# Patient Record
Sex: Female | Born: 1945 | Race: White | Hispanic: No | Marital: Married | Smoking: Former smoker
Health system: Southern US, Community
[De-identification: ages and names within clinical notes are randomized; demographics above are authoritative.]

## PROBLEM LIST (undated history)

## (undated) DIAGNOSIS — R000 Tachycardia, unspecified: Secondary | ICD-10-CM

## (undated) HISTORY — PX: OTHER SURGICAL HISTORY: SHX169

---

## 1971-04-15 HISTORY — PX: OVARIAN CYST REMOVAL: SHX89

## 2000-01-14 ENCOUNTER — Other Ambulatory Visit: Admission: RE | Admit: 2000-01-14 | Discharge: 2000-01-14 | Payer: Self-pay | Admitting: Family Medicine

## 2013-02-24 ENCOUNTER — Ambulatory Visit
Admission: RE | Admit: 2013-02-24 | Discharge: 2013-02-24 | Disposition: A | Discharge status: Home / Self Care | Payer: No Typology Code available for payment source | Source: Ambulatory Visit | Attending: General Practice | Admitting: General Practice

## 2013-02-24 ENCOUNTER — Other Ambulatory Visit: Payer: Self-pay | Admitting: General Practice

## 2013-02-24 DIAGNOSIS — R52 Pain, unspecified: Secondary | ICD-10-CM

## 2013-10-13 ENCOUNTER — Encounter: Payer: Self-pay | Admitting: Internal Medicine

## 2013-10-13 ENCOUNTER — Ambulatory Visit (INDEPENDENT_AMBULATORY_CARE_PROVIDER_SITE_OTHER): Payer: Medicare HMO | Admitting: Internal Medicine

## 2013-10-13 VITALS — BP 106/62 | HR 56 | Temp 97.9°F | Resp 16 | Ht 62.0 in | Wt 101.0 lb

## 2013-10-13 DIAGNOSIS — R7309 Other abnormal glucose: Secondary | ICD-10-CM

## 2013-10-13 DIAGNOSIS — R5381 Other malaise: Secondary | ICD-10-CM

## 2013-10-13 DIAGNOSIS — E559 Vitamin D deficiency, unspecified: Secondary | ICD-10-CM

## 2013-10-13 DIAGNOSIS — Z1331 Encounter for screening for depression: Secondary | ICD-10-CM

## 2013-10-13 DIAGNOSIS — E538 Deficiency of other specified B group vitamins: Secondary | ICD-10-CM

## 2013-10-13 DIAGNOSIS — Z1212 Encounter for screening for malignant neoplasm of rectum: Secondary | ICD-10-CM

## 2013-10-13 DIAGNOSIS — R5383 Other fatigue: Secondary | ICD-10-CM | POA: Insufficient documentation

## 2013-10-13 DIAGNOSIS — R109 Unspecified abdominal pain: Secondary | ICD-10-CM

## 2013-10-13 DIAGNOSIS — E782 Mixed hyperlipidemia: Secondary | ICD-10-CM

## 2013-10-13 DIAGNOSIS — I10 Essential (primary) hypertension: Secondary | ICD-10-CM

## 2013-10-13 DIAGNOSIS — Z79899 Other long term (current) drug therapy: Secondary | ICD-10-CM

## 2013-10-13 LAB — CBC WITH DIFFERENTIAL/PLATELET
BASOS PCT: 1 % (ref 0–1)
Basophils Absolute: 0 10*3/uL (ref 0.0–0.1)
EOS ABS: 0 10*3/uL (ref 0.0–0.7)
EOS PCT: 1 % (ref 0–5)
HCT: 39.6 % (ref 36.0–46.0)
Hemoglobin: 13.8 g/dL (ref 12.0–15.0)
LYMPHS ABS: 0.8 10*3/uL (ref 0.7–4.0)
Lymphocytes Relative: 26 % (ref 12–46)
MCH: 31.4 pg (ref 26.0–34.0)
MCHC: 34.8 g/dL (ref 30.0–36.0)
MCV: 90.2 fL (ref 78.0–100.0)
Monocytes Absolute: 0.4 10*3/uL (ref 0.1–1.0)
Monocytes Relative: 11 % (ref 3–12)
Neutro Abs: 2 10*3/uL (ref 1.7–7.7)
Neutrophils Relative %: 61 % (ref 43–77)
PLATELETS: 184 10*3/uL (ref 150–400)
RBC: 4.39 MIL/uL (ref 3.87–5.11)
RDW: 13 % (ref 11.5–15.5)
WBC: 3.2 10*3/uL — ABNORMAL LOW (ref 4.0–10.5)

## 2013-10-13 LAB — HEMOGLOBIN A1C
Hgb A1c MFr Bld: 5.5 % (ref <5.7)
MEAN PLASMA GLUCOSE: 111 mg/dL (ref <117)

## 2013-10-13 NOTE — Progress Notes (Signed)
Patient ID: Tricia [name], female   DOB: 11-23-1945, 68 y.o.   MRN: [id number]   Annual Screening Comprehensive Examination   This very nice 68 y.o.female presents for screening physical.  Patient c/o fatigue, relates Hx/o anemia, low B12 level and folate. She relates she sees a Restaurant manager, fast food and an associate "Homeopath who "hooks" her up to a machine and diagnoses and treats her for food allergies and toxicities. She relates she is taking several supplements prescribed and supplied by these practioners.   Patient does also report some occasional and intermittent Epigastric discomfort triggered by certain foods. She describes waterbrash and reflux type Sx's. She relates Hx/o prior Dx/o [facility] and gerd and also IBS.     Patient's BP has been controlled and today's BP: 106/62 mmHg. Patient denies any cardiac symptoms as chest pain, palpitations, shortness of breath, dizziness or ankle swelling.    Patient has Hx/o very favorable Lipid risk profile. Patient denies myalgias or other medication SE's. Last cholesterol was 190, Trig 116, HDL 85 & LDL 823 in June 2015.   Patient relates Hx/o elevated blood sugar in the past but last A1c was 5.2% & insulin 13 in June 2014. Patient denies reactive hypoglycemic symptoms, visual blurring, diabetic polys, or paresthesias.   Medication Sig  . VITAMIN C  Take daily.  Marland Kitchen VITAMIN B COMPLEX  Take daily.  Marland Kitchen VITAMIN D  Take 2,000 Int'l Units  daily.  . MULTIVITAMIN  Take  daily.  Marland Kitchen VITAMIN E  Take  daily.   Allergies  Allergen Reactions  . Sulfa Antibiotics     GI problems  . Iodine Rash    Patient states she had a rash with IV contrast  . Penicillins Rash   Neg past medical history.  Past Surgical History  Procedure Laterality Date  . Ovarian cyst removal  1973   Family History  Problem Relation Age of Onset  . Leukemia Mother   . Goiter Mother   . Stroke Father   . Cancer Father   . Heart attack Father    History  Substance Use Topics  .  Smoking status: Never Smoker   . Smokeless tobacco: Not on file  . Alcohol Use: No    ROS Constitutional: Denies fever, chills, weight loss/gain, headaches, insomnia,  night sweats, and change in appetite. fatigue. Eyes: Denies redness, blurred vision, diplopia, discharge, itchy, watery eyes.  ENT: Denies discharge, congestion, post nasal drip, epistaxis, sore throat, earache, hearing loss, dental pain, Tinnitus, Vertigo, Sinus pain, snoring.  Cardio: Denies chest pain, palpitations, irregular heartbeat, syncope, dyspnea, diaphoresis, orthopnea, PND, claudication, edema Respiratory: denies cough, dyspnea, DOE, pleurisy, hoarseness, laryngitis, wheezing.  Gastrointestinal: Denies dysphagia, heartburn, reflux, water brash, pain, cramps, nausea, vomiting, bloating, diarrhea, constipation, hematemesis, melena, hematochezia, jaundice, hemorrhoids Genitourinary: Denies dysuria, frequency, urgency, nocturia, hesitancy, discharge, hematuria, flank pain Breast: Breast lumps, nipple discharge, bleeding.  Musculoskeletal: Denies arthralgia, myalgia, stiffness, Jt. Swelling, pain, limp, and strain/sprain. Denies falls. Skin: Denies puritis, rash, hives, warts, acne, eczema, changing in skin lesion Neuro: No weakness, tremor, incoordination, spasms, paresthesia, pain Psychiatric: Denies confusion, memory loss, sensory loss. Denies Depression. Endocrine: Denies change in weight, skin, hair change, nocturia, and paresthesia, diabetic polys, visual blurring, hyper / hypo glycemic episodes.  Heme/Lymph: No excessive bleeding, bruising, enlarged lymph nodes.  Physical Exam  BP 106/62  P 56  T 97.9 F   R 16  Ht 5\' 2"    Wt 101 lb   BMI 18.47 kg/m2  General Appearance: Well nourished and  in no apparent distress. Eyes: PERRLA, EOMs, conjunctiva no swelling or erythema, normal fundi and vessels. Sinuses: No frontal/maxillary tenderness ENT/Mouth: EACs patent / TMs  nl. Nares clear without erythema,  swelling, mucoid exudates. Oral hygiene is good. No erythema, swelling, or exudate. Tongue normal, non-obstructing. Tonsils not swollen or erythematous. Hearing normal.  Neck: Supple, thyroid normal. No bruits, nodes or JVD. Respiratory: Respiratory effort normal.  BS equal and clear bilateral without rales, rhonci, wheezing or stridor. Cardio: Heart sounds are normal with regular rate and rhythm and no murmurs, rubs or gallops. Peripheral pulses are normal and equal bilaterally without edema. No aortic or femoral bruits. Chest: symmetric with normal excursions and percussion. Breasts: Symmetric, without lumps, nipple discharge, retractions, or fibrocystic changes.  Abdomen: Flat, soft, with bowl sounds. Nontender, no guarding, rebound, hernias, masses, or organomegaly.  Lymphatics: Non tender without lymphadenopathy.  Genitourinary:  Musculoskeletal: Full ROM all peripheral extremities, joint stability, 5/5 strength, and normal gait. Skin: Warm and dry without rashes, lesions, cyanosis, clubbing or  ecchymosis.  Neuro: Cranial nerves intact, reflexes equal bilaterally. Normal muscle tone, no cerebellar symptoms. Sensation intact.  Pysch: Awake and oriented X 3, normal affect, Insight and Judgment appropriate.   Assessment and Plan  1. Annual Screening Examination 2. Fatigue 3. Alleged Hx/o Abnormal glucose 4. Vitamin D Deficiency 5. GERD 6. IBS  Continue prudent diet as discussed, weight control, BP monitoring, regular exercise, and medications. Discussed med's effects and SE's. Screening labs and tests as requested with regular follow-up as recommended.

## 2013-10-13 NOTE — Patient Instructions (Addendum)
Recommend the book "the END of DIETING" by Dr Baker Janus   and the  Book "The END of DIABETES " by Dr Excell Seltzer  At [email or web address] - get book & Audio CD's      Being diabetic has a  300% increased risk for heart attack, stroke, cancer, and alzheimer- type vascular dementia. It is very important that you work harder with diet by avoiding all foods that are white except chicken & fish. Avoid white rice (brown & wild rice is OK), white potatoes (sweetpotatoes in moderation is OK), White bread or wheat bread or anything made out of white flour like bagels, donuts, rolls, buns, biscuits, cakes, pastries, cookies, pizza crust, and pasta (made from white flour & egg whites) - vegetarian pasta or spinach or wheat pasta is OK. Multigrain breads like Arnold's or [employer], or multigrain sandwich thins or flatbreads.  Diet, exercise and weight loss can reverse and cure diabetes in the early stages.  Diet, exercise and weight loss is very important in the control and prevention of complications of diabetes which affects every system in your body, ie. Brain - dementia/stroke, eyes - glaucoma/blindness, heart - heart attack/heart failure, kidneys - dialysis, stomach - gastric paralysis, intestines - malabsorption, nerves - severe painful neuritis, circulation - gangrene & loss of a leg(s), and finally cancer and Alzheimers.    I recommend avoid fried & greasy foods,  sweets/candy, white rice (brown or wild rice or Quinoa is OK), white potatoes (sweet potatoes are OK) - anything made from white flour - bagels, doughnuts, rolls, buns, biscuits,white and wheat breads, pizza crust and traditional pasta made of white flour & egg white(vegetarian pasta or spinach or wheat pasta is OK).  Multi-grain bread is OK - like multi-grain flat bread or sandwich thins. Avoid alcohol in excess. Exercise is also important.    Eat all the vegetables you want - avoid meat, especially red meat and dairy - especially cheese.  Cheese is  the most concentrated form of trans-fats which is the worst thing to clog up our arteries. Veggie cheese is OK which can be found in the fresh produce section at [employer] or [employer] or [place] for Gastroesophageal Reflux Disease When you have gastroesophageal reflux disease (GERD), the foods you eat and your eating habits are very important. Choosing the right foods can help ease the discomfort of GERD. WHAT GENERAL GUIDELINES DO I NEED TO FOLLOW?  Choose fruits, vegetables, whole grains, low-fat dairy products, and low-fat meat, fish, and poultry.  Limit fats such as oils, salad dressings, butter, nuts, and avocado.  Keep a food diary to identify foods that cause symptoms.  Avoid foods that cause reflux. These may be different for different people.  Eat frequent small meals instead of three large meals each day.  Eat your meals slowly, in a relaxed setting.  Limit fried foods.  Cook foods using methods other than frying.  Avoid drinking alcohol.  Avoid drinking large amounts of liquids with your meals.  Avoid bending over or lying down until 2-3 hours after eating. WHAT FOODS ARE NOT RECOMMENDED? The following are some foods and drinks that may worsen your symptoms: Vegetables Tomatoes. Tomato juice. Tomato and spaghetti sauce. Chili peppers. Onion and garlic. Horseradish. Fruits Oranges, grapefruit, and lemon (fruit and juice). Meats High-fat meats, fish, and poultry. This includes hot dogs, ribs, ham, sausage, salami, and bacon. Dairy Whole milk and chocolate milk. Sour cream. Cream. Butter. Ice cream. Cream cheese.  Beverages Coffee  and tea, with or without caffeine. Carbonated beverages or energy drinks. Condiments Hot sauce. Barbecue sauce.  Sweets/Desserts Chocolate and cocoa. Donuts. Peppermint and spearmint. Fats and Oils High-fat foods, including [place] fries and potato chips. Other Vinegar. Strong spices, such as black pepper, white  pepper, red pepper, cayenne, curry powder, cloves, ginger, and chili powder. The items listed above may not be a complete list of foods and beverages to avoid. Contact your dietitian for more information. Document Released: 03/31/2005 Document Revised: 04/05/2013 Document Reviewed: 02/02/2013 [facility] Patient Information 2015 [place], [place]. This information is not intended to replace advice given to you by your health care provider. Make sure you discuss any questions you have with your health care provider. Gastroesophageal Reflux Disease, Adult Gastroesophageal reflux disease (GERD) happens when acid from your stomach flows up into the esophagus. When acid comes in contact with the esophagus, the acid causes soreness (inflammation) in the esophagus. Over time, GERD may create small holes (ulcers) in the lining of the esophagus. CAUSES   Increased body weight. This puts pressure on the stomach, making acid rise from the stomach into the esophagus.  Smoking. This increases acid production in the stomach.  Drinking alcohol. This causes decreased pressure in the lower esophageal sphincter (valve or ring of muscle between the esophagus and stomach), allowing acid from the stomach into the esophagus.  Late evening meals and a full stomach. This increases pressure and acid production in the stomach.  A malformed lower esophageal sphincter. Sometimes, no cause is found. SYMPTOMS   Burning pain in the lower part of the mid-chest behind the breastbone and in the mid-stomach area. This may occur twice a week or more often.  Trouble swallowing.  Sore throat.  Dry cough.  Asthma-like symptoms including chest tightness, shortness of breath, or wheezing. DIAGNOSIS  Your caregiver may be able to diagnose GERD based on your symptoms. In some cases, X-rays and other tests may be done to check for complications or to check the condition of your stomach and esophagus. TREATMENT  Your caregiver may  recommend over-the-counter or prescription medicines to help decrease acid production. Ask your caregiver before starting or adding any new medicines.  HOME CARE INSTRUCTIONS   Change the factors that you can control. Ask your caregiver for guidance concerning weight loss, quitting smoking, and alcohol consumption.  Avoid foods and drinks that make your symptoms worse, such as:  Caffeine or alcoholic drinks.  Chocolate.  Peppermint or mint flavorings.  Garlic and onions.  Spicy foods.  Citrus fruits, such as oranges, lemons, or limes.  Tomato-based foods such as sauce, chili, salsa, and pizza.  Fried and fatty foods.  Avoid lying down for the 3 hours prior to your bedtime or prior to taking a nap.  Eat small, frequent meals instead of large meals.  Wear loose-fitting clothing. Do not wear anything tight around your waist that causes pressure on your stomach.  Raise the head of your bed 6 to 8 inches with wood blocks to help you sleep. Extra pillows will not help.  Only take over-the-counter or prescription medicines for pain, discomfort, or fever as directed by your caregiver.  Do not take aspirin, ibuprofen, or other nonsteroidal anti-inflammatory drugs (NSAIDs). SEEK IMMEDIATE MEDICAL CARE IF:   You have pain in your arms, neck, jaw, teeth, or back.  Your pain increases or changes in intensity or duration.  You develop nausea, vomiting, or sweating (diaphoresis).  You develop shortness of breath, or you faint.  Your vomit is  green, yellow, black, or looks like coffee grounds or blood.  Your stool is red, bloody, or black. These symptoms could be signs of other problems, such as heart disease, gastric bleeding, or esophageal bleeding. MAKE SURE YOU:   Understand these instructions.  Will watch your condition.  Will get help right away if you are not doing well or get worse. Document Released: 01/08/2005 Document Revised: 06/23/2011 Document Reviewed:  10/18/2010 [facility] Patient Information 2015 [place], [place]. This information is not intended to replace advice given to you by your health care provider. Make sure you discuss any questions you have with your health care provider.  Diet and Irritable Bowel Syndrome  No cure has been found for irritable bowel syndrome (IBS). Many options are available to treat the symptoms. Your caregiver will give you the best treatments available for your symptoms. He or she will also encourage you to manage stress and to make changes to your diet. You need to work with your caregiver and Registered Dietician to find the best combination of medicine, diet, counseling, and support to control your symptoms. The following are some diet suggestions. FOODS THAT MAKE IBS WORSE  Fatty foods, such as [place] fries.  Milk products, such as cheese or ice cream.  Chocolate.  Alcohol.  Caffeine (found in coffee and some sodas).  Carbonated drinks, such as soda. If certain foods cause symptoms, you should eat less of them or stop eating them. FOOD JOURNAL   Keep a journal of the foods that seem to cause distress. Write down:  What you are eating during the day and when.  What problems you are having after eating.  When the symptoms occur in relation to your meals.  What foods always make you feel badly.  Take your notes with you to your caregiver to see if you should stop eating certain foods. FOODS THAT MAKE IBS BETTER Fiber reduces IBS symptoms, especially constipation, because it makes stools soft, bulky, and easier to pass. Fiber is found in bran, bread, cereal, beans, fruit, and vegetables. Examples of foods with fiber include:  Apples.  Peaches.  Pears.  Berries.  Figs.  Broccoli, raw.  Cabbage.  Carrots.  Raw peas.  Kidney beans.  Lima beans.  Whole-grain bread.  Whole-grain cereal. Add foods with fiber to your diet a little at a time. This will let your body get used to them.  Too much fiber at once might cause gas and swelling of your abdomen. This can trigger symptoms in a person with IBS. Caregivers usually recommend a diet with enough fiber to produce soft, painless bowel movements. High fiber diets may cause gas and bloating. However, these symptoms often go away within a few weeks, as your body adjusts. In many cases, dietary fiber may lessen IBS symptoms, particularly constipation. However, it may not help pain or diarrhea. High fiber diets keep the colon mildly enlarged (distended) with the added fiber. This may help prevent spasms in the colon. Some forms of fiber also keep water in the stool, thereby preventing hard stools that are difficult to pass.  Besides telling you to eat more foods with fiber, your caregiver may also tell you to get more fiber by taking a fiber pill or drinking water mixed with a special high fiber powder. An example of this is a natural fiber laxative containing psyllium seed.  TIPS  Large meals can cause cramping and diarrhea in people with IBS. If this happens to you, try eating 4 or 5 small meals  a day, or try eating less at each of your usual 3 meals. It may also help if your meals are low in fat and high in carbohydrates. Examples of carbohydrates are pasta, rice, whole-grain breads and cereals, fruits, and vegetables.  If dairy products cause your symptoms to flare up, you can try eating less of those foods. You might be able to handle yogurt better than other dairy products, because it contains bacteria that helps with digestion. Dairy products are an important source of calcium and other nutrients. If you need to avoid dairy products, be sure to talk with a Registered Dietitian about getting these nutrients through other food sources.  Drink enough water and fluids to keep your urine clear or pale yellow. This is important, especially if you have diarrhea. FOR MORE INFORMATION  International Foundation for Functional Gastrointestinal  Disorders: www.iffgd.org  National Digestive Diseases Information Clearinghouse: digestive.[employer].is Document Released: 06/21/2003 Document Revised: 06/23/2011 Document Reviewed: 03/08/2007 [facility] Patient Information 2015 [place], [place]. This information is not intended to replace advice given to you by your health care provider. Make sure you discuss any questions you have with your health care provider. Irritable Bowel Syndrome Irritable bowel syndrome (IBS) is caused by a disturbance of normal bowel function and is a common digestive disorder. You may also hear this condition called spastic colon, mucous colitis, and irritable colon. There is no cure for IBS. However, symptoms often gradually improve or disappear with a good diet, stress management, and medicine. This condition usually appears in late adolescence or early adulthood. Women develop it twice as often as men. CAUSES  After food has been digested and absorbed in the small intestine, waste material is moved into the large intestine, or colon. In the colon, water and salts are absorbed from the undigested products coming from the small intestine. The remaining residue, or fecal material, is held for elimination. Under normal circumstances, gentle, rhythmic contractions of the bowel walls push the fecal material along the colon toward the rectum. In IBS, however, these contractions are irregular and poorly coordinated. The fecal material is either retained too long, resulting in constipation, or expelled too soon, producing diarrhea. SIGNS AND SYMPTOMS  The most common symptom of IBS is abdominal pain. It is often in the lower left side of the abdomen, but it may occur anywhere in the abdomen. The pain comes from spasms of the bowel muscles happening too much and from the buildup of gas and fecal material in the colon. This pain:  Can range from sharp abdominal cramps to a dull, continuous ache.  Often worsens soon after eating.  Is  often relieved by having a bowel movement or passing gas. Abdominal pain is usually accompanied by constipation, but it may also produce diarrhea. The diarrhea often occurs right after a meal or upon waking up in the morning. The stools are often soft, watery, and flecked with mucus. Other symptoms of IBS include:  Bloating.  Loss of appetite.  Heartburn.  Backache.  Dull pain in the arms or shoulders.  Nausea.  Burping.  Vomiting.  Gas. IBS may also cause symptoms that are unrelated to the digestive system, such as:  Fatigue.  Headaches.  Anxiety.  Shortness of breath.  Trouble concentrating.  Dizziness. These symptoms tend to come and go. DIAGNOSIS  The symptoms of IBS may seem like symptoms of other, more serious digestive disorders. Your health care provider may want to perform tests to exclude these disorders.  TREATMENT Many medicines are available to  help correct bowel function or relieve bowel spasms and abdominal pain. Among the medicines available are:  Laxatives for severe constipation and to help restore normal bowel habits.  Specific antidiarrheal medicines to treat severe or lasting diarrhea.  Antispasmodic agents to relieve intestinal cramps. Your health care provider may also decide to treat you with a mild tranquilizer or sedative during unusually stressful periods in your life. Your health care provider may also prescribe antidepressant medicine. The use of this medicine has been shown to reduce pain and other symptoms of IBS. Remember that if any medicine is prescribed for you, you should take it exactly as directed. Make sure your health care provider knows how well it worked for you. HOME CARE INSTRUCTIONS   Take all medicines as directed by your health care provider.  Avoid foods that are high in fat or oils, such as heavy cream, butter, frankfurters, sausage, and other fatty meats.  Avoid foods that make you go to the bathroom, such as fruit,  fruit juice, and dairy products.  Cut out carbonated drinks, chewing gum, and "gassy" foods such as beans and cabbage. This may help relieve bloating and burping.  Eat foods with bran, and drink plenty of liquids with the bran foods. This helps relieve constipation.  Keep track of what foods seem to bring on your symptoms.  Avoid emotionally charged situations or circumstances that produce anxiety.  Start or continue exercising.  Get plenty of rest and sleep. Document Released: 03/31/2005 Document Revised: 04/05/2013 Document Reviewed: 11/19/2007 [facility] Patient Information 2015 [place], [place]. This information is not intended to replace advice given to you by your health care provider. Make sure you discuss any questions you have with your health care provider.

## 2013-10-14 LAB — LIPID PANEL
Cholesterol: 189 mg/dL (ref 0–200)
HDL: 86 mg/dL (ref >39)
LDL Cholesterol: 88 mg/dL (ref 0–99)
Total CHOL/HDL Ratio: 2.2 Ratio
Triglycerides: 74 mg/dL (ref <150)
VLDL: 15 mg/dL (ref 0–40)

## 2013-10-14 LAB — URINALYSIS, MICROSCOPIC ONLY
Bacteria, UA: NONE SEEN
Casts: NONE SEEN
Crystals: NONE SEEN
Squamous Epithelial / LPF: NONE SEEN

## 2013-10-14 LAB — HEPATIC FUNCTION PANEL
ALBUMIN: 4.5 g/dL (ref 3.5–5.2)
ALT: 16 U/L (ref 0–35)
AST: 21 U/L (ref 0–37)
Alkaline Phosphatase: 93 U/L (ref 39–117)
Bilirubin, Direct: 0.1 mg/dL (ref 0.0–0.3)
Indirect Bilirubin: 0.5 mg/dL (ref 0.2–1.2)
Total Bilirubin: 0.6 mg/dL (ref 0.2–1.2)
Total Protein: 6.5 g/dL (ref 6.0–8.3)

## 2013-10-14 LAB — BASIC METABOLIC PANEL WITH GFR
BUN: 13 mg/dL (ref 6–23)
CO2: 29 meq/L (ref 19–32)
CREATININE: 0.73 mg/dL (ref 0.50–1.10)
Calcium: 9.6 mg/dL (ref 8.4–10.5)
Chloride: 106 mEq/L (ref 96–112)
GFR, Est African American: >89 mL/min
GFR, Est Non African American: 85 mL/min
GLUCOSE: 66 mg/dL — AB (ref 70–99)
Potassium: 4.2 mEq/L (ref 3.5–5.3)
Sodium: 141 mEq/L (ref 135–145)

## 2013-10-14 LAB — VITAMIN B12: Vitamin B-12: 486 pg/mL (ref 211–911)

## 2013-10-14 LAB — VITAMIN D 25 HYDROXY (VIT D DEFICIENCY, FRACTURES): Vit D, 25-Hydroxy: 78 ng/mL (ref 30–89)

## 2013-10-14 LAB — INSULIN, FASTING: INSULIN FASTING, SERUM: 8 u[IU]/mL (ref 3–28)

## 2013-10-14 LAB — TSH: TSH: 0.796 u[IU]/mL (ref 0.350–4.500)

## 2013-10-14 LAB — MAGNESIUM: MAGNESIUM: 2.5 mg/dL (ref 1.5–2.5)

## 2013-11-09 ENCOUNTER — Other Ambulatory Visit: Payer: Self-pay | Admitting: *Deleted

## 2013-11-09 DIAGNOSIS — Z1212 Encounter for screening for malignant neoplasm of rectum: Secondary | ICD-10-CM

## 2013-11-09 LAB — POC HEMOCCULT BLD/STL (HOME/3-CARD/SCREEN)
Card #2 Fecal Occult Blod, POC: NEGATIVE
Card #3 Fecal Occult Blood, POC: NEGATIVE
FECAL OCCULT BLD: NEGATIVE

## 2014-07-11 ENCOUNTER — Ambulatory Visit (INDEPENDENT_AMBULATORY_CARE_PROVIDER_SITE_OTHER): Payer: PPO | Admitting: Internal Medicine

## 2014-07-11 ENCOUNTER — Encounter: Payer: Self-pay | Admitting: Internal Medicine

## 2014-07-11 VITALS — BP 128/66 | HR 64 | Temp 97.3°F | Resp 18 | Ht 62.0 in | Wt 102.2 lb

## 2014-07-11 DIAGNOSIS — R079 Chest pain, unspecified: Secondary | ICD-10-CM

## 2014-07-11 DIAGNOSIS — R300 Dysuria: Secondary | ICD-10-CM

## 2014-07-11 LAB — CBC WITH DIFFERENTIAL/PLATELET
Basophils Absolute: 0 10*3/uL (ref 0.0–0.1)
Basophils Relative: 1 % (ref 0–1)
EOS PCT: 4 % (ref 0–5)
Eosinophils Absolute: 0.1 10*3/uL (ref 0.0–0.7)
HCT: 39.8 % (ref 36.0–46.0)
HEMOGLOBIN: 14.1 g/dL (ref 12.0–15.0)
Lymphocytes Relative: 25 % (ref 12–46)
Lymphs Abs: 0.9 10*3/uL (ref 0.7–4.0)
MCH: 33.3 pg (ref 26.0–34.0)
MCHC: 35.4 g/dL (ref 30.0–36.0)
MCV: 93.9 fL (ref 78.0–100.0)
MPV: 8.9 fL (ref 8.6–12.4)
Monocytes Absolute: 0.4 10*3/uL (ref 0.1–1.0)
Monocytes Relative: 10 % (ref 3–12)
NEUTROS ABS: 2.1 10*3/uL (ref 1.7–7.7)
Neutrophils Relative %: 60 % (ref 43–77)
Platelets: 181 10*3/uL (ref 150–400)
RBC: 4.24 MIL/uL (ref 3.87–5.11)
RDW: 12.4 % (ref 11.5–15.5)
WBC: 3.5 10*3/uL — AB (ref 4.0–10.5)

## 2014-07-11 LAB — BASIC METABOLIC PANEL WITH GFR
BUN: 11 mg/dL (ref 6–23)
CHLORIDE: 104 meq/L (ref 96–112)
CO2: 27 mEq/L (ref 19–32)
Calcium: 9.7 mg/dL (ref 8.4–10.5)
Creat: 0.71 mg/dL (ref 0.50–1.10)
GFR, Est Non African American: 88 mL/min
Glucose, Bld: 70 mg/dL (ref 70–99)
POTASSIUM: 4.1 meq/L (ref 3.5–5.3)
Sodium: 141 mEq/L (ref 135–145)

## 2014-07-11 LAB — TROPONIN I: Troponin I: <0.01 ng/mL (ref <0.06)

## 2014-07-11 LAB — HEPATIC FUNCTION PANEL
ALT: 18 U/L (ref 0–35)
AST: 25 U/L (ref 0–37)
Albumin: 4.8 g/dL (ref 3.5–5.2)
Alkaline Phosphatase: 82 U/L (ref 39–117)
Bilirubin, Direct: 0.2 mg/dL (ref 0.0–0.3)
Indirect Bilirubin: 0.5 mg/dL (ref 0.2–1.2)
Total Bilirubin: 0.7 mg/dL (ref 0.2–1.2)
Total Protein: 7.1 g/dL (ref 6.0–8.3)

## 2014-07-11 LAB — LIPASE: Lipase: 48 U/L (ref 0–75)

## 2014-07-11 MED ORDER — RANITIDINE HCL 150 MG PO TABS: 150.0000 mg | ORAL_TABLET | Freq: Two times a day (BID) | ORAL | Status: DC

## 2014-07-11 MED ORDER — SUCRALFATE 1 G PO TABS: 1.0000 g | ORAL_TABLET | Freq: Four times a day (QID) | ORAL | Status: DC

## 2014-07-11 NOTE — Patient Instructions (Addendum)
Chest Pain (Nonspecific) It is often hard to give a specific diagnosis for the cause of chest pain. There is always a chance that your pain could be related to something serious, such as a heart attack or a blood clot in the lungs. You need to follow up with your health care provider for further evaluation. CAUSES   Heartburn.  Pneumonia or bronchitis.  Anxiety or stress.  Inflammation around your heart (pericarditis) or lung (pleuritis or pleurisy).  A blood clot in the lung.  A collapsed lung (pneumothorax). It can develop suddenly on its own (spontaneous pneumothorax) or from trauma to the chest.  Shingles infection (herpes zoster virus). The chest wall is composed of bones, muscles, and cartilage. Any of these can be the source of the pain.  The bones can be bruised by injury.  The muscles or cartilage can be strained by coughing or overwork.  The cartilage can be affected by inflammation and become sore (costochondritis). DIAGNOSIS  Lab tests or other studies may be needed to find the cause of your pain. Your health care provider may have you take a test called an ambulatory electrocardiogram (ECG). An ECG records your heartbeat patterns over a 24-hour period. You may also have other tests, such as:  Transthoracic echocardiogram (TTE). During echocardiography, sound waves are used to evaluate how blood flows through your heart.  Transesophageal echocardiogram (TEE).  Cardiac monitoring. This allows your health care provider to monitor your heart rate and rhythm in real time.  Holter monitor. This is a portable device that records your heartbeat and can help diagnose heart arrhythmias. It allows your health care provider to track your heart activity for several days, if needed.  Stress tests by exercise or by giving medicine that makes the heart beat faster. TREATMENT   Treatment depends on what may be causing your chest pain. Treatment may include:  Acid blockers for  heartburn.  Anti-inflammatory medicine.  Pain medicine for inflammatory conditions.  Antibiotics if an infection is present.  You may be advised to change lifestyle habits. This includes stopping smoking and avoiding alcohol, caffeine, and chocolate.  You may be advised to keep your head raised (elevated) when sleeping. This reduces the chance of acid going backward from your stomach into your esophagus. Most of the time, nonspecific chest pain will improve within 2-3 days with rest and mild pain medicine.  HOME CARE INSTRUCTIONS   If antibiotics were prescribed, take them as directed. Finish them even if you start to feel better.  For the next few days, avoid physical activities that bring on chest pain. Continue physical activities as directed.  Do not use any tobacco products, including cigarettes, chewing tobacco, or electronic cigarettes.  Avoid drinking alcohol.  Only take medicine as directed by your health care provider.  Follow your health care provider's suggestions for further testing if your chest pain does not go away.  Keep any follow-up appointments you made. If you do not go to an appointment, you could develop lasting (chronic) problems with pain. If there is any problem keeping an appointment, call to reschedule. SEEK MEDICAL CARE IF:   Your chest pain does not go away, even after treatment.  You have a rash with blisters on your chest.  You have a fever. SEEK IMMEDIATE MEDICAL CARE IF:   You have increased chest pain or pain that spreads to your arm, neck, jaw, back, or abdomen.  You have shortness of breath.  You have an increasing cough, or you cough  up blood.  You have severe back or abdominal pain.  You feel nauseous or vomit.  You have severe weakness.  You faint.  You have chills. This is an emergency. Do not wait to see if the pain will go away. Get medical help at once. Call your local emergency services (911 in [place] Do not drive  yourself to the hospital. MAKE SURE YOU:   Understand these instructions.  Will watch your condition.  Will get help right away if you are not doing well or get worse. Document Released: 01/08/2005 Document Revised: 04/05/2013 Document Reviewed: 11/04/2007 [facility] Patient Information 2015 [place], [place]. This information is not intended to replace advice given to you by your health care provider. Make sure you discuss any questions you have with your health care provider.  Gastroesophageal Reflux Disease, Adult Gastroesophageal reflux disease (GERD) happens when acid from your stomach flows up into the esophagus. When acid comes in contact with the esophagus, the acid causes soreness (inflammation) in the esophagus. Over time, GERD may create small holes (ulcers) in the lining of the esophagus. CAUSES   Increased body weight. This puts pressure on the stomach, making acid rise from the stomach into the esophagus.  Smoking. This increases acid production in the stomach.  Drinking alcohol. This causes decreased pressure in the lower esophageal sphincter (valve or ring of muscle between the esophagus and stomach), allowing acid from the stomach into the esophagus.  Late evening meals and a full stomach. This increases pressure and acid production in the stomach.  A malformed lower esophageal sphincter. Sometimes, no cause is found. SYMPTOMS   Burning pain in the lower part of the mid-chest behind the breastbone and in the mid-stomach area. This may occur twice a week or more often.  Trouble swallowing.  Sore throat.  Dry cough.  Asthma-like symptoms including chest tightness, shortness of breath, or wheezing. DIAGNOSIS  Your caregiver may be able to diagnose GERD based on your symptoms. In some cases, X-rays and other tests may be done to check for complications or to check the condition of your stomach and esophagus. TREATMENT  Your caregiver may recommend over-the-counter or  prescription medicines to help decrease acid production. Ask your caregiver before starting or adding any new medicines.  HOME CARE INSTRUCTIONS   Change the factors that you can control. Ask your caregiver for guidance concerning weight loss, quitting smoking, and alcohol consumption.  Avoid foods and drinks that make your symptoms worse, such as:  Caffeine or alcoholic drinks.  Chocolate.  Peppermint or mint flavorings.  Garlic and onions.  Spicy foods.  Citrus fruits, such as oranges, lemons, or limes.  Tomato-based foods such as sauce, chili, salsa, and pizza.  Fried and fatty foods.  Avoid lying down for the 3 hours prior to your bedtime or prior to taking a nap.  Eat small, frequent meals instead of large meals.  Wear loose-fitting clothing. Do not wear anything tight around your waist that causes pressure on your stomach.  Raise the head of your bed 6 to 8 inches with wood blocks to help you sleep. Extra pillows will not help.  Only take over-the-counter or prescription medicines for pain, discomfort, or fever as directed by your caregiver.  Do not take aspirin, ibuprofen, or other nonsteroidal anti-inflammatory drugs (NSAIDs). SEEK IMMEDIATE MEDICAL CARE IF:   You have pain in your arms, neck, jaw, teeth, or back.  Your pain increases or changes in intensity or duration.  You develop nausea, vomiting, or sweating (  diaphoresis).  You develop shortness of breath, or you faint.  Your vomit is green, yellow, black, or looks like coffee grounds or blood.  Your stool is red, bloody, or black. These symptoms could be signs of other problems, such as heart disease, gastric bleeding, or esophageal bleeding. MAKE SURE YOU:   Understand these instructions.  Will watch your condition.  Will get help right away if you are not doing well or get worse. Document Released: 01/08/2005 Document Revised: 06/23/2011 Document Reviewed: 10/18/2010 [facility] Patient  Information 2015 [place], [place]. This information is not intended to replace advice given to you by your health care provider. Make sure you discuss any questions you have with your health care provider.  Sucralfate tablets What is this medicine? SUCRALFATE (SOO kral fate) helps to treat ulcers of the intestine. This medicine may be used for other purposes; ask your health care provider or pharmacist if you have questions. COMMON BRAND NAME(S): Carafate What should I tell my health care provider before I take this medicine? They need to know if you have any of these conditions: -kidney disease -an unusual or allergic reaction to sucralfate, other medicines, foods, dyes, or preservatives -pregnant or trying to get pregnant -breast-feeding How should I use this medicine? Take this medicine by mouth with a glass of water. Follow the directions on the prescription label. This medicine works best if you take it on an empty stomach, 1 hour before meals. Take your doses at regular intervals. Do not take your medicine more often than directed. Do not stop taking except on your doctor's advice. Talk to your pediatrician regarding the use of this medicine in children. Special care may be needed. Overdosage: If you think you have taken too much of this medicine contact a poison control center or emergency room at once. NOTE: This medicine is only for you. Do not share this medicine with others. What if I miss a dose? If you miss a dose, take it as soon as you can. If it is almost time for your next dose, take only that dose. Do not take double or extra doses. What may interact with this medicine? -antacid -cimetidine -digoxin -ketoconazole -phenytoin -quinidine -ranitidine -some antibiotics like ciprofloxacin, norfloxacin, and ofloxacin -theophylline -thyroid hormones -warfarin This list may not describe all possible interactions. Give your health care provider a list of all the medicines,  herbs, non-prescription drugs, or dietary supplements you use. Also tell them if you smoke, drink alcohol, or use illegal drugs. Some items may interact with your medicine. What should I watch for while using this medicine? Visit your doctor or health care professional for regular check ups. Let your doctor know if your symptoms do not improve or if you feel worse. Antacids should not be taken within one half hour before or after this medicine. What side effects may I notice from receiving this medicine? Side effects that you should report to your doctor or health care professional as soon as possible: -allergic reactions like skin rash, itching or hives, swelling of the face, lips, or tongue -difficulty breathing Side effects that usually do not require medical attention (report to your doctor or health care professional if they continue or are bothersome): -back pain -constipation -drowsy, dizzy -dry mouth -headache -stomach upset, gas -trouble sleeping This list may not describe all possible side effects. Call your doctor for medical advice about side effects. You may report side effects to FDA at 1-800-FDA-1088. Where should I keep my medicine? Keep out of the  reach of children. Store at room temperature between 15 and 30 degrees C (59 and 86 degrees F). Keep container tightly closed. Throw away any unused medicine after the expiration date. NOTE: This sheet is a summary. It may not cover all possible information. If you have questions about this medicine, talk to your doctor, pharmacist, or health care provider.  2015, [employer]. (2007-12-01 15:46:20)

## 2014-07-11 NOTE — Progress Notes (Signed)
Subjective:    Patient ID: Tricia [name], female    DOB: 11-02-1945, 69 y.o.   MRN: [id number]  Chest Pain  Associated symptoms include abdominal pain and shortness of breath. Pertinent negatives include no cough, dizziness, fever, nausea, numbness, palpitations or vomiting.  Gastrophageal Reflux She complains of abdominal pain and chest pain. She reports no coughing, no nausea or no wheezing. Associated symptoms include fatigue.   Patient is a 69 y.o. Female who presents to the office for evaluation of chest pain and belching.  She reports that it started at 8:15.  She reports that the pain was in her epigastric area and was constant but then went away.  She reports the pain as a dull ache.  It was worse with walking.  She reports no other aggravating factors to her pain.  She sometimes feels that she needs to take a deep breath.  She reports some pain in the past but nothing like this.  She reports a history of arrhythmias.  She reports that she tried to relax and take deep breaths to calm down.  Patient reports her father had an MI in his 80s.  Patient has no heart history.  She has been a light social smoker in the past but she no longer smokes.  She reports no history of traveling, swelling in her legs, no recent casting immobilization or being bed ridden.  No personal history of cancer.   She reports that her pain is completely gone now.  It was a 6/10 earlier today.   Patient also reports cloudy colored urine and some frequency.  She is concerned she may have a UTI.      Review of Systems  Constitutional: Positive for fatigue. Negative for fever and chills.  Respiratory: Positive for shortness of breath. Negative for cough, chest tightness and wheezing.   Cardiovascular: Positive for chest pain. Negative for palpitations and leg swelling.  Gastrointestinal: Positive for abdominal pain. Negative for nausea, vomiting, diarrhea, constipation, blood in stool and anal bleeding.   Belching  Genitourinary: Negative.   Neurological: Negative for dizziness, light-headedness and numbness.       Objective:   Physical Exam  Constitutional: She is oriented to person, place, and time. She appears well-developed and well-nourished. No distress.  HENT:  Head: Normocephalic and atraumatic.  Mouth/Throat: Oropharynx is clear and moist. No oropharyngeal exudate.  Eyes: Conjunctivae and EOM are normal. Pupils are equal, round, and reactive to light. No scleral icterus.  Neck: Normal range of motion. Neck supple. No JVD present. No thyromegaly present.  Cardiovascular: Regular rhythm, normal heart sounds and intact distal pulses.  Bradycardia present.  Exam reveals no gallop and no friction rub.   No murmur heard. Pulmonary/Chest: Effort normal and breath sounds normal. No respiratory distress. She has no wheezes. She has no rales. She exhibits tenderness (minimal left sided chest tenderness to palpation).  Abdominal: Soft. Bowel sounds are normal. She exhibits no distension and no mass. There is no tenderness. There is no rebound and no guarding.  Musculoskeletal: Normal range of motion.  Lymphadenopathy:    She has no cervical adenopathy.  Neurological: She is alert and oriented to person, place, and time.  Skin: Skin is warm and dry. She is not diaphoretic.  Psychiatric: She has a normal mood and affect. Her behavior is normal. Judgment and thought content normal.  Nursing note and vitals reviewed.         Assessment & Plan:    1. Chest pain,  unspecified chest pain type Patient presents to the office with chest pain and mild epigastric abdominal pain.  She has a history of acid reflux and has eaten chocolate recently which is a trigger for her reflux.  EKG shows no changes or evidence for STEMI vs. NSTEMI.  Patient to have lab work done stat.  Low concern for ACS at this time.  She is to go to the ER for any worsening shortness of breath and chest pain.  Low concern for  PE.  Suspect that this is likely secondary to GERD but will check labs.  Will try ranitidine and carafate for reflux.    - EKG 12-Lead - CBC with Differential/Platelet - BASIC METABOLIC PANEL WITH GFR - Troponin I - Lipase - Hepatic function panel

## 2014-07-11 NOTE — Addendum Note (Signed)
Addended by: Starlyn Skeans A on: 07/11/2014 02:06 PM   Modules accepted: Orders

## 2014-07-12 LAB — URINALYSIS, ROUTINE W REFLEX MICROSCOPIC
Bilirubin Urine: NEGATIVE
GLUCOSE, UA: NEGATIVE mg/dL
Hgb urine dipstick: NEGATIVE
Ketones, ur: NEGATIVE mg/dL
Nitrite: NEGATIVE
Protein, ur: NEGATIVE mg/dL
SPECIFIC GRAVITY, URINE: 1.009 (ref 1.005–1.030)
Urobilinogen, UA: 0.2 mg/dL (ref 0.0–1.0)
pH: 7.5 (ref 5.0–8.0)

## 2014-07-12 LAB — URINE CULTURE
Colony Count: NO GROWTH
ORGANISM ID, BACTERIA: NO GROWTH

## 2014-07-12 LAB — URINALYSIS, MICROSCOPIC ONLY
BACTERIA UA: NONE SEEN
CASTS: NONE SEEN
Crystals: NONE SEEN
Squamous Epithelial / LPF: NONE SEEN

## 2014-07-31 ENCOUNTER — Other Ambulatory Visit: Payer: Self-pay | Admitting: *Deleted

## 2014-07-31 ENCOUNTER — Ambulatory Visit (HOSPITAL_COMMUNITY)
Admission: RE | Admit: 2014-07-31 | Discharge: 2014-07-31 | Disposition: A | Discharge status: Home / Self Care | Payer: PPO | Source: Ambulatory Visit | Attending: Internal Medicine | Admitting: Internal Medicine

## 2014-07-31 DIAGNOSIS — M25522 Pain in left elbow: Secondary | ICD-10-CM

## 2014-08-24 ENCOUNTER — Other Ambulatory Visit: Payer: Self-pay | Admitting: Internal Medicine

## 2014-08-24 ENCOUNTER — Other Ambulatory Visit: Payer: Self-pay

## 2014-08-24 ENCOUNTER — Telehealth: Payer: Self-pay

## 2014-08-24 DIAGNOSIS — S59902A Unspecified injury of left elbow, initial encounter: Secondary | ICD-10-CM

## 2014-08-24 DIAGNOSIS — R0789 Other chest pain: Secondary | ICD-10-CM

## 2014-08-24 DIAGNOSIS — W19XXXA Unspecified fall, initial encounter: Secondary | ICD-10-CM

## 2014-08-24 DIAGNOSIS — S6992XA Unspecified injury of left wrist, hand and finger(s), initial encounter: Secondary | ICD-10-CM

## 2014-08-24 DIAGNOSIS — Y92009 Unspecified place in unspecified non-institutional (private) residence as the place of occurrence of the external cause: Principal | ICD-10-CM

## 2014-08-24 NOTE — Telephone Encounter (Signed)
Pt called c/o left side pain and thinks her ribs may be fractured. Pt fell doing yard work on Saturday. Per Dr.McKeown, pt needs to go for xray. Pt aware.

## 2014-08-25 ENCOUNTER — Encounter (INDEPENDENT_AMBULATORY_CARE_PROVIDER_SITE_OTHER): Payer: Self-pay

## 2014-08-25 ENCOUNTER — Ambulatory Visit (HOSPITAL_COMMUNITY)
Admission: RE | Admit: 2014-08-25 | Discharge: 2014-08-25 | Disposition: A | Discharge status: Home / Self Care | Payer: PPO | Source: Ambulatory Visit | Attending: Internal Medicine | Admitting: Internal Medicine

## 2014-08-25 ENCOUNTER — Other Ambulatory Visit: Payer: Self-pay | Admitting: Internal Medicine

## 2014-08-25 DIAGNOSIS — S6982XA Other specified injuries of left wrist, hand and finger(s), initial encounter: Secondary | ICD-10-CM | POA: Diagnosis not present

## 2014-08-25 DIAGNOSIS — M25522 Pain in left elbow: Secondary | ICD-10-CM | POA: Diagnosis not present

## 2014-08-25 DIAGNOSIS — W19XXXA Unspecified fall, initial encounter: Secondary | ICD-10-CM | POA: Diagnosis not present

## 2014-08-25 DIAGNOSIS — R0789 Other chest pain: Secondary | ICD-10-CM

## 2014-08-25 DIAGNOSIS — M25532 Pain in left wrist: Secondary | ICD-10-CM | POA: Insufficient documentation

## 2014-08-25 DIAGNOSIS — S59902A Unspecified injury of left elbow, initial encounter: Secondary | ICD-10-CM

## 2014-08-25 DIAGNOSIS — S6992XA Unspecified injury of left wrist, hand and finger(s), initial encounter: Secondary | ICD-10-CM

## 2014-09-01 ENCOUNTER — Other Ambulatory Visit: Payer: Self-pay | Admitting: Internal Medicine

## 2014-10-17 ENCOUNTER — Encounter: Payer: Self-pay | Admitting: Internal Medicine

## 2014-10-17 ENCOUNTER — Ambulatory Visit (INDEPENDENT_AMBULATORY_CARE_PROVIDER_SITE_OTHER): Payer: PPO | Admitting: Internal Medicine

## 2014-10-17 VITALS — BP 112/64 | HR 64 | Temp 97.4°F | Resp 16 | Ht 62.0 in | Wt 99.0 lb

## 2014-10-17 DIAGNOSIS — Z9181 History of falling: Secondary | ICD-10-CM

## 2014-10-17 DIAGNOSIS — R030 Elevated blood-pressure reading, without diagnosis of hypertension: Secondary | ICD-10-CM

## 2014-10-17 DIAGNOSIS — Z0000 Encounter for general adult medical examination without abnormal findings: Secondary | ICD-10-CM

## 2014-10-17 DIAGNOSIS — Z0001 Encounter for general adult medical examination with abnormal findings: Secondary | ICD-10-CM

## 2014-10-17 DIAGNOSIS — E7800 Pure hypercholesterolemia, unspecified: Secondary | ICD-10-CM

## 2014-10-17 DIAGNOSIS — Z1212 Encounter for screening for malignant neoplasm of rectum: Secondary | ICD-10-CM

## 2014-10-17 DIAGNOSIS — R6889 Other general symptoms and signs: Secondary | ICD-10-CM

## 2014-10-17 DIAGNOSIS — Z681 Body mass index (BMI) 19 or less, adult: Secondary | ICD-10-CM

## 2014-10-17 DIAGNOSIS — E559 Vitamin D deficiency, unspecified: Secondary | ICD-10-CM

## 2014-10-17 DIAGNOSIS — IMO0001 Reserved for inherently not codable concepts without codable children: Secondary | ICD-10-CM

## 2014-10-17 DIAGNOSIS — Z1331 Encounter for screening for depression: Secondary | ICD-10-CM

## 2014-10-17 DIAGNOSIS — Z79899 Other long term (current) drug therapy: Secondary | ICD-10-CM

## 2014-10-17 DIAGNOSIS — R7309 Other abnormal glucose: Secondary | ICD-10-CM

## 2014-10-17 LAB — BASIC METABOLIC PANEL WITH GFR
BUN: 12 mg/dL (ref 6–23)
CHLORIDE: 104 meq/L (ref 96–112)
CO2: 28 mEq/L (ref 19–32)
Calcium: 9.6 mg/dL (ref 8.4–10.5)
Creat: 0.72 mg/dL (ref 0.50–1.10)
GFR, EST NON AFRICAN AMERICAN: 86 mL/min
GFR, Est African American: >89 mL/min
Glucose, Bld: 78 mg/dL (ref 70–99)
POTASSIUM: 4 meq/L (ref 3.5–5.3)
Sodium: 140 mEq/L (ref 135–145)

## 2014-10-17 LAB — HEPATIC FUNCTION PANEL
ALT: 17 U/L (ref 0–35)
AST: 22 U/L (ref 0–37)
Albumin: 4.6 g/dL (ref 3.5–5.2)
Alkaline Phosphatase: 83 U/L (ref 39–117)
BILIRUBIN DIRECT: 0.1 mg/dL (ref 0.0–0.3)
Indirect Bilirubin: 0.5 mg/dL (ref 0.2–1.2)
Total Bilirubin: 0.6 mg/dL (ref 0.2–1.2)
Total Protein: 6.6 g/dL (ref 6.0–8.3)

## 2014-10-17 LAB — LIPID PANEL
CHOLESTEROL: 175 mg/dL (ref 0–200)
HDL: 101 mg/dL (ref >=46)
LDL CALC: 66 mg/dL (ref 0–99)
Total CHOL/HDL Ratio: 1.7 Ratio
Triglycerides: 40 mg/dL (ref <150)
VLDL: 8 mg/dL (ref 0–40)

## 2014-10-17 LAB — CBC WITH DIFFERENTIAL/PLATELET
Basophils Absolute: 0 10*3/uL (ref 0.0–0.1)
Basophils Relative: 0 % (ref 0–1)
EOS ABS: 0.1 10*3/uL (ref 0.0–0.7)
Eosinophils Relative: 2 % (ref 0–5)
HEMATOCRIT: 41.2 % (ref 36.0–46.0)
Hemoglobin: 14 g/dL (ref 12.0–15.0)
LYMPHS ABS: 1 10*3/uL (ref 0.7–4.0)
Lymphocytes Relative: 33 % (ref 12–46)
MCH: 32.1 pg (ref 26.0–34.0)
MCHC: 34 g/dL (ref 30.0–36.0)
MCV: 94.5 fL (ref 78.0–100.0)
MPV: 9.6 fL (ref 8.6–12.4)
Monocytes Absolute: 0.3 10*3/uL (ref 0.1–1.0)
Monocytes Relative: 9 % (ref 3–12)
Neutro Abs: 1.7 10*3/uL (ref 1.7–7.7)
Neutrophils Relative %: 56 % (ref 43–77)
Platelets: 182 10*3/uL (ref 150–400)
RBC: 4.36 MIL/uL (ref 3.87–5.11)
RDW: 12.4 % (ref 11.5–15.5)
WBC: 3.1 10*3/uL — ABNORMAL LOW (ref 4.0–10.5)

## 2014-10-17 LAB — HEMOGLOBIN A1C
HEMOGLOBIN A1C: 5.5 % (ref <5.7)
Mean Plasma Glucose: 111 mg/dL (ref <117)

## 2014-10-17 LAB — MAGNESIUM: Magnesium: 2.4 mg/dL (ref 1.5–2.5)

## 2014-10-17 LAB — TSH: TSH: 1.511 u[IU]/mL (ref 0.350–4.500)

## 2014-10-17 NOTE — Patient Instructions (Signed)
Recommend Adult Low dose Aspirin or baby Aspirin 81 mg daily   To reduce risk of Colon Cancer 20 %,   Skin Cancer 26 % ,   Melanoma 46%   and   Pancreatic cancer 60%  ++++++++++++++++++  Vitamin D goal is between 70-100.   Please make sure that you are taking your Vitamin D as directed.   It is very important as a natural anti-inflammatory   helping hair, skin, and nails, as well as reducing stroke and heart attack risk.   It helps your bones and helps with mood.  It also decreases numerous cancer risks so please take it as directed.   Low Vit D is associated with a 200-300% higher risk for CANCER   and 200-300% higher risk for HEART   ATTACK  &  STROKE.    ......................................  It is also associated with higher death rate at younger ages,   autoimmune diseases like Rheumatoid arthritis, Lupus, Multiple Sclerosis.     Also many other serious conditions, like depression, Alzheimer's  Dementia, infertility, muscle aches, fatigue, fibromyalgia - just to name a few.  +++++++++++++++++++    Recommend the book "The END of DIETING" by Dr Joel Fuhrman   & the book "The END of DIABETES " by Dr Joel Fuhrman  At [employer] - get book & Audio CD's     Being diabetic has a  300% increased risk for heart attack, stroke, cancer, and alzheimer- type vascular dementia. It is very important that you work harder with diet by avoiding all foods that are white. Avoid white rice (brown & wild rice is OK), white potatoes (sweetpotatoes in moderation is OK), White bread or wheat bread or anything made out of white flour like bagels, donuts, rolls, buns, biscuits, cakes, pastries, cookies, pizza crust, and pasta (made from white flour & egg whites) - vegetarian pasta or spinach or wheat pasta is OK. Multigrain breads like Arnold's or [employer], or multigrain sandwich thins or flatbreads.  Diet, exercise and weight loss can reverse and cure diabetes in the early  stages.  Diet, exercise and weight loss is very important in the control and prevention of complications of diabetes which affects every system in your body, ie. Brain - dementia/stroke, eyes - glaucoma/blindness, heart - heart attack/heart failure, kidneys - dialysis, stomach - gastric paralysis, intestines - malabsorption, nerves - severe painful neuritis, circulation - gangrene & loss of a leg(s), and finally cancer and Alzheimers.    I recommend avoid fried & greasy foods,  sweets/candy, white rice (brown or wild rice or Quinoa is OK), white potatoes (sweet potatoes are OK) - anything made from white flour - bagels, doughnuts, rolls, buns, biscuits,white and wheat breads, pizza crust and traditional pasta made of white flour & egg white(vegetarian pasta or spinach or wheat pasta is OK).  Multi-grain bread is OK - like multi-grain flat bread or sandwich thins. Avoid alcohol in excess. Exercise is also important.    Eat all the vegetables you want - avoid meat, especially red meat and dairy - especially cheese.  Cheese is the most concentrated form of trans-fats which is the worst thing to clog up our arteries. Veggie cheese is OK which can be found in the fresh produce section at [employer] or [employer] or [employer]  ++++++++++++++++++++++++++  Preventive Care for Adults  A healthy lifestyle and preventive care can promote health and wellness. Preventive health guidelines for women include the following key practices.  A routine yearly physical is   a good way to check with your health care provider about your health and preventive screening. It is a chance to share any concerns and updates on your health and to receive a thorough exam.  Visit your dentist for a routine exam and preventive care every 6 months. Brush your teeth twice a day and floss once a day. Good oral hygiene prevents tooth decay and gum disease.  The frequency of eye exams is based on your age, health, family medical  history, use of contact lenses, and other factors. Follow your health care provider's recommendations for frequency of eye exams.  Eat a healthy diet. Foods like vegetables, fruits, whole grains, low-fat dairy products, and lean protein foods contain the nutrients you need without too many calories. Decrease your intake of foods high in solid fats, added sugars, and salt. Eat the right amount of calories for you.Get information about a proper diet from your health care provider, if necessary.  Regular physical exercise is one of the most important things you can do for your health. Most adults should get at least 150 minutes of moderate-intensity exercise (any activity that increases your heart rate and causes you to sweat) each week. In addition, most adults need muscle-strengthening exercises on 2 or more days a week.  Maintain a healthy weight. The body mass index (BMI) is a screening tool to identify possible weight problems. It provides an estimate of body fat based on height and weight. Your health care provider can find your BMI and can help you achieve or maintain a healthy weight.For adults 20 years and older:  A BMI below 18.5 is considered underweight.  A BMI of 18.5 to 24.9 is normal.  A BMI of 25 to 29.9 is considered overweight.  A BMI of 30 and above is considered obese.  Maintain normal blood lipids and cholesterol levels by exercising and minimizing your intake of saturated fat. Eat a balanced diet with plenty of fruit and vegetables. If your lipid or cholesterol levels are high, you are over 50, or you are at high risk for heart disease, you may need your cholesterol levels checked more frequently.Ongoing high lipid and cholesterol levels should be treated with medicines if diet and exercise are not working.  If you smoke, find out from your health care provider how to quit. If you do not use tobacco, do not start.  Lung cancer screening is recommended for adults aged 55-80  years who are at high risk for developing lung cancer because of a history of smoking. A yearly low-dose CT scan of the lungs is recommended for people who have at least a 30-pack-year history of smoking and are a current smoker or have quit within the past 15 years. A pack year of smoking is smoking an average of 1 pack of cigarettes a day for 1 year (for example: 1 pack a day for 30 years or 2 packs a day for 15 years). Yearly screening should continue until the smoker has stopped smoking for at least 15 years. Yearly screening should be stopped for people who develop a health problem that would prevent them from having lung cancer treatment.  Avoid use of street drugs. Do not share needles with anyone. Ask for help if you need support or instructions about stopping the use of drugs.  High blood pressure causes heart disease and increases the risk of stroke.  Ongoing high blood pressure should be treated with medicines if weight loss and exercise do not work.  If   you are 55-79 years old, ask your health care provider if you should take aspirin to prevent strokes.  Diabetes screening involves taking a blood sample to check your fasting blood sugar level. This should be done once every 3 years, after age 45, if you are within normal weight and without risk factors for diabetes. Testing should be considered at a younger age or be carried out more frequently if you are overweight and have at least 1 risk factor for diabetes.  Breast cancer screening is essential preventive care for women. You should practice "breast self-awareness." This means understanding the normal appearance and feel of your breasts and may include breast self-examination. Any changes detected, no matter how small, should be reported to a health care provider. Women in their 20s and 30s should have a clinical breast exam (CBE) by a health care provider as part of a regular health exam every 1 to 3 years. After age 40, women should have a  CBE every year. Starting at age 40, women should consider having a mammogram (breast X-ray test) every year. Women who have a family history of breast cancer should talk to their health care provider about genetic screening. Women at a high risk of breast cancer should talk to their health care providers about having an MRI and a mammogram every year.  Breast cancer gene (BRCA)-related cancer risk assessment is recommended for women who have family members with BRCA-related cancers. BRCA-related cancers include breast, ovarian, tubal, and peritoneal cancers. Having family members with these cancers may be associated with an increased risk for harmful changes (mutations) in the breast cancer genes BRCA1 and BRCA2. Results of the assessment will determine the need for genetic counseling and BRCA1 and BRCA2 testing.  Routine pelvic exams to screen for cancer are no longer recommended for nonpregnant women who are considered low risk for cancer of the pelvic organs (ovaries, uterus, and vagina) and who do not have symptoms. Ask your health care provider if a screening pelvic exam is right for you.  If you have had past treatment for cervical cancer or a condition that could lead to cancer, you need Pap tests and screening for cancer for at least 20 years after your treatment. If Pap tests have been discontinued, your risk factors (such as having a new sexual partner) need to be reassessed to determine if screening should be resumed. Some women have medical problems that increase the chance of getting cervical cancer. In these cases, your health care provider may recommend more frequent screening and Pap tests.    Colorectal cancer can be detected and often prevented. Most routine colorectal cancer screening begins at the age of 50 years and continues through age 75 years. However, your health care provider may recommend screening at an earlier age if you have risk factors for colon cancer. On a yearly basis,  your health care provider may provide home test kits to check for hidden blood in the stool. Use of a small camera at the end of a tube, to directly examine the colon (sigmoidoscopy or colonoscopy), can detect the earliest forms of colorectal cancer. Talk to your health care provider about this at age 50, when routine screening begins. Direct exam of the colon should be repeated every 5-10 years through age 75 years, unless early forms of pre-cancerous polyps or small growths are found.  Osteoporosis is a disease in which the bones lose minerals and strength with aging. This can result in serious bone fractures or breaks. The   risk of osteoporosis can be identified using a bone density scan. Women ages 65 years and over and women at risk for fractures or osteoporosis should discuss screening with their health care providers. Ask your health care provider whether you should take a calcium supplement or vitamin D to reduce the rate of osteoporosis.  Menopause can be associated with physical symptoms and risks. Hormone replacement therapy is available to decrease symptoms and risks. You should talk to your health care provider about whether hormone replacement therapy is right for you.  Use sunscreen. Apply sunscreen liberally and repeatedly throughout the day. You should seek shade when your shadow is shorter than you. Protect yourself by wearing long sleeves, pants, a wide-brimmed hat, and sunglasses year round, whenever you are outdoors.  Once a month, do a whole body skin exam, using a mirror to look at the skin on your back. Tell your health care provider of new moles, moles that have irregular borders, moles that are larger than a pencil eraser, or moles that have changed in shape or color.  Stay current with required vaccines (immunizations).  Influenza vaccine. All adults should be immunized every year.  Tetanus, diphtheria, and acellular pertussis (Td, Tdap) vaccine. Pregnant women should receive  1 dose of Tdap vaccine during each pregnancy. The dose should be obtained regardless of the length of time since the last dose. Immunization is preferred during the 27th-36th week of gestation. An adult who has not previously received Tdap or who does not know her vaccine status should receive 1 dose of Tdap. This initial dose should be followed by tetanus and diphtheria toxoids (Td) booster doses every 10 years. Adults with an unknown or incomplete history of completing a 3-dose immunization series with Td-containing vaccines should begin or complete a primary immunization series including a Tdap dose. Adults should receive a Td booster every 10 years.    Zoster vaccine. One dose is recommended for adults aged 60 years or older unless certain conditions are present.    Pneumococcal 13-valent conjugate (PCV13) vaccine. When indicated, a person who is uncertain of her immunization history and has no record of immunization should receive the PCV13 vaccine. An adult aged 19 years or older who has certain medical conditions and has not been previously immunized should receive 1 dose of PCV13 vaccine. This PCV13 should be followed with a dose of pneumococcal polysaccharide (PPSV23) vaccine. The PPSV23 vaccine dose should be obtained at least 8 weeks after the dose of PCV13 vaccine. An adult aged 19 years or older who has certain medical conditions and previously received 1 or more doses of PPSV23 vaccine should receive 1 dose of PCV13. The PCV13 vaccine dose should be obtained 1 or more years after the last PPSV23 vaccine dose.    Pneumococcal polysaccharide (PPSV23) vaccine. When PCV13 is also indicated, PCV13 should be obtained first. All adults aged 65 years and older should be immunized. An adult younger than age 65 years who has certain medical conditions should be immunized. Any person who resides in a nursing home or long-term care facility should be immunized. An adult smoker should be immunized.  People with an immunocompromised condition and certain other conditions should receive both PCV13 and PPSV23 vaccines. People with human immunodeficiency virus (HIV) infection should be immunized as soon as possible after diagnosis. Immunization during chemotherapy or radiation therapy should be avoided. Routine use of PPSV23 vaccine is not recommended for American Indians, [place] Natives, or people younger than 65 years unless there are medical   conditions that require PPSV23 vaccine. When indicated, people who have unknown immunization and have no record of immunization should receive PPSV23 vaccine. One-time revaccination 5 years after the first dose of PPSV23 is recommended for people aged 19-64 years who have chronic kidney failure, nephrotic syndrome, asplenia, or immunocompromised conditions. People who received 1-2 doses of PPSV23 before age 9 years should receive another dose of PPSV23 vaccine at age 62 years or later if at least 5 years have passed since the previous dose. Doses of PPSV23 are not needed for people immunized with PPSV23 at or after age 53 years.   Preventive Services / Frequency  Ages 32 years and over  Blood pressure check.  Lipid and cholesterol check.  Lung cancer screening. / Every year if you are aged 71-80 years and have a 30-pack-year history of smoking and currently smoke or have quit within the past 15 years. Yearly screening is stopped once you have quit smoking for at least 15 years or develop a health problem that would prevent you from having lung cancer treatment.  Clinical breast exam.** / Every year after age 65 years.  BRCA-related cancer risk assessment.** / For women who have family members with a BRCA-related cancer (breast, ovarian, tubal, or peritoneal cancers).  Mammogram.** / Every year beginning at age 46 years and continuing for as long as you are in good health. Consult with your health care provider.  Pap test.** / Every 3 years starting at  age 71 years through age 47 or 50 years with 3 consecutive normal Pap tests. Testing can be stopped between 65 and 70 years with 3 consecutive normal Pap tests and no abnormal Pap or HPV tests in the past 10 years.  Fecal occult blood test (FOBT) of stool. / Every year beginning at age 44 years and continuing until age 73 years. You may not need to do this test if you get a colonoscopy every 10 years.  Flexible sigmoidoscopy or colonoscopy.** / Every 5 years for a flexible sigmoidoscopy or every 10 years for a colonoscopy beginning at age 37 years and continuing until age 2 years.  Hepatitis C blood test.** / For all people born from [birth year] through 1965 and any individual with known risks for hepatitis C.  Osteoporosis screening.** / A one-time screening for women ages 9 years and over and women at risk for fractures or osteoporosis.  Skin self-exam. / Monthly.  Influenza vaccine. / Every year.  Tetanus, diphtheria, and acellular pertussis (Tdap/Td) vaccine.** / 1 dose of Td every 10 years.  Zoster vaccine.** / 1 dose for adults aged 31 years or older.  Pneumococcal 13-valent conjugate (PCV13) vaccine.** / Consult your health care provider.  Pneumococcal polysaccharide (PPSV23) vaccine.** / 1 dose for all adults aged 32 years and older. Screening for abdominal aortic aneurysm (AAA)  by ultrasound is recommended for people who have history of high blood pressure or who are current or former smokers.

## 2014-10-17 NOTE — Progress Notes (Signed)
Patient ID: TAKESHIA WENK, female   DOB: Nov 27, 1945, 69 y.o.   MRN: [id number]  [facility] VISIT AND CPE  Assessment:   1. Elevated BP  - Microalbumin / creatinine urine ratio - EKG 12-Lead - [place], RETROPERITNL ABD,  LTD - TSH  2. Elevated cholesterol  - Lipid panel  3. Abnormal glucose  - Hemoglobin A1c - Insulin, random  4. Vitamin D deficiency  - Vit D  25 hydroxy (rtn osteoporosis monitoring)  5. Depression screen   6. At low risk for fall   7. Screening for rectal cancer  - POC Hemoccult Bld/Stl (3-Cd Home Screen); Future  8. Medication management  - Urine Microscopic - CBC with Differential/Platelet - BASIC METABOLIC PANEL WITH GFR - Hepatic function panel - Magnesium  9. Routine general medical examination at a health care facility      Plan:   During the course of the visit the patient was educated and counseled about appropriate screening and preventive services including:    Pneumococcal vaccine   Influenza vaccine  Td vaccine  Screening electrocardiogram  Bone densitometry screening  Colorectal cancer screening  Diabetes screening  Glaucoma screening  Nutrition counseling   Advanced directives: requested  Screening recommendations, referrals: Vaccinations:   There is no immunization history on file for this patient.  Tdap vaccine declined Influenza vaccine declined Pneumococcal vaccine declined Prevnar vaccine declined Shingles vaccine not indicated Hep B vaccine not indicated  Nutrition assessed and recommended  Colonoscopy done ~ age 57 yo and has declined f/u , but does agree to [employer].  Recommended yearly ophthalmology/optometry visit for glaucoma screening and checkup Recommended yearly dental visit for hygiene and checkup Advanced directives - yes  Conditions/risks identified: BMI: Discussed weight loss, diet, and increase physical activity.  Increase physical activity: AHA  recommends 150 minutes of physical activity a week.  Medications reviewed Diabetes is not at goal, ACE/ARB therapy: Not indicated Urinary Incontinence is not an issue: discussed non pharmacology and pharmacology options.  Fall risk: low- discussed PT, home fall assessment, medications.   Subjective:    SELEENA REIMERS is a 69 y.o.  female who presents for Medicare Annual Wellness Visit and complete physical.  No prior medicare wellness visit is known.  She is monitored expectantly for elevated blood pressure. Her blood pressure has been controlled & today her BP is BP: 112/64 mmHg.  She does yardwork and home exercises workout. She denies chest pain, shortness of breath, dizziness.  She is not on cholesterol medication and denies myalgias. Her cholesterol is at goal. The cholesterol last visit was at goal.   Lab Results  Component Value Date   CHOL 189 10/13/2013   HDL 86 10/13/2013   LDLCALC 88 10/13/2013   TRIG 74 10/13/2013   CHOLHDL 2.2 10/13/2013   She relates prior hx/o abnormal blood sugars. She has been working on diet and exercise for prediabetes, and denies foot ulcerations, hyperglycemia, hyperglycemia, hypoglycemia , nausea, paresthesia of the feet, polydipsia, polyuria, visual disturbances and weight loss. Last A1C in the office was 5.5% in July 2015.   Patient is not currently on a Vitamin D supplement.  Last Vit D was 78 (on a supplement) in July 2015.  Patient does relate that she sees a Restaurant manager, fast food and also a Homeopath working in the same office in [place]  where they "hook" her up with wires to a "machine" & they diagnose her with various deficiencies , additives, "ingredients" and sell he "antidotes" to maintain  her balance. 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    Names of Other Physician/Practitioners you currently use: 1. [facility] here  for primary care 2. Dr Illene Regulus, OD, eye doctor, last visit 2014 3. Dr Raul Del, DDS/[place] , dentist, last visit 2015  Patient Care Team: [name], MD as PCP - General (Internal Medicine) Paula Compton, MD as Consulting Physician (Obstetrics and Gynecology) Richmond Campbell, MD as Consulting Physician (Gastroenterology)  Medication Review: Medication Sig  . Ascorbic Acid (VITAMIN C PO) Take by mouth daily.  . B Complex Vitamins (VITAMIN B COMPLEX PO) Take by mouth daily.  . Cholecalciferol (VITAMIN D PO) Take 2,000 Int'l Units by mouth daily.  . milk thistle 175 MG tablet Take 175 mg by mouth 2 (two) times daily.  . Multiple Vitamins-Minerals (MULTIVITAMIN PO) Take by mouth daily.  . Omega-3 Fatty Acids (OMEGA 3 PO) Take 500 mg by mouth 2 (two) times daily.  Marland Kitchen OVER THE COUNTER MEDICATION Calcium  Citrate 500 mg 4 tabs daily  . OVER THE COUNTER MEDICATION Magnesium 500 mg daily  . OVER THE COUNTER MEDICATION Enzyme with HCl and pepsid daily  . OVER THE COUNTER MEDICATION Iodine 4 drops daily  . OVER THE COUNTER MEDICATION Trace minerals 20 drops a day  . Probiotic Product (PROBIOTIC DAILY PO) Take by mouth. Takes when she thinks of med  . Pyridoxine HCl (VITAMIN B-6) 500 MG tablet Take 500 mg by mouth daily.  Marland Kitchen VITAMIN E PO Take by mouth daily.   Current Problems (verified) Patient Active Problem List   Diagnosis Date Noted  . Fatigue 10/13/2013   Screening Tests Health Maintenance  Topic Date Due  . TETANUS/TDAP  09/30/1964  . MAMMOGRAM  10/01/1995  . COLONOSCOPY  10/01/1995  . ZOSTAVAX  09/30/2005  . DEXA SCAN  10/01/2010  . PNA vac Low Risk Adult (1 of 2 - PCV13) 10/01/2010  . INFLUENZA VACCINE  11/13/2014   Patient Declines all immunizations.   Preventative care: Last colonoscopy: About Age 49 yo & declines f/u colonoscopy, but did agree to doing a Cologard test.  History reviewed: allergies, current medications, past family history, past medical  history, past social history, past surgical history and problem list  Risk Factors: Tobacco History  Substance Use Topics  . Smoking status: Never Smoker   . Smokeless tobacco: Not on file  . Alcohol Use: No   She does not smoke.  Patient is not a former smoker. Are there smokers in your home (other than you)?  No Alcohol Current alcohol use: none  Caffeine Current caffeine use: denies use  Exercise Current exercise: yard work  Nutrition/Diet Current diet: in general, a "healthy" diet    Cardiac risk factors: advanced age (older than 79 for men, 60 for women).  Depression Screen (Note: if answer to either of the following is "Yes", a more complete depression screening is indicated)   Q1: Over the past two weeks, have you felt down, depressed or hopeless? No  Q2: Over the past two weeks, have you felt little interest or pleasure in doing things? No  Have you lost interest or pleasure in daily life? No  Do you often feel hopeless? No  Do you cry easily over simple problems? No  Activities of Daily Living In your present state of health, do you have any difficulty performing the following activities?:  Driving? No Managing money?  No Feeding yourself? No Getting from bed to chair? No Climbing a flight of stairs? No Preparing food and eating?: No Bathing or showering? No Getting dressed: No Getting to the toilet? No Using the toilet:No Moving around from place to place: No In the past year have you fallen or had a near fall?:No   Are you sexually active?  No  Do you have more than one partner?  No  Vision Difficulties: No  Hearing Difficulties: No Do you often ask people to speak up or repeat themselves? No Do you experience ringing or noises in your ears? No Do you have difficulty understanding soft or whispered voices? Sometimes.  Cognition  Do you feel that you have a problem with memory?No  Do you often misplace items? No  Do you feel safe at home?   Yes  Advanced directives Does patient have a [place]? Yes Does patient have a Living Will? Yes  No past medical history on file. Past Surgical History  Procedure Laterality Date  . Ovarian cyst removal  1973    ROS: Constitutional: Denies fever, chills, weight loss/gain, headaches, insomnia, fatigue, night sweats, and change in appetite. Eyes: Denies redness, blurred vision, diplopia, discharge, itchy, watery eyes.  ENT: Denies discharge, congestion, post nasal drip, epistaxis, sore throat, earache, hearing loss, dental pain, Tinnitus, Vertigo, Sinus pain, snoring.  Cardio: Denies chest pain, palpitations, irregular heartbeat, syncope, dyspnea, diaphoresis, orthopnea, PND, claudication, edema Respiratory: denies cough, dyspnea, DOE, pleurisy, hoarseness, laryngitis, wheezing.  Gastrointestinal: Denies dysphagia, heartburn, reflux, water brash, pain, cramps, nausea, vomiting, bloating, diarrhea, constipation, hematemesis, melena, hematochezia, jaundice, hemorrhoids Genitourinary: Denies dysuria, frequency, urgency, nocturia, hesitancy, discharge, hematuria, flank pain Breast: Breast lumps, nipple discharge, bleeding.  Musculoskeletal: Denies arthralgia, myalgia, stiffness, Jt. Swelling, pain, limp, and strain/sprain. Denies falls. Skin: Denies puritis, rash, hives, warts, acne, eczema, changing in skin lesion Neuro: No weakness, tremor, incoordination, spasms, paresthesia, pain Psychiatric: Denies confusion, memory loss, sensory loss. Denies Depression. Endocrine: Denies change in weight, skin, hair change, nocturia, and paresthesia, diabetic polys, visual blurring, hyper / hypo glycemic episodes.  Heme/Lymph: No excessive bleeding, bruising, enlarged lymph nodes  Objective:     BP 112/64 mmHg  Pulse 64  Temp(Src) 97.4 F (36.3 C)  Resp 16  Ht 5\' 2"  (1.575 m)  Wt 99 lb (44.906 kg)  BMI 18.10 kg/m2  General Appearance: Well nourished, alert, WD/WN, female  and in no apparent distress. Eyes: PERRLA, EOMs, conjunctiva no swelling or erythema, normal fundi and vessels. Sinuses: No frontal/maxillary tenderness ENT/Mouth: EACs patent / TMs  nl. Nares clear without erythema, swelling, mucoid exudates. Oral hygiene is good. No erythema, swelling, or exudate. Tongue normal, non-obstructing. Tonsils not swollen or erythematous. Hearing normal.  Neck: Supple, thyroid normal. No bruits, nodes or JVD. Respiratory: Respiratory effort normal.  BS equal and clear bilateral without rales, rhonci, wheezing or stridor. Cardio: Heart sounds are normal with regular rate and rhythm and no murmurs, rubs or gallops. Peripheral pulses are normal and equal bilaterally without edema. No aortic or femoral bruits. Chest: symmetric with normal excursions and percussion. Breasts: Symmetric, without lumps, nipple discharge, retractions, or fibrocystic changes.  Abdomen: Flat, soft  with nl bowel sounds. Nontender, no guarding, rebound, hernias, masses, or organomegaly.  Lymphatics: Non tender without lymphadenopathy.  Genitourinary:  Musculoskeletal: Full ROM all peripheral extremities, joint stability, 5/5 strength, and normal gait. Skin: Warm and dry without rashes, lesions, cyanosis, clubbing or  ecchymosis.  Neuro: Cranial nerves intact, reflexes equal bilaterally. Normal  muscle tone, no cerebellar symptoms. Sensation intact.  Pysch: Alert and oriented X 3, normal affect, Insight and Judgment appropriate.   Cognitive Testing  Alert? Yes  Normal Appearance?Yes  Oriented to person? Yes  Place? Yes   Time? Yes  Recall of three objects?  Yes  Can perform simple calculations? Yes  Displays appropriate judgment? Yes  Can read the correct time from a watch/clock?Yes  Medicare Attestation I have personally reviewed: The patient's medical and social history Their use of alcohol, tobacco or illicit drugs Their current medications and supplements The patient's functional  ability including ADLs,fall risks, home safety risks, cognitive, and hearing and visual impairment Diet and physical activities Evidence for depression or mood disorders  The patient's weight, height, BMI, and visual acuity have been recorded in the chart.  I have made referrals, counseling, and provided education to the patient based on review of the above and I have provided the patient with a written personalized care plan for preventive services.  Over 40 minutes of exam, counseling, chart review was performed.   Abhay Godbolt DAVID, MD   10/17/2014

## 2014-10-18 LAB — INSULIN, RANDOM: INSULIN: 5 u[IU]/mL (ref 2.0–19.6)

## 2014-10-18 LAB — URINALYSIS, MICROSCOPIC ONLY
Bacteria, UA: NONE SEEN
CASTS: NONE SEEN
Crystals: NONE SEEN
SQUAMOUS EPITHELIAL / LPF: NONE SEEN

## 2014-10-18 LAB — VITAMIN D 25 HYDROXY (VIT D DEFICIENCY, FRACTURES): VIT D 25 HYDROXY: 75 ng/mL (ref 30–100)

## 2014-10-18 LAB — MICROALBUMIN / CREATININE URINE RATIO
CREATININE, URINE: 20.4 mg/dL
Microalb Creat Ratio: 9.8 mg/g (ref 0.0–30.0)
Microalb, Ur: 0.2 mg/dL (ref <2.0)

## 2014-11-03 ENCOUNTER — Ambulatory Visit (INDEPENDENT_AMBULATORY_CARE_PROVIDER_SITE_OTHER): Payer: PPO | Admitting: Physician Assistant

## 2014-11-03 VITALS — BP 118/70 | HR 61 | Temp 98.3°F | Resp 16 | Ht 63.0 in | Wt 99.8 lb

## 2014-11-03 DIAGNOSIS — S50852A Superficial foreign body of left forearm, initial encounter: Secondary | ICD-10-CM

## 2014-11-03 NOTE — Patient Instructions (Signed)
WOUND CARE . Remove the bandage in about 30 minutes. . Each day, wash wound gently with mild soap and warm water.  . Notify the office if you experience any of the following signs of infection: Swelling, redness, pus drainage, streaking, fever >101.0 F . Notify the office if you experience excessive bleeding that does not stop after 15-20 minutes of constant, firm pressure.

## 2014-11-03 NOTE — Progress Notes (Signed)
Patient ID: Tricia [name], female    DOB: 12-Mar-1946, 69 y.o.   MRN: [id number]  PCP: Alesia Richards, MD  Subjective:   Chief Complaint  Patient presents with  . Foreign Body in Skin    left arm    HPI Presents for evaluation of a splinter in the LEFT forearm x 3 weeks.  There is a small lump at the site, minimally tender, and only when she squeezes it. She wants it removed.  No fever, chills. No drainage from the site.No redness or red streaking.  Isn't sure when her last tetanus vaccine was. Prefers not to take it, due to sensitivity to chemicals.   Review of Systems As above.   Patient Active Problem List   Diagnosis Date Noted  . Fatigue 10/13/2013     Prior to Admission medications   Medication Sig Start Date End Date Taking? Authorizing Provider  Ascorbic Acid (VITAMIN C PO) Take by mouth daily.   Yes Historical Provider, MD  B Complex Vitamins (VITAMIN B COMPLEX PO) Take by mouth daily.   Yes Historical Provider, MD  Cholecalciferol (VITAMIN D PO) Take 2,000 Int'l Units by mouth daily.   Yes Historical Provider, MD  milk thistle 175 MG tablet Take 175 mg by mouth 2 (two) times daily.   Yes Historical Provider, MD  Multiple Vitamins-Minerals (MULTIVITAMIN PO) Take by mouth daily.   Yes Historical Provider, MD  Omega-3 Fatty Acids (OMEGA 3 PO) Take 500 mg by mouth 2 (two) times daily.   Yes Historical Provider, MD  OVER THE COUNTER MEDICATION Calcium  Citrate 500 mg 4 tabs daily   Yes Historical Provider, MD  OVER THE COUNTER MEDICATION Magnesium 500 mg daily   Yes Historical Provider, MD  OVER THE COUNTER MEDICATION Enzyme with HCl and pepsid daily   Yes Historical Provider, MD  OVER THE COUNTER MEDICATION Iodine 4 drops daily   Yes Historical Provider, MD  OVER THE COUNTER MEDICATION Trace minerals 20 drops a day   Yes Historical Provider, MD  OVER THE COUNTER MEDICATION Dandelion 2 tabs daily   Yes Historical Provider, MD  OVER THE COUNTER MEDICATION  Heartease 3 tabs BID   Yes Historical Provider, MD  Probiotic Product (PROBIOTIC DAILY PO) Take by mouth. Takes when she thinks of med   Yes Historical Provider, MD  Pyridoxine HCl (VITAMIN B-6) 500 MG tablet Take 500 mg by mouth daily.   Yes Historical Provider, MD  VITAMIN E PO Take by mouth daily.   Yes Historical Provider, MD     Allergies  Allergen Reactions  . Sulfa Antibiotics     GI problems  . Iodine Rash    Patient states she had a rash with IV contrast  . Penicillins Rash       Objective:  Physical Exam  Constitutional: She is oriented to person, place, and time. She appears well-developed and well-nourished. She is active and cooperative. No distress.  BP 118/70 mmHg  Pulse 61  Temp(Src) 98.3 F (36.8 C) (Oral)  Resp 16  Ht 5\' 3"  (1.6 m)  Wt 99 lb 12.8 oz (45.269 kg)  BMI 17.68 kg/m2  SpO2 98%   Eyes: Conjunctivae are normal.  Pulmonary/Chest: Effort normal.  Neurological: She is alert and oriented to person, place, and time.  Skin: Skin is warm and dry. No ecchymosis noted. No erythema.     Psychiatric: She has a normal mood and affect. Her speech is normal and behavior is normal.   PROCEDURE: With  permission, area prepped with alcohol and 15 blade used to incise the area. At her request, the first attempt was made without local anesthesia. Because we were not successful, the area was then anesthetized with 1 cc 2% lidocaine plain and the incision was elongated and deepened. A very thin 0.5 cm splinter was removed without difficulty. Hemostasis achieved with pressure and a bandaid applied.        Assessment & Plan:  1. Foreign body in forearm, left, initial encounter Removed. Local wound care. Follow-up PRN.   Fara Chute, PA-C Physician Assistant-Certified [facility]

## 2014-11-07 LAB — COLOGUARD: Cologuard: NEGATIVE

## 2014-11-14 ENCOUNTER — Telehealth: Payer: Self-pay | Admitting: *Deleted

## 2014-11-14 LAB — COLOGUARD

## 2014-11-14 NOTE — Telephone Encounter (Signed)
Patient advised of normal Cologuard test.

## 2014-11-21 ENCOUNTER — Other Ambulatory Visit: Payer: Self-pay | Admitting: *Deleted

## 2014-11-21 DIAGNOSIS — Z1212 Encounter for screening for malignant neoplasm of rectum: Secondary | ICD-10-CM

## 2014-11-21 LAB — POC HEMOCCULT BLD/STL (HOME/3-CARD/SCREEN)
Card #2 Fecal Occult Blod, POC: NEGATIVE
FECAL OCCULT BLD: NEGATIVE
Fecal Occult Blood, POC: NEGATIVE

## 2014-12-05 ENCOUNTER — Encounter: Payer: Self-pay | Admitting: Physician Assistant

## 2014-12-05 ENCOUNTER — Ambulatory Visit (INDEPENDENT_AMBULATORY_CARE_PROVIDER_SITE_OTHER): Payer: PPO | Admitting: Physician Assistant

## 2014-12-05 VITALS — BP 104/60 | HR 60 | Temp 97.3°F | Resp 16 | Ht 62.0 in | Wt 100.4 lb

## 2014-12-05 DIAGNOSIS — M791 Myalgia, unspecified site: Secondary | ICD-10-CM

## 2014-12-05 DIAGNOSIS — R350 Frequency of micturition: Secondary | ICD-10-CM

## 2014-12-05 LAB — HEPATIC FUNCTION PANEL
ALBUMIN: 4.3 g/dL (ref 3.6–5.1)
ALT: 15 U/L (ref 6–29)
AST: 21 U/L (ref 10–35)
Alkaline Phosphatase: 82 U/L (ref 33–130)
Bilirubin, Direct: 0.1 mg/dL (ref <=0.2)
Indirect Bilirubin: 0.5 mg/dL (ref 0.2–1.2)
TOTAL PROTEIN: 6.1 g/dL (ref 6.1–8.1)
Total Bilirubin: 0.6 mg/dL (ref 0.2–1.2)

## 2014-12-05 LAB — CBC WITH DIFFERENTIAL/PLATELET
BASOS PCT: 0 % (ref 0–1)
Basophils Absolute: 0 10*3/uL (ref 0.0–0.1)
Eosinophils Absolute: 0 10*3/uL (ref 0.0–0.7)
Eosinophils Relative: 1 % (ref 0–5)
HEMATOCRIT: 38.3 % (ref 36.0–46.0)
HEMOGLOBIN: 13.2 g/dL (ref 12.0–15.0)
Lymphocytes Relative: 31 % (ref 12–46)
Lymphs Abs: 0.9 10*3/uL (ref 0.7–4.0)
MCH: 32.4 pg (ref 26.0–34.0)
MCHC: 34.5 g/dL (ref 30.0–36.0)
MCV: 94.1 fL (ref 78.0–100.0)
MPV: 8.9 fL (ref 8.6–12.4)
Monocytes Absolute: 0.3 10*3/uL (ref 0.1–1.0)
Monocytes Relative: 9 % (ref 3–12)
NEUTROS ABS: 1.7 10*3/uL (ref 1.7–7.7)
Neutrophils Relative %: 59 % (ref 43–77)
Platelets: 187 10*3/uL (ref 150–400)
RBC: 4.07 MIL/uL (ref 3.87–5.11)
RDW: 12.4 % (ref 11.5–15.5)
WBC: 2.8 10*3/uL — ABNORMAL LOW (ref 4.0–10.5)

## 2014-12-05 LAB — BASIC METABOLIC PANEL WITH GFR
BUN: 10 mg/dL (ref 7–25)
CALCIUM: 9.6 mg/dL (ref 8.6–10.4)
CO2: 28 mmol/L (ref 20–31)
Chloride: 103 mmol/L (ref 98–110)
Creat: 0.71 mg/dL (ref 0.50–0.99)
GFR, Est African American: >89 mL/min (ref >=60)
GFR, Est Non African American: 87 mL/min (ref >=60)
Glucose, Bld: 88 mg/dL (ref 65–99)
Potassium: 4.3 mmol/L (ref 3.5–5.3)
SODIUM: 142 mmol/L (ref 135–146)

## 2014-12-05 LAB — MAGNESIUM: Magnesium: 2.3 mg/dL (ref 1.5–2.5)

## 2014-12-05 NOTE — Progress Notes (Signed)
Subjective:    Patient ID: Tricia [name], female    DOB: 1945/05/07, 69 y.o.   MRN: [id number]  HPI 69 y.o. WF presents with UTI symptoms and tick bite 1 month ago. She states 1 month ago on her left inguinal area had tick bite, unknown amount of time on her, and states it got infected. Used alcohol to get it out, small brown tick.Denies rash but states bilateral leg hurt, and some neck/back pain.    She has a history of UTIs and has been feeling lower back pain and leg cramping which is often accompanies her UTI. She has some suprapubic discomfort. She has none prolapse and has increased frequency.   Blood pressure 104/60, pulse 60, temperature 97.3 F (36.3 C), resp. rate 16, height 5\' 2"  (1.575 m), weight 100 lb 6.4 oz (45.541 kg).  No past medical history on file.  Current Outpatient Prescriptions on File Prior to Visit  Medication Sig Dispense Refill  . Ascorbic Acid (VITAMIN C PO) Take by mouth daily.    . B Complex Vitamins (VITAMIN B COMPLEX PO) Take by mouth daily.    . Cholecalciferol (VITAMIN D PO) Take 2,000 Int'l Units by mouth daily.    . milk thistle 175 MG tablet Take 175 mg by mouth 2 (two) times daily.    . Multiple Vitamins-Minerals (MULTIVITAMIN PO) Take by mouth daily.    . Omega-3 Fatty Acids (OMEGA 3 PO) Take 500 mg by mouth 2 (two) times daily.    Marland Kitchen OVER THE COUNTER MEDICATION Calcium  Citrate 500 mg 2 tabs daily    . OVER THE COUNTER MEDICATION Magnesium 500 mg daily    . OVER THE COUNTER MEDICATION Enzyme with HCl and pepsid daily    . OVER THE COUNTER MEDICATION Iodine 5 drops daily    . OVER THE COUNTER MEDICATION Trace minerals 20 drops a day    . OVER THE COUNTER MEDICATION Dandelion 2 tabs daily    . Probiotic Product (PROBIOTIC DAILY PO) Take by mouth. 1 daily.    . Pyridoxine HCl (VITAMIN B-6) 500 MG tablet Take 500 mg by mouth daily.     No current facility-administered medications on file prior to visit.    Review of Systems  Constitutional:  Positive for fatigue. Negative for fever, chills, diaphoresis, activity change, appetite change and unexpected weight change.  HENT: Negative.   Respiratory: Negative.   Cardiovascular: Negative.   Gastrointestinal: Negative.   Genitourinary: Positive for frequency. Negative for dysuria, urgency, hematuria, flank pain, decreased urine volume, vaginal bleeding, vaginal discharge, enuresis, difficulty urinating, genital sores, vaginal pain, menstrual problem, pelvic pain and dyspareunia.  Musculoskeletal: Positive for myalgias, back pain and arthralgias. Negative for joint swelling, gait problem, neck pain and neck stiffness.  Skin: Negative.  Negative for rash.  Neurological: Negative.   Psychiatric/Behavioral: Negative.       Objective:   Physical Exam  Constitutional: She is oriented to person, place, and time. She appears well-developed and well-nourished.  HENT:  Head: Normocephalic and atraumatic.  Right Ear: External ear normal.  Left Ear: External ear normal.  Mouth/Throat: Oropharynx is clear and moist.  Eyes: Conjunctivae and EOM are normal. Pupils are equal, round, and reactive to light.  Neck: Normal range of motion. Neck supple. No thyromegaly present.  Cardiovascular: Normal rate, regular rhythm and normal heart sounds.  Exam reveals no gallop and no friction rub.   No murmur heard. Pulmonary/Chest: Effort normal and breath sounds normal. No respiratory distress. She  has no wheezes.  Abdominal: Soft. Bowel sounds are normal. She exhibits no distension and no mass. There is no tenderness. There is no rebound and no guarding.  Musculoskeletal: Normal range of motion.  Lymphadenopathy:    She has no cervical adenopathy.  Neurological: She is alert and oriented to person, place, and time. She displays normal reflexes. No cranial nerve deficit. Coordination normal.  Skin: Skin is warm and dry.  Psychiatric: She has a normal mood and affect.      Assessment & Plan:  General  myalgias With recent tick bite, will check lyme/RMSF, will not prescribe ABX at this time Check UA C&S with urine history, rule out dehydration, electrolyte abnormality.  Increase fluids, take tylenol PRN.

## 2014-12-06 LAB — URINALYSIS, ROUTINE W REFLEX MICROSCOPIC
BILIRUBIN URINE: NEGATIVE
Glucose, UA: NEGATIVE
Hgb urine dipstick: NEGATIVE
KETONES UR: NEGATIVE
Leukocytes, UA: NEGATIVE
Nitrite: NEGATIVE
PROTEIN: NEGATIVE
Specific Gravity, Urine: 1.007 (ref 1.001–1.035)
pH: 7.5 (ref 5.0–8.0)

## 2014-12-06 LAB — MICROALBUMIN / CREATININE URINE RATIO: Creatinine, Urine: 32.8 mg/dL

## 2014-12-07 LAB — ROCKY MTN SPOTTED FVR ABS PNL(IGG+IGM)
RMSF IGG: 0.12 IV
RMSF IGM: 0.68 IV

## 2014-12-08 LAB — LYME ABY, WSTRN BLT IGG & IGM W/BANDS
B burgdorferi IgG Abs (IB): NEGATIVE
B burgdorferi IgM Abs (IB): NEGATIVE
LYME DISEASE 18 KD IGG: NONREACTIVE
LYME DISEASE 23 KD IGM: NONREACTIVE
LYME DISEASE 39 KD IGM: NONREACTIVE
LYME DISEASE 41 KD IGG: NONREACTIVE
LYME DISEASE 66 KD IGG: NONREACTIVE
Lyme Disease 23 kD IgG: NONREACTIVE
Lyme Disease 28 kD IgG: NONREACTIVE
Lyme Disease 30 kD IgG: NONREACTIVE
Lyme Disease 39 kD IgG: NONREACTIVE
Lyme Disease 41 kD IgM: NONREACTIVE
Lyme Disease 45 kD IgG: NONREACTIVE
Lyme Disease 58 kD IgG: NONREACTIVE
Lyme Disease 93 kD IgG: NONREACTIVE

## 2015-01-20 ENCOUNTER — Encounter: Payer: Self-pay | Admitting: *Deleted

## 2015-02-13 ENCOUNTER — Ambulatory Visit (INDEPENDENT_AMBULATORY_CARE_PROVIDER_SITE_OTHER): Payer: PPO | Admitting: Physician Assistant

## 2015-02-13 VITALS — BP 112/64 | HR 60 | Temp 98.5°F | Resp 16 | Ht 62.0 in | Wt 103.0 lb

## 2015-02-13 DIAGNOSIS — L989 Disorder of the skin and subcutaneous tissue, unspecified: Secondary | ICD-10-CM | POA: Diagnosis not present

## 2015-02-13 DIAGNOSIS — B070 Plantar wart: Secondary | ICD-10-CM | POA: Diagnosis not present

## 2015-02-13 NOTE — Progress Notes (Signed)
   02/13/2015 at 6:36 PM  Tricia [name] / DOB: 06-Nov-1945 / MRN: [id number]  The patient has Fatigue on her problem list.  SUBJECTIVE  Tricia [name] is a 69 y.o. female who complains of a spot on her left lateral plantar foot surface that appeared roughly three weeks ago.  Report that she may have stepped on some glass but has never had any tenderness to the area.  .  She  has no past medical history on file.    Medications reviewed and updated by myself where necessary, and exist elsewhere in the encounter.   [name] is allergic to sulfa antibiotics; iodine; and penicillins. She  reports that she has never smoked. She does not have any smokeless tobacco history on file. She reports that she does not drink alcohol or use illicit drugs. She  has no sexual activity history on file. The patient  has past surgical history that includes Ovarian cyst removal (1973).  Her family history includes Cancer in her father; Goiter in her mother; Heart attack in her father; Leukemia in her mother; Stroke in her father.  Review of Systems  Skin: Negative for itching.    OBJECTIVE  Her  height is 5\' 2"  (1.575 m) and weight is 103 lb (46.72 kg). Her oral temperature is 98.5 F (36.9 C). Her blood pressure is 112/64 and her pulse is 60. Her respiration is 16 and oxygen saturation is 98%.  The patient's body mass index is 18.83 kg/(m^2).  Physical Exam  Vitals reviewed. Musculoskeletal:       Left foot: There is no tenderness.       Feet:   Procedure: Liquid nitrogen applied x 15 to wart surface.  Patient tolerated without pain.    No results found for this or any previous visit (from the past 24 hour(s)).  ASSESSMENT & PLAN  Tricia [name] was seen today for foot injury.  Diagnoses and all orders for this visit:  Foot lesion  Plantar wart of left foot -     PR EXC SKIN BENIG <0.5 CM TRUNK,ARM,LEG    The patient was advised to call or come back to clinic if she does not see an  improvement in symptoms, or worsens with the above plan.   Tricia [name], MHS, PA-C [facility] 02/13/2015 6:36 PM

## 2015-02-13 NOTE — Patient Instructions (Signed)
Please apply vaseline to the wound if it is hurting, along with a band-aid.  Take up to 400 mg of Ibuprofen every 8 hours for pain as needed.

## 2015-06-25 ENCOUNTER — Encounter (HOSPITAL_COMMUNITY): Payer: Self-pay | Admitting: Emergency Medicine

## 2015-06-25 ENCOUNTER — Emergency Department (HOSPITAL_COMMUNITY): Payer: PPO

## 2015-06-25 ENCOUNTER — Emergency Department (HOSPITAL_COMMUNITY)
Admission: EM | Admit: 2015-06-25 | Discharge: 2015-06-25 | Disposition: A | Discharge status: Home / Self Care | Payer: PPO | Attending: Emergency Medicine | Admitting: Emergency Medicine

## 2015-06-25 ENCOUNTER — Encounter: Payer: Self-pay | Admitting: Internal Medicine

## 2015-06-25 ENCOUNTER — Ambulatory Visit (INDEPENDENT_AMBULATORY_CARE_PROVIDER_SITE_OTHER): Payer: PPO | Admitting: Internal Medicine

## 2015-06-25 VITALS — BP 102/58 | HR 64 | Temp 97.2°F | Resp 16 | Ht 62.0 in | Wt 101.4 lb

## 2015-06-25 DIAGNOSIS — Y92009 Unspecified place in unspecified non-institutional (private) residence as the place of occurrence of the external cause: Secondary | ICD-10-CM | POA: Insufficient documentation

## 2015-06-25 DIAGNOSIS — Z880 Allergy status to penicillin: Secondary | ICD-10-CM | POA: Diagnosis not present

## 2015-06-25 DIAGNOSIS — S79911A Unspecified injury of right hip, initial encounter: Secondary | ICD-10-CM | POA: Diagnosis not present

## 2015-06-25 DIAGNOSIS — W010XXA Fall on same level from slipping, tripping and stumbling without subsequent striking against object, initial encounter: Secondary | ICD-10-CM | POA: Diagnosis not present

## 2015-06-25 DIAGNOSIS — Z79899 Other long term (current) drug therapy: Secondary | ICD-10-CM | POA: Insufficient documentation

## 2015-06-25 DIAGNOSIS — S7001XA Contusion of right hip, initial encounter: Secondary | ICD-10-CM

## 2015-06-25 DIAGNOSIS — M25551 Pain in right hip: Secondary | ICD-10-CM | POA: Diagnosis not present

## 2015-06-25 DIAGNOSIS — S32501A Unspecified fracture of right pubis, initial encounter for closed fracture: Secondary | ICD-10-CM | POA: Insufficient documentation

## 2015-06-25 DIAGNOSIS — Y9301 Activity, walking, marching and hiking: Secondary | ICD-10-CM | POA: Insufficient documentation

## 2015-06-25 DIAGNOSIS — S32591A Other specified fracture of right pubis, initial encounter for closed fracture: Secondary | ICD-10-CM | POA: Diagnosis not present

## 2015-06-25 DIAGNOSIS — Y998 Other external cause status: Secondary | ICD-10-CM | POA: Insufficient documentation

## 2015-06-25 MED ORDER — FENTANYL CITRATE (PF) 100 MCG/2ML IJ SOLN
50.0000 ug | Freq: Once | INTRAMUSCULAR | Status: DC
Start: 2015-06-25 — End: 2015-06-25

## 2015-06-25 MED ORDER — HYDROCODONE-ACETAMINOPHEN 5-325 MG PO TABS: 1.0000 | ORAL_TABLET | Freq: Four times a day (QID) | ORAL | Status: DC | PRN

## 2015-06-25 MED ORDER — IBUPROFEN 400 MG PO TABS: 400.0000 mg | ORAL_TABLET | Freq: Four times a day (QID) | ORAL | Status: DC | PRN

## 2015-06-25 NOTE — Discharge Instructions (Signed)
Take ibuprofen for pain. Take norco for severe pain only. Use crutches when ambulating. Please follow up with orthopedics as soon as able for recheck and further treatment.   Simple Pelvic Fracture, Adult A pelvic fracture is a break in one of the pelvic bones. The pelvic bones include the bones that you sit on and the bones that make up the lower part of your spine. A pelvic fracture is called simple if the broken bones are stable and are not moving out of place. CAUSES  Common causes of this type of fracture include:  A fall.  A car accident.  Force or pressure applied to the pelvis. RISK FACTORS You may be at higher risk for this type of fracture if:  You play high-impact sports.  You are an older person with a condition that causes weak bones (osteoporosis).  You have a bone-weakening disease. SIGNS AND SYMPTOMS Signs and symptoms may include:  Tenderness, swelling, or bruising in the affected area.  Pain when moving the hip.  Pain when walking or standing. DIAGNOSIS A diagnosis is made with a physical exam and X-rays. Sometimes, a CT scan is also done. TREATMENT The goal of treatment is to get the bones to heal in a good position. Treatment of a simple pelvic fracture usually involves staying in bed (bed rest) and using crutches or a walker until the bones heal. Medicines may be prescribed for pain. Medicines may also be prescribed that help to prevent blood clots from forming in the legs. HOME CARE INSTRUCTIONS Managing Pain, Stiffness, and Swelling  If directed, apply ice to the injured area:  Put ice in a plastic bag.  Place a towel between your skin and the bag.  Leave the ice on for 20 minutes, 2-3 times a day.  Raise the injured area above the level of your heart while you are sitting or lying down. Driving  Do not  drive or operate heavy machinery until your health care provider tells you it is safe to do. Activity  Stay on bed rest for as long as directed  by your health care provider.  While on bed rest:  Change the position of your legs every 1-2 hours. This keeps blood moving well through both of your legs.  You may sit for as long as you feel comfortable.  After bed rest:  Avoid strenuous activities for as long as directed by your health care provider.  Return to your normal activities as directed by your health care provider. Ask your health care provider what activities are safe for you. Safety  Do not use the injured limb to support your body weight until your health care provider says that you can. Use crutches or a walker as directed by your health care provider. General Instructions  Do not use any tobacco products, including cigarettes, chewing tobacco, or electronic cigarettes. Tobacco can delay bone healing. If you need help quitting, ask your health care provider.  Take medicines only as directed by your health care provider.  Keep all follow-up visits as directed by your health care provider. This is important. SEEK MEDICAL CARE IF:  Your pain gets worse.  Your pain is not relieved with medicines. SEEK IMMEDIATE MEDICAL CARE IF:  You feel light-headed or faint.  You develop chest pain.  You develop shortness of breath.  You have a fever.  You have blood in your urine or your stools.  You have vaginal bleeding.  You have difficulty or pain with urination or with  a bowel movement.  You have difficulty or increased pain with walking.  You have new or increased swelling in one of your legs.  You have numbness in your legs or groin area.   This information is not intended to replace advice given to you by your health care provider. Make sure you discuss any questions you have with your health care provider.   Document Released: 06/09/2001 Document Revised: 04/21/2014 Document Reviewed: 11/22/2013 Elsevier Interactive Patient Education [employer].

## 2015-06-25 NOTE — ED Notes (Signed)
Pt does not want to get undress changed into a gown. States x-ray has already been done and dr can examine with out her being in gown if necessary.

## 2015-06-25 NOTE — Progress Notes (Signed)
  Subjective:    Patient ID: Tricia [name], female    DOB: 05/27/1945, 70 y.o.   MRN: [id number]  HPI  Patient presents with hx of tripping over a pillow at home yesterday and has been using crutches due to pain in the R hip with weight -bearing. Denies bwel or bladder issues.   Medication Sig  . Ascorbic Acid (VITAMIN C PO) Take 1 tablet by mouth daily.   . B Complex Vitamins (VITAMIN B COMPLEX PO) Take 1 tablet by mouth daily.   . Cholecalciferol (VITAMIN D PO) Take 5,000 Units by mouth daily.   . Multiple Vitamins-Minerals (MULTIVITAMIN PO) Take 1 tablet by mouth daily.   . Omega-3 Fatty Acids (OMEGA 3 PO) Take 500 mg by mouth 2 (two) times daily.  Marland Kitchen OVER THE COUNTER MEDICATION Take 1,000 mg by mouth daily. Calcium  Citrate  . OVER THE COUNTER MEDICATION Take 500 mg by mouth daily. Magnesium  . OVER THE COUNTER MEDICATION Enzyme with HCl and pepsid daily  . OVER THE COUNTER MEDICATION Iodine 1 drops daily  . OVER THE COUNTER MEDICATION Trace minerals 20 drops a day  . OVER THE COUNTER MEDICATION thytrophen  3 per day.  . Probiotic Product (PROBIOTIC DAILY PO) Take 1 tablet by mouth daily.   . Pyridoxine HCl (VITAMIN B-6) 500 MG tablet Take 500 mg by mouth daily.  . milk thistle 175 MG tablet Take 175 mg by mouth 2 (two) times daily.  Marland Kitchen OVER THE COUNTER MEDICATION Dandelion 2 tabs daily   Allergies  Allergen Reactions  . Sulfa Antibiotics     GI problems  . Iodine Rash    Patient states she had a rash with IV contrast  . Penicillins Rash   Review of Systems 10 point systems review negative except as above.    Objective:   Physical Exam  BP 102/58 mmHg  Pulse 64  Temp(Src) 97.2 F (36.2 C)  Resp 16  Ht 5\' 2"  (1.575 m)  Wt 101 lb 6.4 oz (45.995 kg)  BMI 18.54 kg/m2  MS w/o deformity and no foreshortening of the RLE. Has discomfort in the R groin with internal rotation of the R hip.     Assessment & Plan:    1. Contusion, hip, right, initial encounter  - referred  to ER to evaluate at recommendations of office staff at [facility].

## 2015-06-25 NOTE — ED Provider Notes (Signed)
CSN: [id number]     Arrival date & time 06/25/15  1241 History   First MD Initiated Contact with Patient 06/25/15 1605     Chief Complaint  Patient presents with  . Hip Injury     (Consider location/radiation/quality/duration/timing/severity/associated sxs/prior Treatment) HPI Tricia [name] is a 70 y.o. female presents to emergency department after a fall last night. Patient states it was a mechanical fall, states she was walking and tripped over a pillow that was on the floor. She states she landed on the right hip. She denies hitting her head a loss of consciousness. No dizziness, cp, sob, headache prior to fall. States she is unable to walk on that leg. States she has been using crutches. Has not taken anything for pain. No history of hip problems. No numbness or weakness to the leg. No back pain. No knee pain  History reviewed. No pertinent past medical history. Past Surgical History  Procedure Laterality Date  . Ovarian cyst removal  1973   Family History  Problem Relation Age of Onset  . Leukemia Mother   . Goiter Mother   . Stroke Father   . Cancer Father   . Heart attack Father    Social History  Substance Use Topics  . Smoking status: Never Smoker   . Smokeless tobacco: None  . Alcohol Use: No   OB History    No data available     Review of Systems  Constitutional: Negative for fever and chills.  Respiratory: Negative for cough, chest tightness and shortness of breath.   Cardiovascular: Negative for chest pain, palpitations and leg swelling.  Musculoskeletal: Positive for arthralgias. Negative for neck pain and neck stiffness.  Skin: Negative for rash.  Neurological: Negative for dizziness, weakness and headaches.  All other systems reviewed and are negative.     Allergies  Sulfa antibiotics; Iodine; and Penicillins  Home Medications   Prior to Admission medications   Medication Sig Start Date End Date Taking? Authorizing Provider  Ascorbic Acid  (VITAMIN C PO) Take 1 tablet by mouth daily.    Yes Historical Provider, MD  B Complex Vitamins (VITAMIN B COMPLEX PO) Take 1 tablet by mouth daily.    Yes Historical Provider, MD  Cholecalciferol (VITAMIN D PO) Take 5,000 Units by mouth daily.    Yes Historical Provider, MD  Multiple Vitamins-Minerals (MULTIVITAMIN PO) Take 1 tablet by mouth daily.    Yes Historical Provider, MD  Omega-3 Fatty Acids (OMEGA 3 PO) Take 500 mg by mouth 2 (two) times daily.   Yes Historical Provider, MD  OVER THE COUNTER MEDICATION Take 1,000 mg by mouth daily. Calcium  Citrate   Yes Historical Provider, MD  OVER THE COUNTER MEDICATION Take 500 mg by mouth daily. Magnesium   Yes Historical Provider, MD  OVER THE COUNTER MEDICATION Enzyme with HCl and pepsid daily   Yes Historical Provider, MD  OVER THE COUNTER MEDICATION Iodine 1 drops daily   Yes Historical Provider, MD  OVER THE COUNTER MEDICATION Trace minerals 20 drops a day   Yes Historical Provider, MD  OVER THE COUNTER MEDICATION thytrophen  3 per day.   Yes Historical Provider, MD  OVER THE COUNTER MEDICATION Take 2 tablets by mouth daily. Colostrum   Yes Historical Provider, MD  OVER THE COUNTER MEDICATION DGL Licorice 1 cap before meals.   Yes Historical Provider, MD  Probiotic Product (PROBIOTIC DAILY PO) Take 1 tablet by mouth daily.    Yes Historical Provider, MD  Pyridoxine  HCl (VITAMIN B-6) 500 MG tablet Take 500 mg by mouth daily.   Yes Historical Provider, MD   BP 95/52 mmHg  Pulse 61  Temp(Src) 98.2 F (36.8 C) (Oral)  Resp 14  SpO2 100% Physical Exam  Constitutional: She is oriented to person, place, and time. She appears well-developed and well-nourished. No distress.  HENT:  Head: Normocephalic.  Eyes: Conjunctivae are normal.  Neck: Neck supple.  Cardiovascular: Normal rate, regular rhythm and normal heart sounds.   Pulmonary/Chest: Effort normal and breath sounds normal. No respiratory distress. She has no wheezes. She has no rales.   Musculoskeletal: She exhibits no edema.  ttp over right hip over greater trochanter and anterior groin. Full ROM of the hip. Pain with external rotation. Normal knee and no ttp over lumbar spine.   Neurological: She is alert and oriented to person, place, and time.  Skin: Skin is warm and dry.  Psychiatric: She has a normal mood and affect. Her behavior is normal.  Nursing note and vitals reviewed.   ED Course  Procedures (including critical care time) Labs Review Labs Reviewed - No data to display  Imaging Review Ct Hip Right Wo Contrast  06/25/2015  CLINICAL DATA:  Fall.  Right hip injury.  Tenderness.  Ongoing pain. EXAM: CT OF THE RIGHT HIP WITHOUT CONTRAST TECHNIQUE: Multidetector CT imaging of the right hip was performed according to the standard protocol. Multiplanar CT image reconstructions were also generated. COMPARISON:  06/25/2015 radiographs FINDINGS: Several small bone islands are present in the right iliac bone. 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 Mild anterior spurring of the right SI joint. No SI joint widening. Fluid along the left posterior margin of the uterus could reflect free fluid or a left ovarian cyst. Mild cortical irregularity along the super surface of the midportion of the right inferior pubic ramus, image 45 series 2, suspicious for a small nondisplaced fracture. I do not visualize a definite corresponding superior ramus fracture although generally the superior ramus would also be fractured leading me to suspect a possible occult superior ramus fracture. Today's exam does not show a femoral neck fracture. IMPRESSION: 1. Nondisplaced right inferior pubic ramus fracture in the central portion. I suspect that there is probably an occult superior ramus fracture or pubic body fracture although such as not directly seen on today's exam. 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 3. Fluid density lesion posterolateral to the uterus could be from a ovarian cyst or free fluid. Either  would be abnormal. Consider dedicated pelvic sonography for further workup 1 the patient can tolerate. Electronically Signed   By: Van Clines M.D.   On: 06/25/2015 17:11   Dg Hip Unilat  With Pelvis 2-3 Views Right  06/25/2015  CLINICAL DATA:  Fall today as tripped over a pillow falling onto right hip with subsequent right hip pain. EXAM: DG HIP (WITH OR WITHOUT PELVIS) 2-3V RIGHT COMPARISON:  None. FINDINGS: Exam demonstrates mild symmetric degenerative changes of the hips. No evidence of acute fracture or dislocation. IMPRESSION: No acute findings. Electronically Signed   By: Marin Olp M.D.   On: 06/25/2015 15:18   I have personally reviewed and evaluated these images and lab results as part of my medical decision-making.   EKG Interpretation None      MDM   Final diagnoses:  Pubic ramus fracture, right, closed, initial encounter ([place])   Pt is here after a fall last night. Fall is mechanical. No head injury. Not anticoagulated. Pt unable to  bear weight. X-rays negative. Will get CT for further evaluation.  CT showing nondisplaced right inferior pubic ramus fracture. Patient is bleeding with crutches with no difficulty. She would like to go home, will discharge her home with Norco and ibuprofen for pain, follow-up with orthopedic specialist. Return precautions discussed.  Filed Vitals:   06/25/15 1332 06/25/15 1726 06/25/15 1754  BP: 95/52 101/50 107/44  Pulse: 61 52 65  Temp: 98.2 F (36.8 C)  98.6 F (37 C)  TempSrc: Oral  Oral  Resp: 14 16 18   SpO2: 100% 98% 99%     Jeannett Senior, PA-C 06/26/15 0116  Davonna Belling, MD 06/28/15 [phone]

## 2015-06-25 NOTE — ED Notes (Signed)
Patient presents for fall and right hip injury. Reports tripping on pillow at home, falling on carpeted floor, denies LOC, denies anticoagulants. Right hip tender to palpation, also c/o right going pain, no shortening or rotation noted to same. Rates pain 5/10.

## 2015-06-28 DIAGNOSIS — S32591A Other specified fracture of right pubis, initial encounter for closed fracture: Secondary | ICD-10-CM | POA: Diagnosis not present

## 2015-07-26 DIAGNOSIS — S32591D Other specified fracture of right pubis, subsequent encounter for fracture with routine healing: Secondary | ICD-10-CM | POA: Diagnosis not present

## 2015-08-28 DIAGNOSIS — S32591D Other specified fracture of right pubis, subsequent encounter for fracture with routine healing: Secondary | ICD-10-CM | POA: Diagnosis not present

## 2015-08-28 DIAGNOSIS — M25522 Pain in left elbow: Secondary | ICD-10-CM | POA: Diagnosis not present

## 2015-09-17 ENCOUNTER — Other Ambulatory Visit: Payer: Self-pay | Admitting: *Deleted

## 2015-09-17 DIAGNOSIS — E2839 Other primary ovarian failure: Secondary | ICD-10-CM

## 2015-09-17 DIAGNOSIS — Z780 Asymptomatic menopausal state: Secondary | ICD-10-CM

## 2015-10-04 ENCOUNTER — Ambulatory Visit
Admission: RE | Admit: 2015-10-04 | Discharge: 2015-10-04 | Disposition: A | Discharge status: Home / Self Care | Payer: PPO | Source: Ambulatory Visit | Attending: Internal Medicine | Admitting: Internal Medicine

## 2015-10-04 DIAGNOSIS — E2839 Other primary ovarian failure: Secondary | ICD-10-CM

## 2015-10-04 DIAGNOSIS — M810 Age-related osteoporosis without current pathological fracture: Secondary | ICD-10-CM | POA: Diagnosis not present

## 2015-10-04 DIAGNOSIS — Z780 Asymptomatic menopausal state: Secondary | ICD-10-CM | POA: Diagnosis not present

## 2015-10-09 ENCOUNTER — Other Ambulatory Visit: Payer: Self-pay | Admitting: Internal Medicine

## 2015-10-12 ENCOUNTER — Telehealth: Payer: Self-pay | Admitting: *Deleted

## 2015-10-12 NOTE — Telephone Encounter (Signed)
Patient called back regarding treatment for osteoporosis.  She has decided that she will not start an RX medication, but will try a natural supplement for now. Dr Melford Aase is aware of her choice.

## 2015-10-31 ENCOUNTER — Ambulatory Visit (INDEPENDENT_AMBULATORY_CARE_PROVIDER_SITE_OTHER): Payer: Self-pay | Admitting: Internal Medicine

## 2015-10-31 ENCOUNTER — Encounter: Payer: Self-pay | Admitting: Internal Medicine

## 2015-10-31 VITALS — BP 110/72 | HR 60 | Temp 97.7°F | Resp 16 | Ht 61.5 in | Wt 97.8 lb

## 2015-10-31 DIAGNOSIS — Z1212 Encounter for screening for malignant neoplasm of rectum: Secondary | ICD-10-CM

## 2015-10-31 DIAGNOSIS — Z79899 Other long term (current) drug therapy: Secondary | ICD-10-CM

## 2015-10-31 DIAGNOSIS — Z131 Encounter for screening for diabetes mellitus: Secondary | ICD-10-CM

## 2015-10-31 DIAGNOSIS — Z0001 Encounter for general adult medical examination with abnormal findings: Secondary | ICD-10-CM | POA: Diagnosis not present

## 2015-10-31 DIAGNOSIS — R030 Elevated blood-pressure reading, without diagnosis of hypertension: Secondary | ICD-10-CM

## 2015-10-31 DIAGNOSIS — Z13220 Encounter for screening for lipoid disorders: Secondary | ICD-10-CM

## 2015-10-31 DIAGNOSIS — Z0000 Encounter for general adult medical examination without abnormal findings: Secondary | ICD-10-CM

## 2015-10-31 DIAGNOSIS — IMO0001 Reserved for inherently not codable concepts without codable children: Secondary | ICD-10-CM

## 2015-10-31 DIAGNOSIS — I10 Essential (primary) hypertension: Secondary | ICD-10-CM | POA: Diagnosis not present

## 2015-10-31 DIAGNOSIS — E559 Vitamin D deficiency, unspecified: Secondary | ICD-10-CM | POA: Diagnosis not present

## 2015-10-31 DIAGNOSIS — Z136 Encounter for screening for cardiovascular disorders: Secondary | ICD-10-CM | POA: Diagnosis not present

## 2015-10-31 DIAGNOSIS — R6889 Other general symptoms and signs: Secondary | ICD-10-CM | POA: Diagnosis not present

## 2015-10-31 LAB — CBC WITH DIFFERENTIAL/PLATELET
BASOS PCT: 1 %
Basophils Absolute: 26 cells/uL (ref 0–200)
Eosinophils Absolute: 130 cells/uL (ref 15–500)
Eosinophils Relative: 5 %
HEMATOCRIT: 40.5 % (ref 35.0–45.0)
Hemoglobin: 13.8 g/dL (ref 11.7–15.5)
LYMPHS PCT: 26 %
Lymphs Abs: 676 cells/uL — ABNORMAL LOW (ref 850–3900)
MCH: 32.1 pg (ref 27.0–33.0)
MCHC: 34.1 g/dL (ref 32.0–36.0)
MCV: 94.2 fL (ref 80.0–100.0)
MONOS PCT: 13 %
MPV: 9.3 fL (ref 7.5–12.5)
Monocytes Absolute: 338 cells/uL (ref 200–950)
NEUTROS PCT: 55 %
Neutro Abs: 1430 cells/uL — ABNORMAL LOW (ref 1500–7800)
PLATELETS: 185 10*3/uL (ref 140–400)
RBC: 4.3 MIL/uL (ref 3.80–5.10)
RDW: 12.9 % (ref 11.0–15.0)
WBC: 2.6 10*3/uL — AB (ref 3.8–10.8)

## 2015-10-31 LAB — URINALYSIS, ROUTINE W REFLEX MICROSCOPIC
BILIRUBIN URINE: NEGATIVE
Glucose, UA: NEGATIVE
HGB URINE DIPSTICK: NEGATIVE
KETONES UR: NEGATIVE
NITRITE: NEGATIVE
PROTEIN: NEGATIVE
Specific Gravity, Urine: 1.007 (ref 1.001–1.035)
pH: 7.5 (ref 5.0–8.0)

## 2015-10-31 LAB — HEMOGLOBIN A1C
Hgb A1c MFr Bld: 5.2 % (ref <5.7)
MEAN PLASMA GLUCOSE: 103 mg/dL

## 2015-10-31 LAB — URINALYSIS, MICROSCOPIC ONLY
BACTERIA UA: NONE SEEN [HPF]
CASTS: NONE SEEN [LPF]
CRYSTALS: NONE SEEN [HPF]
WBC, UA: NONE SEEN WBC/HPF (ref <=5)
Yeast: NONE SEEN [HPF]

## 2015-10-31 LAB — TSH: TSH: 1.88 mIU/L

## 2015-10-31 NOTE — Patient Instructions (Signed)

## 2015-10-31 NOTE — Progress Notes (Signed)
Patient ID: Tricia [name], female   DOB: 07/27/1945, 70 y.o.   MRN: [id number]  [facility]OL[facility]                   [name], M.D.    Uvaldo Bristle. Silverio Lay, P.A.-C      Starlyn Skeans, P.A.-C   [facility]                [address] [place], [place] [id number] Telephone [phone] Telefax [phone]  ______________________________________________________________________  Annual Screening/Preventative Visit And Comprehensive Evaluation &  Examination     This very nice 70 y.o. DWF presents for a Wellness/Preventative Visit & comprehensive evaluation and management of multiple medical co-morbidities.  Patient has been followed expectantly for elevated BP, Prediabetes, Hyperlipidemia and Vitamin D Deficiency.     Patient is monitored expectantly for elevated BP. Patient's BP has been controlled at home and patient denies any cardiac symptoms as chest pain, palpitations, shortness of breath, dizziness or ankle swelling. Today's BP is  110/72 mmHg      Patient's hyperlipidemia is controlled with diet. Last lipids were at goal with T Chol 175, HDL 101, LDL 66 & Trig 40.       Patient is monitored & screened expectantly for prediabetes and patient denies reactive hypoglycemic symptoms, visual blurring, diabetic polys, or paresthesias. Last A1c was 5.5% - Normal.      Finally, patient has history of Vitamin D Deficiency and last Vitamin D was 75.   Medication Sig  . VITAMIN C  Take 1 tablet by mouth daily.   Marland Kitchen VITAMIN B COMPLEX  Take 1 tablet by mouth daily.   Marland Kitchen VITAMIN D Take 5,000 Units by mouth daily.   . MultiVit-Min Take 1 tablet by mouth daily.   . Omega-3 OMEGA 3  Take 500 mg by mouth 2 (two) times daily.  . Calcium  Citrate Take 1,000 mg by mouth daily.   . Magnesium 500 mg Take by mouth daily.   . Enzyme w/ HCl & pepsid  daily  . Iodine  1 drops daily  . Trace minerals  20 drops a day  .  thytrophen   3 per day.  . DGL Licorice  1 cap before meals.  Marland Kitchen PROBIOTIC Take 1 tablet by mouth daily.   . Pyridoxine (VIT B-6) 500 MG  Take 500 mg by mouth daily.   Allergies  Allergen Reactions  . Sulfa Antibiotics     GI problems  . Iodine Rash    Patient states she had a rash with IV contrast  . Penicillins Rash    Health Maintenance  Topic Date Due  . Hepatitis C Screening  1945-05-14  . TETANUS/TDAP  09/30/1964  . MAMMOGRAM  10/01/1995  . COLONOSCOPY  10/01/1995  . ZOSTAVAX  09/30/2005  . PNA vac Low Risk Adult (1 of 2 - PCV13) 10/01/2010  . INFLUENZA VACCINE  11/13/2015  . DEXA SCAN  Completed   Past Surgical History  Procedure Laterality Date  . Ovarian cyst removal  1973   Family History  Problem Relation Age of Onset  . Leukemia Mother   . Goiter Mother   . Stroke Father   . Cancer Father   . Heart attack Father    Social History  Substance Use Topics  . Smoking status: Never Smoker   . Smokeless tobacco: Not  on file  . Alcohol Use: No    ROS Constitutional: Denies fever, chills, weight loss/gain, headaches, insomnia,  night sweats, and change in appetite. Does c/o fatigue. Eyes: Denies redness, blurred vision, diplopia, discharge, itchy, watery eyes.  ENT: Denies discharge, congestion, post nasal drip, epistaxis, sore throat, earache, hearing loss, dental pain, Tinnitus, Vertigo, Sinus pain, snoring.  Cardio: Denies chest pain, palpitations, irregular heartbeat, syncope, dyspnea, diaphoresis, orthopnea, PND, claudication, edema Respiratory: denies cough, dyspnea, DOE, pleurisy, hoarseness, laryngitis, wheezing.  Gastrointestinal: Denies dysphagia, heartburn, reflux, water brash, pain, cramps, nausea, vomiting, bloating, diarrhea, constipation, hematemesis, melena, hematochezia, jaundice, hemorrhoids Genitourinary: Denies dysuria, frequency, urgency, nocturia, hesitancy, discharge, hematuria, flank pain Breast: Breast lumps, nipple discharge, bleeding.   Musculoskeletal: Denies arthralgia, myalgia, stiffness, Jt. Swelling, pain, limp, and strain/sprain. Denies falls. Skin: Denies puritis, rash, hives, warts, acne, eczema, changing in skin lesion Neuro: No weakness, tremor, incoordination, spasms, paresthesia, pain Psychiatric: Denies confusion, memory loss, sensory loss. Denies Depression. Endocrine: Denies change in weight, skin, hair change, nocturia, and paresthesia, diabetic polys, visual blurring, hyper / hypo glycemic episodes.  Heme/Lymph: No excessive bleeding, bruising, enlarged lymph nodes.  Physical Exam  BP 110/72   Pulse 60  Temp 97.7 F   Resp 16  Ht 5' 1.5" Wt 97 lb 12.8 oz   BMI 18.18  General Appearance: Well nourished appearing healthy and much younger than her chronological age and in no apparent distress. Eyes: PERRLA, EOMs, conjunctiva no swelling or erythema, normal fundi and vessels. Sinuses: No frontal/maxillary tenderness ENT/Mouth: EACs patent / TMs  nl. Nares clear without erythema, swelling, mucoid exudates. Oral hygiene is good. No erythema, swelling, or exudate. Tongue normal, non-obstructing. Tonsils not swollen or erythematous. Hearing normal.  Neck: Supple, thyroid normal. No bruits, nodes or JVD. Respiratory: Respiratory effort normal.  BS equal and clear bilateral without rales, rhonci, wheezing or stridor. Cardio: Heart sounds are normal with regular rate and rhythm and no murmurs, rubs or gallops. Peripheral pulses are normal and equal bilaterally without edema. No aortic or femoral bruits. Chest: symmetric with normal excursions and percussion. Breasts: Symmetric, without lumps, nipple discharge, retractions, or fibrocystic changes.  Abdomen: Flat, soft with bowel sounds active. Nontender, no guarding, rebound, hernias, masses, or organomegaly.  Lymphatics: Non tender without lymphadenopathy.  Musculoskeletal: Full ROM all peripheral extremities, joint stability, 5/5 strength, and normal  gait. Skin: Warm and dry without rashes, lesions, cyanosis, clubbing or  ecchymosis.  Neuro: Cranial nerves intact, reflexes equal bilaterally. Normal muscle tone, no cerebellar symptoms. Sensation intact.  Pysch: Alert and oriented X 3, normal affect, Insight and Judgment appropriate.   Assessment and Plan  1. Annual Preventative Screening Examination  - Microalbumin / creatinine urine ratio - EKG 12-Lead - [place], RETROPERITNL ABD,  LTD - POC Hemoccult Bld/Stl  - Urinalysis, Routine w reflex microscopic  - CBC with Differential/Platelet - BASIC METABOLIC PANEL WITH GFR - Hepatic function panel - Magnesium - Lipid panel - TSH - Hemoglobin A1c - Insulin, random - VITAMIN D 25 Hydroxy   2. Elevated BP, screening  - Microalbumin / creatinine urine ratio - EKG 12-Lead - [place], RETROPERITNL ABD,  LTD - TSH  3. Screening cholesterol level  - Lipid panel - TSH  4. Screening for diabetes mellitus  - Hemoglobin A1c - Insulin, random  5. Vitamin D deficiency  - VITAMIN D 25 Hydroxy   6. Screening for rectal cancer  - POC Hemoccult Bld/Stl   7. Screening for ischemic heart disease  - EKG 12-Lead  8.  Screening for AAA (aortic abdominal aneurysm)  - [place], RETROPERITNL ABD,  LTD  9. Medication management  - Urinalysis, Routine w reflex microscopic) - CBC with Differential/Platelet - BASIC METABOLIC PANEL WITH GFR - Hepatic function panel - Magnesium   Continue prudent diet as discussed, weight control, BP monitoring, regular exercise, and medications. Discussed med's effects and SE's. Screening labs and tests as requested with regular follow-up as recommended. Over 40 minutes of exam, counseling, chart review and high complex critical decision making was performed.

## 2015-11-01 LAB — INSULIN, RANDOM: INSULIN: 2.8 u[IU]/mL (ref 2.0–19.6)

## 2015-11-01 LAB — MICROALBUMIN / CREATININE URINE RATIO: CREATININE, URINE: 12 mg/dL — AB (ref 20–320)

## 2015-11-01 LAB — HEPATIC FUNCTION PANEL
ALBUMIN: 4.6 g/dL (ref 3.6–5.1)
ALK PHOS: 101 U/L (ref 33–130)
ALT: 17 U/L (ref 6–29)
AST: 26 U/L (ref 10–35)
BILIRUBIN TOTAL: 0.7 mg/dL (ref 0.2–1.2)
Bilirubin, Direct: 0.2 mg/dL (ref <=0.2)
Indirect Bilirubin: 0.5 mg/dL (ref 0.2–1.2)
TOTAL PROTEIN: 6.7 g/dL (ref 6.1–8.1)

## 2015-11-01 LAB — BASIC METABOLIC PANEL WITH GFR
BUN: 10 mg/dL (ref 7–25)
CALCIUM: 9.6 mg/dL (ref 8.6–10.4)
CO2: 24 mmol/L (ref 20–31)
Chloride: 103 mmol/L (ref 98–110)
Creat: 0.67 mg/dL (ref 0.60–0.93)
GFR, EST NON AFRICAN AMERICAN: 89 mL/min (ref >=60)
GLUCOSE: 73 mg/dL (ref 65–99)
POTASSIUM: 3.8 mmol/L (ref 3.5–5.3)
Sodium: 140 mmol/L (ref 135–146)

## 2015-11-01 LAB — LIPID PANEL
CHOLESTEROL: 188 mg/dL (ref 125–200)
HDL: 94 mg/dL (ref >=46)
LDL Cholesterol: 82 mg/dL (ref <130)
TRIGLYCERIDES: 62 mg/dL (ref <150)
Total CHOL/HDL Ratio: 2 Ratio (ref <=5.0)
VLDL: 12 mg/dL (ref <30)

## 2015-11-01 LAB — VITAMIN D 25 HYDROXY (VIT D DEFICIENCY, FRACTURES): VIT D 25 HYDROXY: 66 ng/mL (ref 30–100)

## 2015-11-01 LAB — MAGNESIUM: Magnesium: 2.4 mg/dL (ref 1.5–2.5)

## 2015-12-12 ENCOUNTER — Ambulatory Visit: Payer: Self-pay | Admitting: Internal Medicine

## 2016-01-07 ENCOUNTER — Ambulatory Visit (INDEPENDENT_AMBULATORY_CARE_PROVIDER_SITE_OTHER): Payer: PPO | Admitting: Internal Medicine

## 2016-01-07 ENCOUNTER — Encounter: Payer: Self-pay | Admitting: Internal Medicine

## 2016-01-07 VITALS — BP 106/54 | HR 60 | Temp 97.6°F | Resp 16 | Ht 62.0 in | Wt 98.6 lb

## 2016-01-07 DIAGNOSIS — Z0000 Encounter for general adult medical examination without abnormal findings: Secondary | ICD-10-CM

## 2016-01-07 DIAGNOSIS — Z0001 Encounter for general adult medical examination with abnormal findings: Secondary | ICD-10-CM | POA: Diagnosis not present

## 2016-01-07 DIAGNOSIS — R030 Elevated blood-pressure reading, without diagnosis of hypertension: Secondary | ICD-10-CM | POA: Diagnosis not present

## 2016-01-07 DIAGNOSIS — R6889 Other general symptoms and signs: Secondary | ICD-10-CM | POA: Diagnosis not present

## 2016-01-07 DIAGNOSIS — IMO0001 Reserved for inherently not codable concepts without codable children: Secondary | ICD-10-CM

## 2016-01-07 DIAGNOSIS — E559 Vitamin D deficiency, unspecified: Secondary | ICD-10-CM

## 2016-01-07 NOTE — Progress Notes (Signed)
MEDICARE ANNUAL WELLNESS VISIT AND FOLLOW UP  Assessment:    1. Routine general medical examination at a health care facility -medicare wellness due next year  2. Elevated BP -well controlled -cont dash diet -no need for meds currently  3. Vitamin D deficiency -cont vit D def -normal testing at most recent physical.     Over 30 minutes of exam, counseling, chart review, and critical decision making was performed  Future Appointments Date Time Provider Department Center  11/27/2016 9:00 AM Lucky CowboyWilliam McKeown, MD [facility] None    Plan:   During the course of the visit the patient was educated and counseled about appropriate screening and preventive services including:    Pneumococcal vaccine   Influenza vaccine  Td vaccine  Prevnar 13  Screening electrocardiogram  Screening mammography  Bone densitometry screening  Colorectal cancer screening  Diabetes screening  Glaucoma screening  Nutrition counseling   Advanced directives: given info/requested copies   Subjective:   Tricia PrimaSybil G [name] is a 70 y.o. female who presents for Medicare Annual Wellness Visit and 3 month follow up on hypertension, prediabetes, hyperlipidemia, vitamin D def.   Her blood pressure has been controlled at home, today their BP is BP: (!) 106/54 She does not workout. She denies chest pain, shortness of breath, dizziness.  She is not on cholesterol medication and denies myalgias. Her cholesterol is at goal. The cholesterol last visit was:   Lab Results  Component Value Date   CHOL 188 10/31/2015   HDL 94 10/31/2015   LDLCALC 82 10/31/2015   TRIG 62 10/31/2015   CHOLHDL 2.0 10/31/2015   She has a history of diet controlled glucose.  She has been screened for diabetes.  Her A1c's most recently have been normal.    Last A1C in the office was:  Lab Results  Component Value Date   HGBA1C 5.2 10/31/2015   Last GFR Lab Results  Component Value Date   GFRNONAA 89 10/31/2015   Lab  Results  Component Value Date   GFRAA >89 10/31/2015   Patient is on Vitamin D supplement. Lab Results  Component Value Date   VD25OH 66 10/31/2015     She has no complaints today.  She is declining all vaccines.  She is opposed to getting vaccines because she doesn't want to be exposed to preservatives.    She refuses further mammograms as well.    Medication Review Current Outpatient Prescriptions on File Prior to Visit  Medication Sig Dispense Refill  . Ascorbic Acid (VITAMIN C PO) Take 1 tablet by mouth daily.     . B Complex Vitamins (VITAMIN B COMPLEX PO) Take 1 tablet by mouth daily.     . Cholecalciferol (VITAMIN D PO) Take 5,000 Units by mouth daily.     . Multiple Vitamins-Minerals (MULTIVITAMIN PO) Take 1 tablet by mouth daily.     . Omega-3 Fatty Acids (OMEGA 3 PO) Take 500 mg by mouth 2 (two) times daily.    Marland Kitchen. OVER THE COUNTER MEDICATION Take 500 mg by mouth daily. Magnesium    . OVER THE COUNTER MEDICATION Enzyme with HCl and pepsid daily    . OVER THE COUNTER MEDICATION Iodine 7 drops daily    . OVER THE COUNTER MEDICATION Trace minerals 20 drops a day    . OVER THE COUNTER MEDICATION thytrophen  3 per day.    Marland Kitchen. OVER THE COUNTER MEDICATION DGL Licorice 1 cap before meals.    . Probiotic Product (PROBIOTIC DAILY PO) Take  1 tablet by mouth daily.     . Pyridoxine HCl (VITAMIN B-6) 500 MG tablet Take 500 mg by mouth daily.     No current facility-administered medications on file prior to visit.     Allergies: Allergies  Allergen Reactions  . Sulfa Antibiotics     GI problems  . Iodine Rash    Patient states she had a rash with IV contrast  . Penicillins Rash    Current Problems (verified) has Fatigue; Elevated BP; Screening cholesterol level; Vitamin D deficiency; Screening for diabetes mellitus; and Medication management on her problem list.  Screening Tests  There is no immunization history on file for this patient.  Preventative care: Last  colonoscopy: 2016 Last mammogram: Obgyn, 2016, December DEXA:2017  Prior vaccinations: TD or Tdap: Declined all vaccines  Influenza: Declined all vaccines Pneumococcal: Declined Prevnar13: Declined Shingles/Zostavax: Declined  Names of Other Physician/Practitioners you currently use: 1. [facility]- here for primary care 2. Dr. Jabier Mutton, eye doctor, last visit 2017 3. Dr. Pablo Lawrence, dentist, last visit 2017 Patient Care Team: [name], MD as PCP - General (Internal Medicine) Paula Compton, MD as Consulting Physician (Obstetrics and Gynecology) Richmond Campbell, MD as Consulting Physician (Gastroenterology)  Surgical: She  has a past surgical history that includes Ovarian cyst removal (1973). Family Her family history includes Cancer in her father; Goiter in her mother; Heart attack in her father; Leukemia in her mother; Stroke in her father. Social history  She reports that she has never smoked. She does not have any smokeless tobacco history on file. She reports that she does not drink alcohol or use drugs.  MEDICARE WELLNESS OBJECTIVES: Physical activity: Current Exercise Habits: The patient does not participate in regular exercise at present Cardiac risk factors: Cardiac Risk Factors include: advanced age (>59men, >56 women) Depression/mood screen:   Depression screen [facility] 2/9 01/07/2016  Decreased Interest 0  Down, Depressed, Hopeless 0  PHQ - 2 Score 0  Altered sleeping -  Tired, decreased energy -  Change in appetite -  Feeling bad or failure about yourself  -  Trouble concentrating -  Moving slowly or fidgety/restless -  Suicidal thoughts -  PHQ-9 Score -    ADLs:  In your present state of health, do you have any difficulty performing the following activities: 01/07/2016 10/31/2015  Hearing? N N  Vision? N N  Difficulty concentrating or making decisions? N N  Walking or climbing stairs? N N  Dressing or bathing? N N  Doing  errands, shopping? N N  Preparing Food and eating ? N -  Using the Toilet? N -  In the past six months, have you accidently leaked urine? N -  Do you have problems with loss of bowel control? N -  Managing your Medications? N -  Managing your Finances? N -  Housekeeping or managing your Housekeeping? N -  Some recent data might be hidden     Cognitive Testing  Alert? Yes  Normal Appearance?Yes  Oriented to person? Yes  Place? Yes   Time? Yes  Recall of three objects?  Yes  Can perform simple calculations? Yes  Displays appropriate judgment?Yes  Can read the correct time from a watch face?Yes  EOL planning: Does patient have an advance directive?: No Type of Advance Directive: Healthcare Power of Attorney, Living will Would patient like information on creating an advanced directive?: No - patient declined information   Objective:   Today's Vitals   01/07/16 1059  BP: (!) 106/54  Pulse: 60  Resp: 16  Temp: 97.6 F (36.4 C)  Weight: 98 lb 9.6 oz (44.7 kg)  Height: 5\' 2"  (1.575 m)   Body mass index is 18.03 kg/m.  General appearance: alert, no distress, WD/WN,  female HEENT: normocephalic, sclerae anicteric, TMs pearly, nares patent, no discharge or erythema, pharynx normal Oral cavity: MMM, no lesions Neck: supple, no lymphadenopathy, no thyromegaly, no masses Heart: RRR, normal S1, S2, no murmurs Lungs: CTA bilaterally, no wheezes, rhonchi, or rales Abdomen: +bs, soft, non tender, non distended, no masses, no hepatomegaly, no splenomegaly Musculoskeletal: nontender, no swelling, no obvious deformity Extremities: no edema, no cyanosis, no clubbing Pulses: 2+ symmetric, upper and lower extremities, normal cap refill Neurological: alert, oriented x 3, CN2-12 intact, strength normal upper extremities and lower extremities, sensation normal throughout, DTRs 2+ throughout, no cerebellar signs, gait normal Psychiatric: normal affect, behavior normal, pleasant  Breast:  defer Gyn: defer Rectal: defer   Medicare Attestation I have personally reviewed: The patient's medical and social history Their use of alcohol, tobacco or illicit drugs Their current medications and supplements The patient's functional ability including ADLs,fall risks, home safety risks, cognitive, and hearing and visual impairment Diet and physical activities Evidence for depression or mood disorders  The patient's weight, height, BMI, and visual acuity have been recorded in the chart.  I have made referrals, counseling, and provided education to the patient based on review of the above and I have provided the patient with a written personalized care plan for preventive services.     Starlyn Skeans, PA-C   01/07/2016

## 2016-01-24 ENCOUNTER — Other Ambulatory Visit: Payer: Self-pay | Admitting: *Deleted

## 2016-01-24 DIAGNOSIS — Z1212 Encounter for screening for malignant neoplasm of rectum: Secondary | ICD-10-CM

## 2016-01-24 DIAGNOSIS — Z0001 Encounter for general adult medical examination with abnormal findings: Secondary | ICD-10-CM

## 2016-01-24 LAB — POC HEMOCCULT BLD/STL (HOME/3-CARD/SCREEN)
Card #3 Fecal Occult Blood, POC: NEGATIVE
FECAL OCCULT BLD: NEGATIVE
Fecal Occult Blood, POC: NEGATIVE

## 2016-01-29 ENCOUNTER — Ambulatory Visit (INDEPENDENT_AMBULATORY_CARE_PROVIDER_SITE_OTHER): Payer: PPO | Admitting: Physician Assistant

## 2016-01-29 ENCOUNTER — Encounter: Payer: Self-pay | Admitting: Physician Assistant

## 2016-01-29 VITALS — BP 118/64 | HR 77 | Temp 99.2°F | Resp 16 | Ht 62.0 in | Wt 99.2 lb

## 2016-01-29 DIAGNOSIS — M791 Myalgia, unspecified site: Secondary | ICD-10-CM

## 2016-01-29 DIAGNOSIS — R531 Weakness: Secondary | ICD-10-CM | POA: Diagnosis not present

## 2016-01-29 DIAGNOSIS — R350 Frequency of micturition: Secondary | ICD-10-CM

## 2016-01-29 LAB — CBC WITH DIFFERENTIAL/PLATELET
BASOS PCT: 0 %
Basophils Absolute: 0 cells/uL (ref 0–200)
EOS PCT: 1 %
Eosinophils Absolute: 41 cells/uL (ref 15–500)
HCT: 36.5 % (ref 35.0–45.0)
Hemoglobin: 12.5 g/dL (ref 11.7–15.5)
Lymphocytes Relative: 27 %
Lymphs Abs: 1107 cells/uL (ref 850–3900)
MCH: 32 pg (ref 27.0–33.0)
MCHC: 34.2 g/dL (ref 32.0–36.0)
MCV: 93.4 fL (ref 80.0–100.0)
MONOS PCT: 9 %
MPV: 9.3 fL (ref 7.5–12.5)
Monocytes Absolute: 369 cells/uL (ref 200–950)
NEUTROS ABS: 2583 {cells}/uL (ref 1500–7800)
Neutrophils Relative %: 63 %
PLATELETS: 157 10*3/uL (ref 140–400)
RBC: 3.91 MIL/uL (ref 3.80–5.10)
RDW: 12.5 % (ref 11.0–15.0)
WBC: 4.1 10*3/uL (ref 3.8–10.8)

## 2016-01-29 MED ORDER — CIPROFLOXACIN HCL 500 MG PO TABS: 500.0000 mg | ORAL_TABLET | Freq: Two times a day (BID) | ORAL | 0 refills | Status: DC

## 2016-01-29 NOTE — Patient Instructions (Addendum)
Can try to take zantac for the throat/getting caught  Vascular Dementia Vascular dementia is a common cause of dementia in the elderly. Dementia is a condition that affects the brain and causes people to not think well or act normally. Vascular dementia is one type of dementia. It occurs when blood clots block small blood vessels in the brain and destroy brain tissue. Likely risk factors are high blood pressure and advanced age. This disease can cause stroke, migraine-like headaches, and psychiatric disturbances.  SYMPTOMS   Confusion.  Problems with recent memory.  Wandering or getting lost in familiar places.  Loss of bladder or bowel control (incontinence).  Unsteady gait.  Poor attention and concentration.  Emotional problems such as laughing or crying inappropriately.  Difficulty following instructions.  Problems handling money.  Depression.  Difficulty planning ahead. Usually the damage is slight at first. Over time, as more small vessels are blocked, there is a gradual mental decline. However, symptoms may begin suddenly. Symptoms may be very similar to Alzheimer's disease. The two forms of dementia may occur together. Vascular dementia typically begins between the ages of 17 and 19. It affects men more often than women. TREATMENT   Currently there is no treatment for vascular dementia that can reverse the damage that has already occurred.  Treatment focuses on prevention of additional brain damage and improvement of symptoms.  It is important to treat the risk factors for vascular dementia, such as keeping blood pressure under control, treating diabetes, lowering cholesterol, and stop smoking. PROGNOSIS   Prognosis for patients is generally poor. Individuals with the disease may improve for short periods of time, then get worse again. Early treatment and management of blood pressure and other risk factors may prevent further worsening of the disorder.   This information  is not intended to replace advice given to you by your health care provider. Make sure you discuss any questions you have with your health care provider.   Document Released: 03/21/2002 Document Revised: 06/23/2011 Document Reviewed: 07/12/2014 Elsevier Interactive Patient Education [employer].

## 2016-01-29 NOTE — Progress Notes (Signed)
Subjective:    Patient ID: Tricia [name], female    DOB: 12-21-1945, 70 y.o.   MRN: [id number]  HPI 70 y.o. WF has history of UTI's states that last Monday she started to feel badly, worse yesterday. She has body aches, back pain, leg/calves pain, joint pain, cloudy urine, frequency with decreased amount, started with low grade temperature today.   She has history of prolapse and follows with Dr. Marvel Plan and they want to do surgery.   Blood pressure 118/64, pulse 77, temperature 99.2 F (37.3 C), resp. rate 16, height 5\' 2"  (1.575 m), weight 99 lb 3.2 oz (45 kg), SpO2 98 %.  Medications Current Outpatient Prescriptions on File Prior to Visit  Medication Sig  . Ascorbic Acid (VITAMIN C PO) Take 1 tablet by mouth daily.   . B Complex Vitamins (VITAMIN B COMPLEX PO) Take 1 tablet by mouth daily.   . Cholecalciferol (VITAMIN D PO) Take 5,000 Units by mouth daily.   . Multiple Vitamins-Minerals (MULTIVITAMIN PO) Take 1 tablet by mouth daily.   . Omega-3 Fatty Acids (OMEGA 3 PO) Take 500 mg by mouth 2 (two) times daily.  Marland Kitchen OVER THE COUNTER MEDICATION Take 500 mg by mouth daily. Magnesium  . OVER THE COUNTER MEDICATION Enzyme with HCl and pepsid daily  . OVER THE COUNTER MEDICATION Iodine 7 drops daily  . OVER THE COUNTER MEDICATION Trace minerals 20 drops a day  . OVER THE COUNTER MEDICATION thytrophen  3 per day.  Marland Kitchen OVER THE COUNTER MEDICATION DGL Licorice 1 cap before meals.  Marland Kitchen OVER THE COUNTER MEDICATION OsteoSheath 1224 mg daily.  . Probiotic Product (PROBIOTIC DAILY PO) Take 1 tablet by mouth daily.   . Pyridoxine HCl (VITAMIN B-6) 500 MG tablet Take 500 mg by mouth daily.   No current facility-administered medications on file prior to visit.     Problem list She has Fatigue; Elevated BP; Screening cholesterol level; Vitamin D deficiency; Screening for diabetes mellitus; and Medication management on her problem list.  Review of Systems  Constitutional: Positive for  fatigue. Negative for activity change, appetite change, chills, diaphoresis, fever and unexpected weight change.  HENT: Negative.   Respiratory: Negative.   Cardiovascular: Negative.   Gastrointestinal: Negative.   Genitourinary: Positive for frequency. Negative for decreased urine volume, difficulty urinating, dyspareunia, dysuria, enuresis, flank pain, genital sores, hematuria, menstrual problem, pelvic pain, urgency, vaginal bleeding, vaginal discharge and vaginal pain.  Musculoskeletal: Positive for arthralgias, back pain and myalgias. Negative for gait problem, joint swelling, neck pain and neck stiffness.  Skin: Negative.  Negative for rash.  Neurological: Negative.   Psychiatric/Behavioral: Negative.        Objective:   Physical Exam  Constitutional: She is oriented to person, place, and time. She appears well-developed and well-nourished.  HENT:  Head: Normocephalic and atraumatic.  Right Ear: External ear normal.  Left Ear: External ear normal.  Mouth/Throat: Oropharynx is clear and moist.  Eyes: Conjunctivae and EOM are normal. Pupils are equal, round, and reactive to light.  Neck: Normal range of motion. Neck supple. No thyromegaly present.  Cardiovascular: Normal rate, regular rhythm and normal heart sounds.  Exam reveals no gallop and no friction rub.   No murmur heard. Pulmonary/Chest: Effort normal and breath sounds normal. No respiratory distress. She has no wheezes.  Abdominal: Soft. Bowel sounds are normal. She exhibits no distension and no mass. There is no tenderness. There is no rebound and no guarding.  Musculoskeletal: Normal range of motion.  Lymphadenopathy:    She has no cervical adenopathy.  Neurological: She is alert and oriented to person, place, and time. She displays normal reflexes. No cranial nerve deficit. Coordination normal.  Skin: Skin is warm and dry.  Psychiatric: She has a normal mood and affect.       Assessment & Plan:  1. Weakness Check  labs, increase fluids - CBC with Differential/Platelet - TSH - Comprehensive metabolic panel  2. Urinary frequency Will treat with cipro since having right flank pain, allergy to PCN/sulfa - Urinalysis, Routine w reflex microscopic (not at [facility]) - Urine culture

## 2016-01-30 LAB — URINALYSIS, ROUTINE W REFLEX MICROSCOPIC
Bilirubin Urine: NEGATIVE
Glucose, UA: NEGATIVE
HGB URINE DIPSTICK: NEGATIVE
Ketones, ur: NEGATIVE
NITRITE: NEGATIVE
PROTEIN: NEGATIVE
Specific Gravity, Urine: 1.014 (ref 1.001–1.035)
pH: 6.5 (ref 5.0–8.0)

## 2016-01-30 LAB — COMPREHENSIVE METABOLIC PANEL
ALK PHOS: 97 U/L (ref 33–130)
ALT: 11 U/L (ref 6–29)
AST: 20 U/L (ref 10–35)
Albumin: 4.2 g/dL (ref 3.6–5.1)
BUN: 17 mg/dL (ref 7–25)
CO2: 27 mmol/L (ref 20–31)
Calcium: 9.4 mg/dL (ref 8.6–10.4)
Chloride: 104 mmol/L (ref 98–110)
Creat: 0.72 mg/dL (ref 0.60–0.93)
GLUCOSE: 87 mg/dL (ref 65–99)
POTASSIUM: 4.2 mmol/L (ref 3.5–5.3)
Sodium: 138 mmol/L (ref 135–146)
Total Bilirubin: 0.4 mg/dL (ref 0.2–1.2)
Total Protein: 6.2 g/dL (ref 6.1–8.1)

## 2016-01-30 LAB — URINALYSIS, MICROSCOPIC ONLY
Bacteria, UA: NONE SEEN [HPF]
CASTS: NONE SEEN [LPF]
CRYSTALS: NONE SEEN [HPF]
RBC / HPF: NONE SEEN RBC/HPF (ref <=2)
YEAST: NONE SEEN [HPF]

## 2016-01-30 LAB — TSH: TSH: 0.96 m[IU]/L

## 2016-01-31 LAB — URINE CULTURE: Organism ID, Bacteria: NO GROWTH

## 2016-06-23 ENCOUNTER — Other Ambulatory Visit: Payer: Self-pay | Admitting: Internal Medicine

## 2016-09-25 DIAGNOSIS — Z1389 Encounter for screening for other disorder: Secondary | ICD-10-CM | POA: Diagnosis not present

## 2016-09-25 DIAGNOSIS — Z01419 Encounter for gynecological examination (general) (routine) without abnormal findings: Secondary | ICD-10-CM | POA: Diagnosis not present

## 2016-11-27 ENCOUNTER — Ambulatory Visit (INDEPENDENT_AMBULATORY_CARE_PROVIDER_SITE_OTHER): Payer: PPO | Admitting: Internal Medicine

## 2016-11-27 ENCOUNTER — Encounter: Payer: Self-pay | Admitting: Internal Medicine

## 2016-11-27 VITALS — BP 102/66 | HR 56 | Temp 97.5°F | Resp 16 | Ht 62.0 in | Wt 102.4 lb

## 2016-11-27 DIAGNOSIS — E559 Vitamin D deficiency, unspecified: Secondary | ICD-10-CM | POA: Diagnosis not present

## 2016-11-27 DIAGNOSIS — Z136 Encounter for screening for cardiovascular disorders: Secondary | ICD-10-CM | POA: Diagnosis not present

## 2016-11-27 DIAGNOSIS — R0989 Other specified symptoms and signs involving the circulatory and respiratory systems: Secondary | ICD-10-CM

## 2016-11-27 DIAGNOSIS — I10 Essential (primary) hypertension: Secondary | ICD-10-CM

## 2016-11-27 DIAGNOSIS — Z1212 Encounter for screening for malignant neoplasm of rectum: Secondary | ICD-10-CM

## 2016-11-27 DIAGNOSIS — E782 Mixed hyperlipidemia: Secondary | ICD-10-CM | POA: Diagnosis not present

## 2016-11-27 DIAGNOSIS — Z131 Encounter for screening for diabetes mellitus: Secondary | ICD-10-CM

## 2016-11-27 DIAGNOSIS — Z79899 Other long term (current) drug therapy: Secondary | ICD-10-CM

## 2016-11-27 DIAGNOSIS — R6889 Other general symptoms and signs: Secondary | ICD-10-CM

## 2016-11-27 DIAGNOSIS — Z0001 Encounter for general adult medical examination with abnormal findings: Secondary | ICD-10-CM

## 2016-11-27 NOTE — Patient Instructions (Signed)

## 2016-11-27 NOTE — Progress Notes (Signed)
[facility] [name], M.D.      Uvaldo Bristle. Silverio Lay, P.A.-C [facility]                [address] [place], [place] [address] Telephone [phone] Telefax [phone]  Annual Screening/Preventative Visit & Comprehensive Evaluation &  Examination     This very nice 71 y.o.female presents for a Screening/Preventative Visit & comprehensive evaluation and management of multiple medical co-morbidities.  Patient has been followed expectantly for labile  Elevated BP,  Prediabetes, Hyperlipidemia and Vitamin D Deficiency. Patient had a 3D MGM in Dec 2017 thru Dr Juliann Pulse Richardson's office. +++++++++++++++++++++++++++++++++++++++++++++++++++++++++++++++++++++++++++++++++++++++++++++++++++++  OV note from 10/17/2014 - "Patient does relate that she sees a Restaurant manager, fast food and also a [place] working in the same office in [place]  where they "hook" her up with wires to a "machine" & they diagnose her with various deficiencies , additives, "ingredients" and sell he "antidotes" to maintain her balance. 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 +++++++++++++++++++++++++++++++++++++++++++++++++++++++++++++++++++++++++++++++++++++++++++++++++++++       Patient's BP has been controlled at home and patient denies any cardiac symptoms as chest pain, palpitations, shortness of breath, dizziness or ankle swelling. Today's BP is at goal - 102/66.      Patient's lipids are controlled with diet. Patient denies myalgias or other medication SE's. Last lipids were at goal: Lab Results  Component Value Date   CHOL 188 10/31/2015   HDL 94 10/31/2015   LDLCALC 82 10/31/2015   TRIG 62 10/31/2015   CHOLHDL 2.0  10/31/2015      Patient is screened expectantly for prediabetes  and patient denies reactive hypoglycemic symptoms, visual blurring, diabetic polys, or paresthesias. Last A1c was at goal: Lab Results  Component Value Date   HGBA1C 5.2 10/31/2015      Finally, patient has history of Vitamin D Deficiency and last Vitamin D was at goal: Lab Results  Component Value Date   VD25OH 66 10/31/2015   Current Outpatient Prescriptions on File Prior to Visit  Medication Sig  . Ascorbic Acid (VITAMIN C PO) Take 1 tablet by mouth daily.   . B Complex Vitamins (VITAMIN B COMPLEX PO) Take 1 tablet by mouth daily.   . Cholecalciferol (VITAMIN D PO) Take 5,000 Units by mouth daily.   . Multiple Vitamins-Minerals (MULTIVITAMIN PO) Take 1 tablet by mouth daily.   . Omega-3 Fatty Acids (OMEGA 3 PO) Take 500 mg by mouth 2 (two) times daily.  Marland Kitchen OVER THE COUNTER MEDICATION Take 500 mg by mouth daily. Magnesium  . OVER THE COUNTER MEDICATION Enzyme with HCl and pepsid daily  . OVER THE COUNTER MEDICATION Iodine 7 drops daily  . OVER THE COUNTER MEDICATION thytrophen  3 per day.  Marland Kitchen OVER THE COUNTER MEDICATION OsteoSheath 1224 mg daily.  . Probiotic Product (PROBIOTIC DAILY PO) Take 1 tablet by mouth daily.   . Pyridoxine HCl (VITAMIN B-6) 500 MG tablet Take 500 mg by mouth daily.   No current facility-administered medications on file prior to visit.    Allergies  Allergen Reactions  . Sulfa Antibiotics     GI problems  . Iodine Rash    Patient states she  had a rash with IV contrast  . Penicillins Rash   No past medical history on file. Health Maintenance  Topic Date Due  . Fecal DNA (Cologuard)  11/05/2017  . DEXA SCAN  Completed  . INFLUENZA VACCINE  Excluded  . MAMMOGRAM  Excluded  . TETANUS/TDAP  Excluded  . Hepatitis C Screening  Excluded  . PNA vac Low Risk Adult  Excluded    There is no immunization history on file for this patient. Past Surgical History:  Procedure Laterality Date  .  OVARIAN CYST REMOVAL  1973   Family History  Problem Relation Age of Onset  . Leukemia Mother   . Goiter Mother   . Stroke Father   . Cancer Father   . Heart attack Father    Social History  Substance Use Topics  . Smoking status: Never Smoker  . Smokeless tobacco: Not on file  . Alcohol use No    ROS Constitutional: Denies fever, chills, weight loss/gain, headaches, insomnia,  night sweats, and change in appetite. Does c/o fatigue. Eyes: Denies redness, blurred vision, diplopia, discharge, itchy, watery eyes.  ENT: Denies discharge, congestion, post nasal drip, epistaxis, sore throat, earache, hearing loss, dental pain, Tinnitus, Vertigo, Sinus pain, snoring.  Cardio: Denies chest pain, palpitations, irregular heartbeat, syncope, dyspnea, diaphoresis, orthopnea, PND, claudication, edema Respiratory: denies cough, dyspnea, DOE, pleurisy, hoarseness, laryngitis, wheezing.  Gastrointestinal: Denies dysphagia, heartburn, reflux, water brash, pain, cramps, nausea, vomiting, bloating, diarrhea, constipation, hematemesis, melena, hematochezia, jaundice, hemorrhoids Genitourinary: Denies dysuria, frequency, urgency, nocturia, hesitancy, discharge, hematuria, flank pain Breast: Breast lumps, nipple discharge, bleeding.  Musculoskeletal: Denies arthralgia, myalgia, stiffness, Jt. Swelling, pain, limp, and strain/sprain. Denies falls. Skin: Denies puritis, rash, hives, warts, acne, eczema, changing in skin lesion Neuro: No weakness, tremor, incoordination, spasms, paresthesia, pain Psychiatric: Denies confusion, memory loss, sensory loss. Denies Depression. Endocrine: Denies change in weight, skin, hair change, nocturia, and paresthesia, diabetic polys, visual blurring, hyper / hypo glycemic episodes.  Heme/Lymph: No excessive bleeding, bruising, enlarged lymph nodes.  Physical Exam  BP 102/66   Pulse (!) 56   Temp (!) 97.5 F (36.4 C)   Resp 16   Ht 5\' 2"  (1.575 m)   Wt 102 lb 6.4 oz  (46.4 kg)   BMI 18.73 kg/m   General Appearance: Well nourished, well groomed and in no apparent distress.  Eyes: PERRLA, EOMs, conjunctiva no swelling or erythema, normal fundi and vessels. Sinuses: No frontal/maxillary tenderness ENT/Mouth: EACs patent / TMs  nl. Nares clear without erythema, swelling, mucoid exudates. Oral hygiene is good. No erythema, swelling, or exudate. Tongue normal, non-obstructing. Tonsils not swollen or erythematous. Hearing normal.  Neck: Supple, thyroid normal. No bruits, nodes or JVD. Respiratory: Respiratory effort normal.  BS equal and clear bilateral without rales, rhonci, wheezing or stridor. Cardio: Heart sounds are normal with regular rate and rhythm and no murmurs, rubs or gallops. Peripheral pulses are normal and equal bilaterally without edema. No aortic or femoral bruits. Chest: symmetric with normal excursions and percussion. Breasts: Symmetric, without lumps, nipple discharge, retractions, or fibrocystic changes.  Abdomen: Flat, soft with bowel sounds active. Nontender, no guarding, rebound, hernias, masses, or organomegaly.  Lymphatics: Non tender without lymphadenopathy.  Musculoskeletal: Full ROM all peripheral extremities, joint stability, 5/5 strength, and normal gait. Skin: Warm and dry without rashes, lesions, cyanosis, clubbing or  ecchymosis.  Neuro: Cranial nerves intact, reflexes equal bilaterally. Normal muscle tone, no cerebellar symptoms. Sensation intact.  Pysch: Alert and oriented X 3, normal affect, Insight  and Judgment appropriate.   Assessment and Plan  1. Annual Preventative Screening Examination  2. Labile hypertension  - EKG 12-Lead - Urinalysis, Routine w reflex microscopic - Microalbumin / creatinine urine ratio - CBC with Differential/Platelet - BASIC METABOLIC PANEL WITH GFR - Magnesium - TSH  3. Hyperlipidemia, mixed  - EKG 12-Lead - Hepatic function panel - Lipid panel - TSH  4. Screening for diabetes  mellitus  - Hemoglobin A1c - Insulin, random  5. Vitamin D deficiency  - VITAMIN D 25 Hydroxy  6. Screening for rectal cancer  - POC Hemoccult Bld/Stl  7. Screening for ischemic heart disease  - EKG 12-Lead  8. Medication management  - Urinalysis, Routine w reflex microscopic - Microalbumin / creatinine urine ratio - CBC with Differential/Platelet - BASIC METABOLIC PANEL WITH GFR - Hepatic function panel - Magnesium - Lipid panel - TSH - Hemoglobin A1c - Insulin, random - VITAMIN D 25 Hydroxy          Patient was counseled in prudent diet to achieve/maintain BMI less than 25 for weight control, BP monitoring, regular exercise and medications. Discussed med's effects and SE's. Screening labs and tests as requested with regular follow-up as recommended. Over 40 minutes of exam, counseling, chart review and high complex critical decision making was performed.

## 2016-11-28 LAB — INSULIN, FASTING: INSULIN: 3.1 u[IU]/mL (ref 2.0–19.6)

## 2016-11-28 LAB — BASIC METABOLIC PANEL WITH GFR
BUN: 12 mg/dL (ref 7–25)
CHLORIDE: 107 mmol/L (ref 98–110)
CO2: 29 mmol/L (ref 20–32)
Calcium: 9.3 mg/dL (ref 8.6–10.4)
Creat: 0.7 mg/dL (ref 0.60–0.93)
GFR, Est African American: 101 mL/min/{1.73_m2} (ref >=60)
GFR, Est Non African American: 87 mL/min/{1.73_m2} (ref >=60)
GLUCOSE: 67 mg/dL (ref 65–99)
Potassium: 3.9 mmol/L (ref 3.5–5.3)
SODIUM: 142 mmol/L (ref 135–146)

## 2016-11-28 LAB — HEPATIC FUNCTION PANEL
AG Ratio: 2.5 (calc) (ref 1.0–2.5)
ALKALINE PHOSPHATASE (APISO): 86 U/L (ref 33–130)
ALT: 17 U/L (ref 6–29)
AST: 21 U/L (ref 10–35)
Albumin: 4.5 g/dL (ref 3.6–5.1)
BILIRUBIN INDIRECT: 0.4 mg/dL (ref 0.2–1.2)
Bilirubin, Direct: 0.2 mg/dL (ref 0.0–0.2)
Globulin: 1.8 g/dL (calc) — ABNORMAL LOW (ref 1.9–3.7)
Total Bilirubin: 0.6 mg/dL (ref 0.2–1.2)
Total Protein: 6.3 g/dL (ref 6.1–8.1)

## 2016-11-28 LAB — URINE CULTURE
MICRO NUMBER:: 80889107
RESULT: NO GROWTH
SPECIMEN QUALITY:: ADEQUATE

## 2016-11-28 LAB — CBC WITH DIFFERENTIAL/PLATELET
BASOS PCT: 0.9 %
Basophils Absolute: 29 cells/uL (ref 0–200)
EOS PCT: 1.6 %
Eosinophils Absolute: 51 cells/uL (ref 15–500)
HEMATOCRIT: 37.8 % (ref 35.0–45.0)
HEMOGLOBIN: 13 g/dL (ref 11.7–15.5)
LYMPHS ABS: 778 {cells}/uL — AB (ref 850–3900)
MCH: 32.1 pg (ref 27.0–33.0)
MCHC: 34.4 g/dL (ref 32.0–36.0)
MCV: 93.3 fL (ref 80.0–100.0)
MONOS PCT: 12.1 %
MPV: 10.4 fL (ref 7.5–12.5)
NEUTROS ABS: 1955 {cells}/uL (ref 1500–7800)
Neutrophils Relative %: 61.1 %
Platelets: 150 10*3/uL (ref 140–400)
RBC: 4.05 10*6/uL (ref 3.80–5.10)
RDW: 11.4 % (ref 11.0–15.0)
Total Lymphocyte: 24.3 %
WBC mixed population: 387 cells/uL (ref 200–950)
WBC: 3.2 10*3/uL — ABNORMAL LOW (ref 3.8–10.8)

## 2016-11-28 LAB — URINALYSIS, ROUTINE W REFLEX MICROSCOPIC
Bacteria, UA: NONE SEEN /HPF
Bilirubin Urine: NEGATIVE
Glucose, UA: NEGATIVE
HYALINE CAST: NONE SEEN /LPF
Hgb urine dipstick: NEGATIVE
Ketones, ur: NEGATIVE
Nitrite: NEGATIVE
PROTEIN: NEGATIVE
RBC / HPF: NONE SEEN /HPF (ref 0–2)
SQUAMOUS EPITHELIAL / LPF: NONE SEEN /HPF (ref <=5)
Specific Gravity, Urine: 1.005 (ref 1.001–1.03)
WBC, UA: NONE SEEN /HPF (ref 0–5)
pH: 7.5 (ref 5.0–8.0)

## 2016-11-28 LAB — LIPID PANEL
CHOL/HDL RATIO: 1.8 (calc) (ref <5.0)
Cholesterol: 179 mg/dL (ref <200)
HDL: 100 mg/dL (ref >50)
LDL Cholesterol (Calc): 65 mg/dL (calc)
Non-HDL Cholesterol (Calc): 79 mg/dL (calc) (ref <130)
Triglycerides: 62 mg/dL (ref <150)

## 2016-11-28 LAB — MICROALBUMIN / CREATININE URINE RATIO: CREATININE, URINE: 18 mg/dL — AB (ref 20–320)

## 2016-11-28 LAB — HEMOGLOBIN A1C
Hgb A1c MFr Bld: 5.2 % of total Hgb (ref <5.7)
Mean Plasma Glucose: 103 (calc)
eAG (mmol/L): 5.7 (calc)

## 2016-11-28 LAB — VITAMIN D 25 HYDROXY (VIT D DEFICIENCY, FRACTURES): Vit D, 25-Hydroxy: 47 ng/mL (ref 30–100)

## 2016-11-28 LAB — MAGNESIUM: MAGNESIUM: 2.3 mg/dL (ref 1.5–2.5)

## 2016-11-28 LAB — TSH: TSH: 0.66 mIU/L (ref 0.40–4.50)

## 2017-01-23 ENCOUNTER — Other Ambulatory Visit: Payer: Self-pay

## 2017-01-23 DIAGNOSIS — Z1212 Encounter for screening for malignant neoplasm of rectum: Secondary | ICD-10-CM

## 2017-01-23 LAB — POC HEMOCCULT BLD/STL (HOME/3-CARD/SCREEN)
Card #2 Fecal Occult Blod, POC: NEGATIVE
Card #3 Fecal Occult Blood, POC: NEGATIVE
FECAL OCCULT BLD: NEGATIVE

## 2017-02-05 ENCOUNTER — Encounter: Payer: Self-pay | Admitting: Physician Assistant

## 2017-03-13 ENCOUNTER — Ambulatory Visit: Payer: Self-pay | Admitting: Adult Health

## 2017-04-02 ENCOUNTER — Ambulatory Visit: Payer: Self-pay | Admitting: Adult Health

## 2017-04-08 NOTE — Progress Notes (Deleted)
MEDICARE ANNUAL WELLNESS VISIT AND FOLLOW UP  Assessment:   Diagnoses and all orders for this visit:  Encounter for Medicare annual wellness exam  Elevated BP without diagnosis of hypertension  Fatigue, unspecified type  Vitamin D deficiency -     VITAMIN D 25 Hydroxy (Vit-D Deficiency, Fractures)  Medication management -     CBC with Differential/Platelet -     BASIC METABOLIC PANEL WITH GFR -     Hepatic function panel   Over 40 minutes of exam, counseling, chart review and critical decision making was performed Future Appointments  Date Time Provider [place]  04/09/2017  4:00 PM Liane Comber, NP [facility] None  12/11/2017  9:00 AM [name], MD [facility] None     Plan:   During the course of the visit the patient was educated and counseled about appropriate screening and preventive services including:    Pneumococcal vaccine   Prevnar 13  Influenza vaccine  Td vaccine  Screening electrocardiogram  Bone densitometry screening  Colorectal cancer screening  Diabetes screening  Glaucoma screening  Nutrition counseling   Advanced directives: requested   Subjective:  Tricia [name] is a 71 y.o. female who presents for Medicare Annual Wellness Visit and 3 month follow up. She is followed by Dr. Paula Compton for GYN. Sees chiropractor  BMI is There is no height or weight on file to calculate BMI., she {HAS HAS KVQ:25956} been working on diet and exercise. Wt Readings from Last 3 Encounters:  11/27/16 102 lb 6.4 oz (46.4 kg)  01/29/16 99 lb 3.2 oz (45 kg)  01/07/16 98 lb 9.6 oz (44.7 kg)    Her blood pressure {HAS HAS NOT:18834} been controlled at home, today their BP is   She {DOES_DOES LOV:56433} workout. She denies chest pain, shortness of breath, dizziness.   She is not on cholesterol medication and denies myalgias. Her cholesterol is at goal. The cholesterol last visit was:   Lab Results  Component Value Date   CHOL  179 11/27/2016   HDL 100 11/27/2016   LDLCALC 82 10/31/2015   TRIG 62 11/27/2016   CHOLHDL 1.8 11/27/2016   She is managed proactively for diabetes/elevated blood glucose by this office; she denies {Symptoms; diabetes w/o none:19199}. Last A1C in the office was:  Lab Results  Component Value Date   HGBA1C 5.2 11/27/2016   Last GFR: Lab Results  Component Value Date   GFRNONAA 87 11/27/2016   Patient is on Vitamin D supplement but remained below goal at the last visit:   Lab Results  Component Value Date   VD25OH 47 11/27/2016      Medication Review: Current Outpatient Medications on File Prior to Visit  Medication Sig Dispense Refill  . Ascorbic Acid (VITAMIN C PO) Take 1 tablet by mouth daily.     . B Complex Vitamins (VITAMIN B COMPLEX PO) Take 1 tablet by mouth daily.     . Cholecalciferol (VITAMIN D PO) Take 5,000 Units by mouth daily.     . Multiple Vitamins-Minerals (MULTIVITAMIN PO) Take 1 tablet by mouth daily.     . Omega-3 Fatty Acids (OMEGA 3 PO) Take 500 mg by mouth 2 (two) times daily.    Marland Kitchen OVER THE COUNTER MEDICATION Take 500 mg by mouth daily. Magnesium    . OVER THE COUNTER MEDICATION Enzyme with HCl and pepsid daily    . OVER THE COUNTER MEDICATION Iodine 7 drops daily    . OVER THE COUNTER MEDICATION thytrophen  3  per day.    Marland Kitchen OVER THE COUNTER MEDICATION OsteoSheath 1224 mg daily.    . Probiotic Product (PROBIOTIC DAILY PO) Take 1 tablet by mouth daily.     . Pyridoxine HCl (VITAMIN B-6) 500 MG tablet Take 500 mg by mouth daily.     No current facility-administered medications on file prior to visit.     Allergies  Allergen Reactions  . Sulfa Antibiotics     GI problems  . Iodine Rash    Patient states she had a rash with IV contrast  . Penicillins Rash    Current Problems (verified) Patient Active Problem List   Diagnosis Date Noted  . Elevated BP without diagnosis of hypertension 10/31/2015  . Vitamin D deficiency 10/31/2015  . Screening for  diabetes mellitus 10/31/2015  . Medication management 10/31/2015  . Fatigue 10/13/2013    Screening Tests  There is no immunization history on file for this patient.  Last colonoscopy: *** Cologuard: 10/2014 Last mammogram: Obgyn, 03/2015 - declined this year DEXA:2017  Prior vaccinations: DECLINES ALL TD or Tdap: Declined   Influenza: Declined  Pneumococcal: Declined Prevnar13: Declined Shingles/Zostavax: Declined  Names of Other Physician/Practitioners you currently use: 1. [facility]- here for primary care 2. Dr. Jabier Mutton, eye doctor, last visit 2017 3. Dr. Pablo Lawrence, dentist, last visit 2017  Patient Care Team: [name], MD as PCP - General (Internal Medicine) Paula Compton, MD as Consulting Physician (Obstetrics and Gynecology) Richmond Campbell, MD as Consulting Physician (Gastroenterology)  SURGICAL HISTORY She  has a past surgical history that includes Ovarian cyst removal (1973). FAMILY HISTORY Her family history includes Cancer in her father; Goiter in her mother; Heart attack in her father; Leukemia in her mother; Stroke in her father. SOCIAL HISTORY She  reports that  has never smoked. she has never used smokeless tobacco. She reports that she does not drink alcohol or use drugs.   MEDICARE WELLNESS OBJECTIVES: Physical activity:   Cardiac risk factors:   Depression/mood screen:   Depression screen [facility] 2/9 11/27/2016  Decreased Interest 0  Down, Depressed, Hopeless 0  PHQ - 2 Score 0  Altered sleeping -  Tired, decreased energy -  Change in appetite -  Feeling bad or failure about yourself  -  Trouble concentrating -  Moving slowly or fidgety/restless -  Suicidal thoughts -  PHQ-9 Score -    ADLs:  In your present state of health, do you have any difficulty performing the following activities: 11/27/2016  Hearing? N  Vision? N  Difficulty concentrating or making decisions? N  Walking or climbing stairs? N   Dressing or bathing? N  Doing errands, shopping? N  Some recent data might be hidden     Cognitive Testing  Alert? Yes  Normal Appearance?Yes  Oriented to person? Yes  Place? Yes   Time? Yes  Recall of three objects?  Yes  Can perform simple calculations? Yes  Displays appropriate judgment?Yes  Can read the correct time from a watch face?Yes  EOL planning:    ROS   Objective:     There were no vitals filed for this visit. There is no height or weight on file to calculate BMI.  General appearance: alert, no distress, WD/WN, female HEENT: normocephalic, sclerae anicteric, TMs pearly, nares patent, no discharge or erythema, pharynx normal Oral cavity: MMM, no lesions Neck: supple, no lymphadenopathy, no thyromegaly, no masses Heart: RRR, normal S1, S2, no murmurs Lungs: CTA bilaterally, no wheezes, rhonchi, or rales  Abdomen: +bs, soft, non tender, non distended, no masses, no hepatomegaly, no splenomegaly Musculoskeletal: nontender, no swelling, no obvious deformity Extremities: no edema, no cyanosis, no clubbing Pulses: 2+ symmetric, upper and lower extremities, normal cap refill Neurological: alert, oriented x 3, CN2-12 intact, strength normal upper extremities and lower extremities, sensation normal throughout, DTRs 2+ throughout, no cerebellar signs, gait normal Psychiatric: normal affect, behavior normal, pleasant   Medicare Attestation I have personally reviewed: The patient's medical and social history Their use of alcohol, tobacco or illicit drugs Their current medications and supplements The patient's functional ability including ADLs,fall risks, home safety risks, cognitive, and hearing and visual impairment Diet and physical activities Evidence for depression or mood disorders  The patient's weight, height, BMI, and visual acuity have been recorded in the chart.  I have made referrals, counseling, and provided education to the patient based on review of the  above and I have provided the patient with a written personalized care plan for preventive services.     Izora Ribas, NP   04/08/2017

## 2017-04-09 ENCOUNTER — Ambulatory Visit: Payer: Self-pay | Admitting: Adult Health

## 2017-05-10 DIAGNOSIS — M810 Age-related osteoporosis without current pathological fracture: Secondary | ICD-10-CM | POA: Insufficient documentation

## 2017-05-10 NOTE — Progress Notes (Deleted)
MEDICARE ANNUAL WELLNESS VISIT AND FOLLOW UP  Assessment:   Diagnoses and all orders for this visit:  Medicare annual wellness visit, subsequent  Fatigue, unspecified type  Elevated BP without diagnosis of hypertension       Currently remains at goal        Continue DASH diet and lifestyle        No needs for medications at this time  Vitamin D deficiency Below goal at last check; continue to recommend supplementation for goal o 70-100 -     VITAMIN D 25 Hydroxy (Vit-D Deficiency, Fractures)  Medication management -     CBC with Differential/Platelet -     BASIC METABOLIC PANEL WITH GFR -     Hepatic function panel  Age related osteoporosis, unspecified pathological fracture presence       Recommended calcium and vitamin D supplementation       Recommended weight bearing and high impact exercise 2-3 times weekly       Discussed bisphosphonate therapy***  Over 40 minutes of exam, counseling, chart review and critical decision making was performed Future Appointments  Date Time Provider [place]  05/11/2017  3:00 PM Liane Comber, NP [facility] None  12/11/2017  9:00 AM [name], MD [facility] None     Plan:   During the course of the visit the patient was educated and counseled about appropriate screening and preventive services including:    Pneumococcal vaccine   Prevnar 13  Influenza vaccine  Td vaccine  Screening electrocardiogram  Bone densitometry screening  Colorectal cancer screening  Diabetes screening  Glaucoma screening  Nutrition counseling   Advanced directives: requested   Subjective:  Tricia [name] is a 72 y.o. female who presents for Medicare Annual Wellness Visit and 6 month follow up. has Fatigue; Elevated BP without diagnosis of hypertension; Vitamin D deficiency; Medication management; and Osteoporosis on their problem list.  BMI is There is no height or weight on file to calculate BMI., she {HAS HAS  FGH:82993} been working on diet and exercise. Wt Readings from Last 3 Encounters:  11/27/16 102 lb 6.4 oz (46.4 kg)  01/29/16 99 lb 3.2 oz (45 kg)  01/07/16 98 lb 9.6 oz (44.7 kg)    Her blood pressure {HAS HAS NOT:18834} been controlled at home, today their BP is   She {DOES_DOES ZJI:96789} workout. She denies chest pain, shortness of breath, dizziness.   She is not on cholesterol medication (takes omega 3 supplement) and denies myalgias. Her cholesterol is at goal. The cholesterol last visit was:   Lab Results  Component Value Date   CHOL 179 11/27/2016   HDL 100 11/27/2016   LDLCALC 82 10/31/2015   TRIG 62 11/27/2016   CHOLHDL 1.8 11/27/2016    Last A1C in the office was:  Lab Results  Component Value Date   HGBA1C 5.2 11/27/2016   Last GFR: Lab Results  Component Value Date   GFRNONAA 87 11/27/2016   Patient is on Vitamin D supplement but remains well below goal of 70-100:   Lab Results  Component Value Date   VD25OH 47 11/27/2016      Medication Review: Current Outpatient Medications on File Prior to Visit  Medication Sig Dispense Refill  . Ascorbic Acid (VITAMIN C PO) Take 1 tablet by mouth daily.     . B Complex Vitamins (VITAMIN B COMPLEX PO) Take 1 tablet by mouth daily.     . Cholecalciferol (VITAMIN D PO) Take 5,000 Units by mouth daily.     Marland Kitchen  Multiple Vitamins-Minerals (MULTIVITAMIN PO) Take 1 tablet by mouth daily.     . Omega-3 Fatty Acids (OMEGA 3 PO) Take 500 mg by mouth 2 (two) times daily.    Marland Kitchen OVER THE COUNTER MEDICATION Take 500 mg by mouth daily. Magnesium    . OVER THE COUNTER MEDICATION Enzyme with HCl and pepsid daily    . OVER THE COUNTER MEDICATION Iodine 7 drops daily    . OVER THE COUNTER MEDICATION thytrophen  3 per day.    Marland Kitchen OVER THE COUNTER MEDICATION OsteoSheath 1224 mg daily.    . Probiotic Product (PROBIOTIC DAILY PO) Take 1 tablet by mouth daily.     . Pyridoxine HCl (VITAMIN B-6) 500 MG tablet Take 500 mg by mouth daily.     No  current facility-administered medications on file prior to visit.     Allergies  Allergen Reactions  . Sulfa Antibiotics     GI problems  . Iodine Rash    Patient states she had a rash with IV contrast  . Penicillins Rash    Current Problems (verified) Patient Active Problem List   Diagnosis Date Noted  . Osteoporosis 05/10/2017  . Elevated BP without diagnosis of hypertension 10/31/2015  . Vitamin D deficiency 10/31/2015  . Medication management 10/31/2015  . Fatigue 10/13/2013    Screening Tests  There is no immunization history on file for this patient.  Preventative care: Last colonoscopy: 2016 Last mammogram: Obgyn, 2016, December Last pap smear/pelvic exam: ***   DEXA: 09/2015 R fem T -4.1  Prior vaccinations: TD or Tdap: Declined all vaccines  Influenza: Declined all vaccines Pneumococcal: Declined Prevnar13: Declined Shingles/Zostavax: Declined  Names of Other Physician/Practitioners you currently use: 1. [facility]- here for primary care 2. Dr. Jabier Mutton, eye doctor, last visit 2017 3. Dr. Pablo Lawrence, dentist, last visit 2017  Patient Care Team: [name], MD as PCP - General (Internal Medicine) Paula Compton, MD as Consulting Physician (Obstetrics and Gynecology) Richmond Campbell, MD as Consulting Physician (Gastroenterology)  SURGICAL HISTORY She  has a past surgical history that includes Ovarian cyst removal (1973). FAMILY HISTORY Her family history includes Cancer in her father; Goiter in her mother; Heart attack in her father; Leukemia in her mother; Stroke in her father. SOCIAL HISTORY She  reports that  has never smoked. she has never used smokeless tobacco. She reports that she does not drink alcohol or use drugs.   MEDICARE WELLNESS OBJECTIVES: Physical activity:   Cardiac risk factors:   Depression/mood screen:   Depression screen [facility] 2/9 11/27/2016  Decreased Interest 0  Down, Depressed, Hopeless  0  PHQ - 2 Score 0  Altered sleeping -  Tired, decreased energy -  Change in appetite -  Feeling bad or failure about yourself  -  Trouble concentrating -  Moving slowly or fidgety/restless -  Suicidal thoughts -  PHQ-9 Score -    ADLs:  In your present state of health, do you have any difficulty performing the following activities: 11/27/2016  Hearing? N  Vision? N  Difficulty concentrating or making decisions? N  Walking or climbing stairs? N  Dressing or bathing? N  Doing errands, shopping? N  Some recent data might be hidden     Cognitive Testing  Alert? Yes  Normal Appearance?Yes  Oriented to person? Yes  Place? Yes   Time? Yes  Recall of three objects?  Yes  Can perform simple calculations? Yes  Displays appropriate judgment?Yes  Can read the correct  time from a watch face?Yes  EOL planning:    Review of Systems  Constitutional: Negative for malaise/fatigue and weight loss.  HENT: Negative for hearing loss and tinnitus.   Eyes: Negative for blurred vision and double vision.  Respiratory: Negative for cough, shortness of breath and wheezing.   Cardiovascular: Negative for chest pain, palpitations, orthopnea, claudication and leg swelling.  Gastrointestinal: Negative for abdominal pain, blood in stool, constipation, diarrhea, heartburn, melena, nausea and vomiting.  Genitourinary: Negative.   Musculoskeletal: Negative for joint pain and myalgias.  Skin: Negative for rash.  Neurological: Negative for dizziness, tingling, sensory change, weakness and headaches.  Endo/Heme/Allergies: Negative for polydipsia.  Psychiatric/Behavioral: Negative.   All other systems reviewed and are negative.    Objective:     There were no vitals filed for this visit. There is no height or weight on file to calculate BMI.  General appearance: alert, no distress, WD/WN, female HEENT: normocephalic, sclerae anicteric, TMs pearly, nares patent, no discharge or erythema, pharynx  normal Oral cavity: MMM, no lesions Neck: supple, no lymphadenopathy, no thyromegaly, no masses Heart: RRR, normal S1, S2, no murmurs Lungs: CTA bilaterally, no wheezes, rhonchi, or rales Abdomen: +bs, soft, non tender, non distended, no masses, no hepatomegaly, no splenomegaly Musculoskeletal: nontender, no swelling, no obvious deformity Extremities: no edema, no cyanosis, no clubbing Pulses: 2+ symmetric, upper and lower extremities, normal cap refill Neurological: alert, oriented x 3, CN2-12 intact, strength normal upper extremities and lower extremities, sensation normal throughout, DTRs 2+ throughout, no cerebellar signs, gait normal Psychiatric: normal affect, behavior normal, pleasant   Medicare Attestation I have personally reviewed: The patient's medical and social history Their use of alcohol, tobacco or illicit drugs Their current medications and supplements The patient's functional ability including ADLs,fall risks, home safety risks, cognitive, and hearing and visual impairment Diet and physical activities Evidence for depression or mood disorders  The patient's weight, height, BMI, and visual acuity have been recorded in the chart.  I have made referrals, counseling, and provided education to the patient based on review of the above and I have provided the patient with a written personalized care plan for preventive services.     Izora Ribas, NP   05/10/2017

## 2017-05-11 ENCOUNTER — Ambulatory Visit: Payer: Self-pay | Admitting: Adult Health

## 2017-11-09 DIAGNOSIS — Z1389 Encounter for screening for other disorder: Secondary | ICD-10-CM | POA: Diagnosis not present

## 2017-11-09 DIAGNOSIS — Z01419 Encounter for gynecological examination (general) (routine) without abnormal findings: Secondary | ICD-10-CM | POA: Diagnosis not present

## 2017-11-09 DIAGNOSIS — Z681 Body mass index (BMI) 19 or less, adult: Secondary | ICD-10-CM | POA: Diagnosis not present

## 2017-11-09 DIAGNOSIS — Z124 Encounter for screening for malignant neoplasm of cervix: Secondary | ICD-10-CM | POA: Diagnosis not present

## 2017-11-09 DIAGNOSIS — N819 Female genital prolapse, unspecified: Secondary | ICD-10-CM | POA: Diagnosis not present

## 2017-11-10 DIAGNOSIS — Z124 Encounter for screening for malignant neoplasm of cervix: Secondary | ICD-10-CM | POA: Diagnosis not present

## 2017-12-03 IMAGING — CT CT HIP*R* W/O CM
4 of 5 series · 11 of 46 positions shown, 18 images · non-contrast
Comparison: 06/25/2015 radiographs

CLINICAL DATA: Fall.  Right hip injury.  Tenderness.  Ongoing pain.

EXAM:
CT OF THE RIGHT HIP WITHOUT CONTRAST
TECHNIQUE: Multidetector CT imaging of the right hip was performed according to
the standard protocol. Multiplanar CT image reconstructions were
also generated.

[Series 3: hip st · axial · 0.41mm/px · z∈[-136,-16]mm · 4 of 42 slices shown, 9 images]
[im 9/42  soft-tissue]
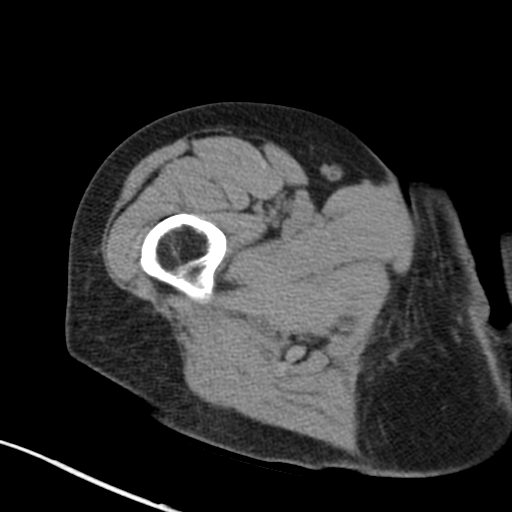
[im 9/42  lung]
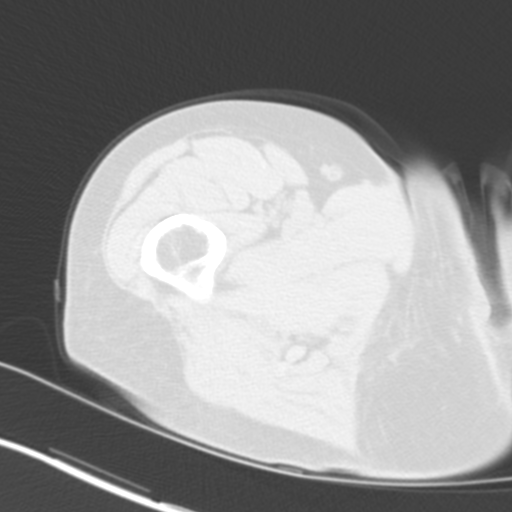
[im 9/42  bone]
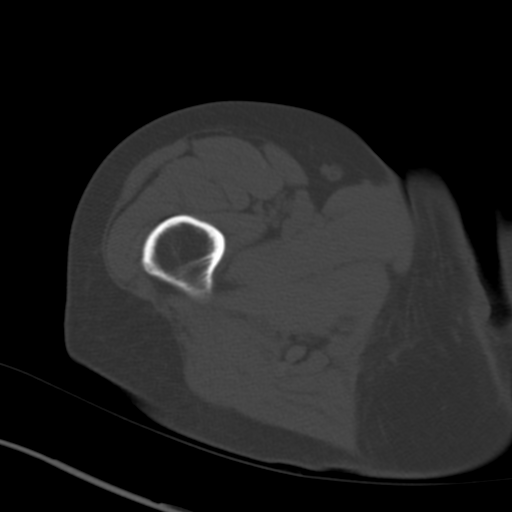
[im 17/42  soft-tissue]
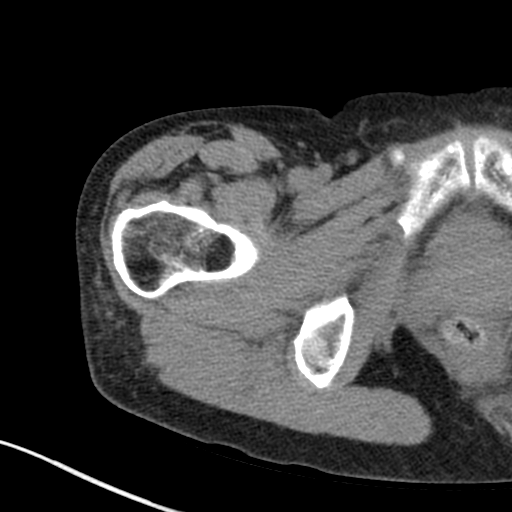
[im 17/42  lung]
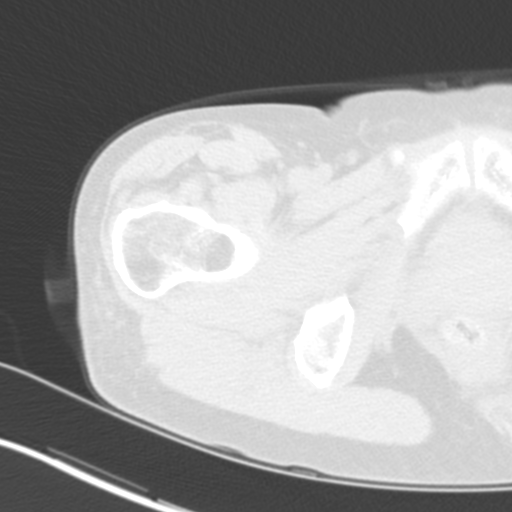
[im 25/42  soft-tissue]
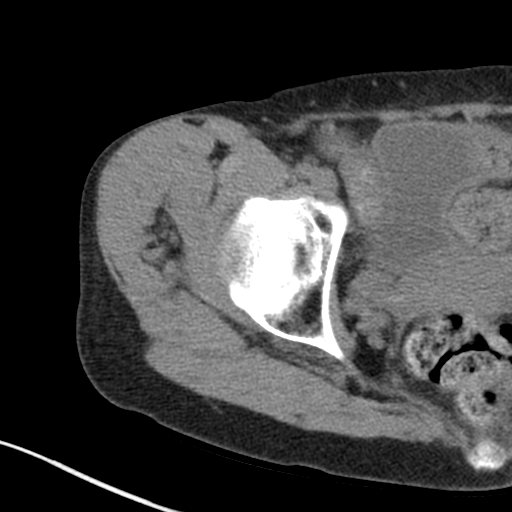
[im 25/42  lung]
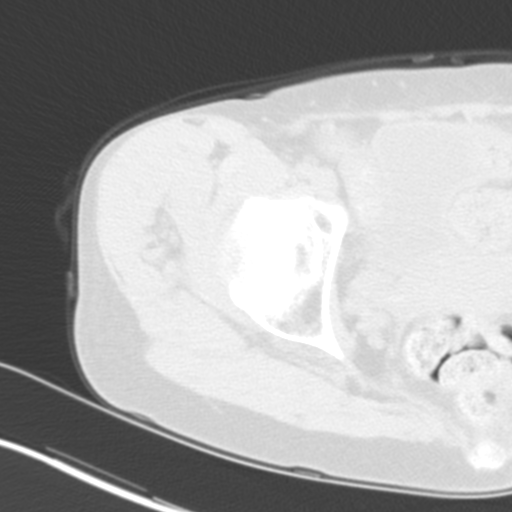
[im 33/42  soft-tissue]
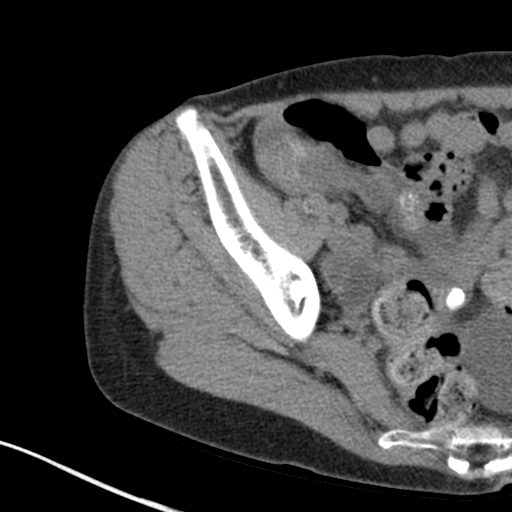
[im 33/42  lung]
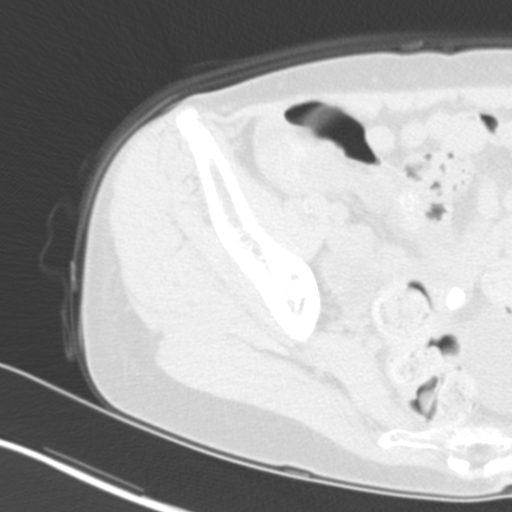

[Series 4: coronal bone · coronal · 0.39mm/px · 3 of 92 slices shown, 4 images]
[im 31/92  soft-tissue]
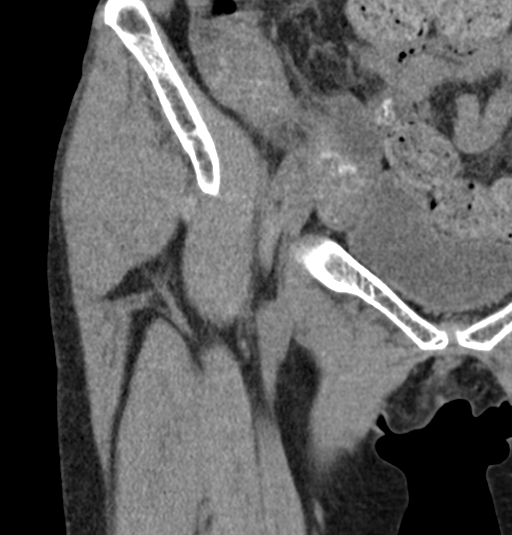
[im 41/92  soft-tissue]
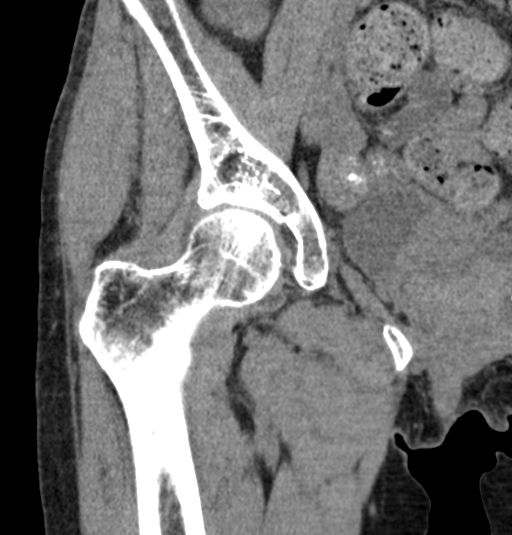
[im 41/92  bone]
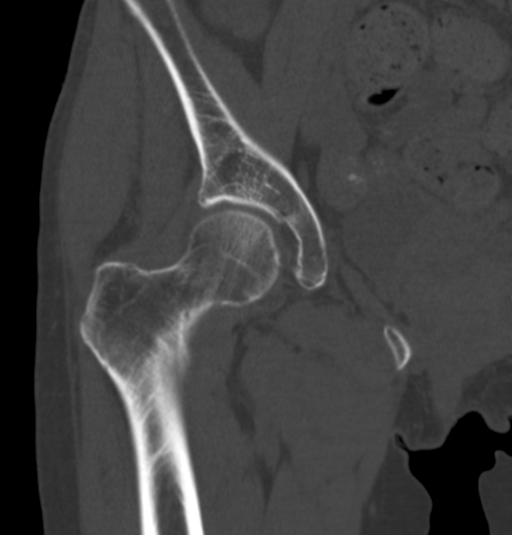
[im 51/92  soft-tissue]
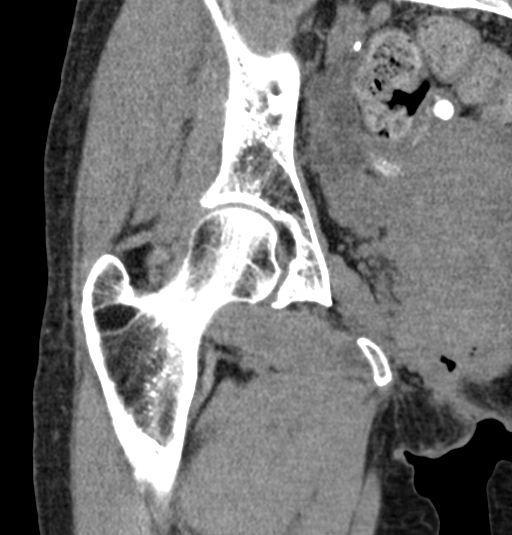

[Series 5: sagittal bone · sagittal · 0.35mm/px · 1 of 102 slices shown, 2 images]
[im 34/102  soft-tissue]
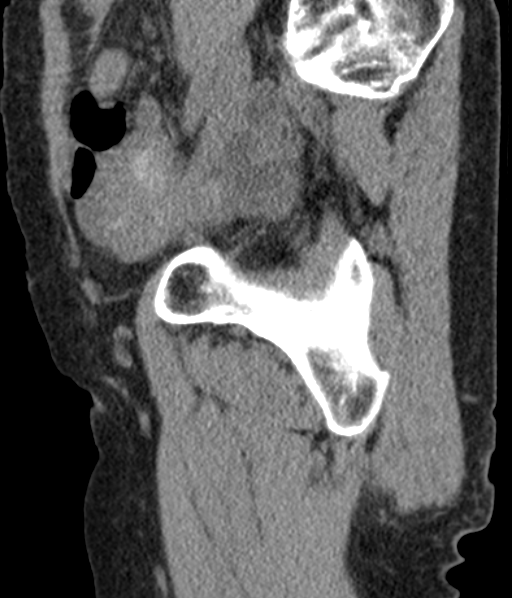
[im 34/102  bone]
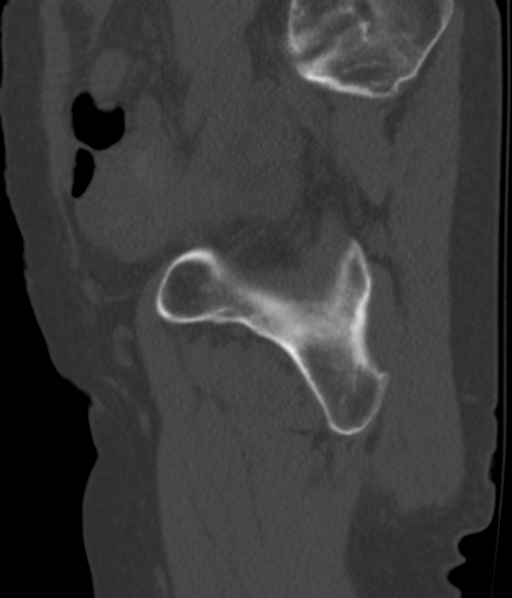

[Series 6: axial bone · axial · 0.34mm/px · z∈[-160,-108]mm · 3 of 103 slices shown]
[im 9/103  bone]
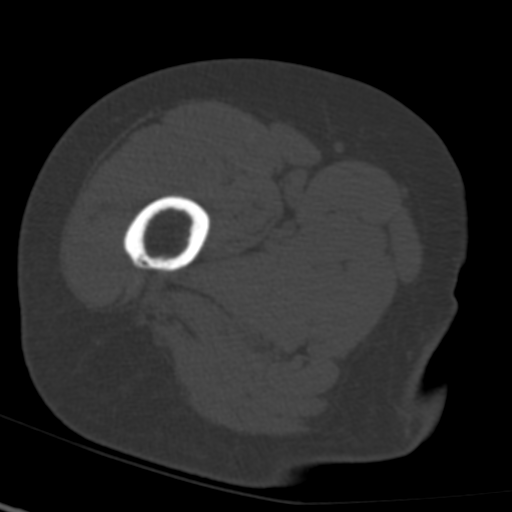
[im 26/103  bone]
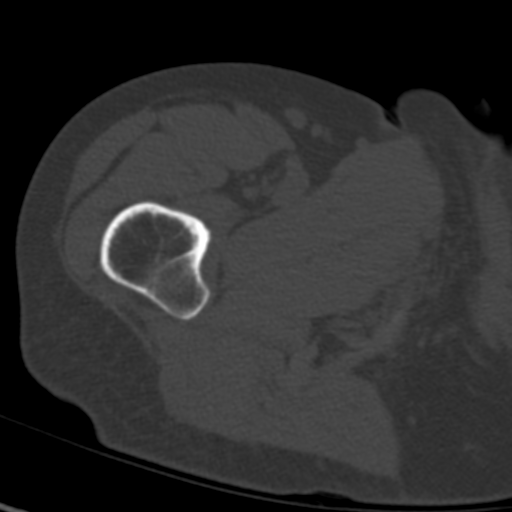
[im 35/103  bone]
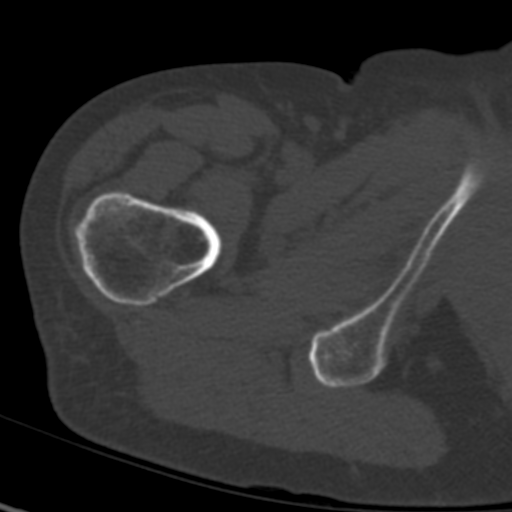

[11 of 46 positions shown; findings below may reference images not displayed]

FINDINGS: Several small bone islands are present in the right iliac bone. Left
eccentric Tarlov cyst in the sacrum. Mild anterior spurring of the
right SI joint. No SI joint widening.

Fluid along the left posterior margin of the uterus could reflect
free fluid or a left ovarian cyst.

Mild cortical irregularity along the super surface of the midportion
of the right inferior pubic ramus, image 45 series 2, suspicious for
a small nondisplaced fracture. I do not visualize a definite
corresponding superior ramus fracture although generally the
superior ramus would also be fractured leading me to suspect a
possible occult superior ramus fracture.

Today's exam does not show a femoral neck fracture.
IMPRESSION: 1. Nondisplaced right inferior pubic ramus fracture in the central
portion. I suspect that there is probably an occult superior ramus
fracture or pubic body fracture although such as not directly seen
on today's exam.
2. Tarlov cyst in the sacrum.
3. Fluid density lesion posterolateral to the uterus could be from a
ovarian cyst or free fluid. Either would be abnormal. Consider
dedicated pelvic sonography for further workup 1 the patient can
tolerate.

## 2017-12-10 ENCOUNTER — Encounter: Payer: Self-pay | Admitting: Internal Medicine

## 2017-12-10 DIAGNOSIS — Z8249 Family history of ischemic heart disease and other diseases of the circulatory system: Secondary | ICD-10-CM | POA: Insufficient documentation

## 2017-12-10 DIAGNOSIS — R0989 Other specified symptoms and signs involving the circulatory and respiratory systems: Secondary | ICD-10-CM | POA: Insufficient documentation

## 2017-12-10 DIAGNOSIS — E782 Mixed hyperlipidemia: Secondary | ICD-10-CM | POA: Insufficient documentation

## 2017-12-10 NOTE — Progress Notes (Signed)
[facility]   [name], M.D.     Uvaldo Bristle. Silverio Lay, P.A.-C Liane Comber, [place]                [address] [place], [place] [address] Telephone [phone] Telefax [phone]  Annual  Screening/Preventative Visit  & Comprehensive Evaluation & Examination     This very nice 72 y.o. DWF  presents for a Screening /Preventative Visit & comprehensive evaluation and management of multiple medical co-morbidities.  Patient has been followed expectantly for labile  HTN, HLD, Prediabetes and Vitamin D Deficiency.      Patient's BP has been controlled at home.  Today's BP is at goal - 112/60. Patient denies any cardiac symptoms as chest pain, palpitations, shortness of breath, dizziness or ankle swelling.     Patient's hyperlipidemia is controlled with diet and medications. Patient denies myalgias or other medication SE's. Last lipids were at goal: Lab Results  Component Value Date   CHOL 179 11/27/2016   HDL 100 11/27/2016   LDLCALC 65 11/27/2016   TRIG 62 11/27/2016   CHOLHDL 1.8 11/27/2016      Patient is monitored for prediabetes and patient denies reactive hypoglycemic symptoms, visual blurring, diabetic polys or paresthesias. Last A1c was Normal & at goal: Lab Results  Component Value Date   HGBA1C 5.2 11/27/2016       Finally, patient has history of Vitamin D Deficiency and last vitamin D was low (goal 70-100): Lab Results  Component Value Date   VD25OH 47 11/27/2016   Current Outpatient Medications on File Prior to Visit  Medication Sig  . Ascorbic Acid (VITAMIN C PO) Take 1 tablet by mouth daily.   . Cholecalciferol (VITAMIN D PO) Take 5,000 Units by mouth daily.   . Multiple Vitamins-Minerals (MULTIVITAMIN PO) Take 1 tablet by mouth daily.   . Omega-3 Fatty Acids (OMEGA 3 PO) Take 500 mg by mouth 2 (two) times daily.  Marland Kitchen OVER THE COUNTER MEDICATION Iodine 7 drops daily  .  OVER THE COUNTER MEDICATION thytrophen  3 per day.  Marland Kitchen OVER THE COUNTER MEDICATION OsteoSheath 1224 mg daily.  Marland Kitchen OVER THE COUNTER MEDICATION   . OVER THE COUNTER MEDICATION   . Probiotic Product (PROBIOTIC DAILY PO) Take 1 tablet by mouth daily.   . Pyridoxine HCl (VITAMIN B-6) 500 MG tablet Take 500 mg by mouth daily.   No current facility-administered medications on file prior to visit.    Allergies  Allergen Reactions  . Sulfa Antibiotics     GI problems  . Iodine Rash    Patient states she had a rash with IV contrast  . Penicillins Rash   History reviewed. No pertinent past medical history. Health Maintenance  Topic Date Due  . Fecal DNA (Cologuard)  11/06/2017  . DEXA SCAN  Completed  . INFLUENZA VACCINE  Discontinued  . MAMMOGRAM  Discontinued  . TETANUS/TDAP  Discontinued  . Hepatitis C Screening  Discontinued  . PNA vac Low Risk Adult  Discontinued   Last Colon - age 63 yo and decline repeat.   Past Surgical History:  Procedure Laterality Date  . OVARIAN CYST REMOVAL  1973   Family History  Problem Relation Age of Onset  . Leukemia Mother   . Goiter Mother   . Stroke Father   . Cancer Father   . Heart attack Father  Social History   Socioeconomic History  . Marital status: Divorced  . Number of children: 1 son 67 yo  Occupational History  . Not on file  Tobacco Use  . Smoking status: Never Smoker  . Smokeless tobacco: Never Used  Substance and Sexual Activity  . Alcohol use: No  . Drug use: No  . Sexual activity: No    ROS Constitutional: Denies fever, chills, weight loss/gain, headaches, insomnia,  night sweats or change in appetite. Does c/o fatigue. Eyes: Denies redness, blurred vision, diplopia, discharge, itchy or watery eyes.  ENT: Denies discharge, congestion, post nasal drip, epistaxis, sore throat, earache, hearing loss, dental pain, Tinnitus, Vertigo, Sinus pain or snoring.  Cardio: Denies chest pain, palpitations, irregular heartbeat,  syncope, dyspnea, diaphoresis, orthopnea, PND, claudication or edema Respiratory: denies cough, dyspnea, DOE, pleurisy, hoarseness, laryngitis or wheezing.  Gastrointestinal: Denies dysphagia, heartburn, reflux, water brash, pain, cramps, nausea, vomiting, bloating, diarrhea, constipation, hematemesis, melena, hematochezia, jaundice or hemorrhoids Genitourinary: Denies dysuria, frequency, urgency, nocturia, hesitancy, discharge, hematuria or flank pain Musculoskeletal: Denies arthralgia, myalgia, stiffness, Jt. Swelling, pain, limp or strain/sprain. Denies Falls. Skin: Denies puritis, rash, hives, warts, acne, eczema or change in skin lesion Neuro: No weakness, tremor, incoordination, spasms, paresthesia or pain Psychiatric: Denies confusion, memory loss or sensory loss. Denies Depression. Endocrine: Denies change in weight, skin, hair change, nocturia, and paresthesia, diabetic polys, visual blurring or hyper / hypo glycemic episodes.  Heme/Lymph: No excessive bleeding, bruising or enlarged lymph nodes.  Physical Exam  BP 112/60   Pulse 63   Temp 98.5 F    Ht 5' 2.5"  Wt 103 lb  SpO2 98%   BMI 18.54  General Appearance: Well nourished and well groomed and in no apparent distress.  Eyes: PERRLA, EOMs, conjunctiva no swelling or erythema, normal fundi and vessels. Sinuses: No frontal/maxillary tenderness ENT/Mouth: EACs patent / TMs  nl. Nares clear without erythema, swelling, mucoid exudates. Oral hygiene is good. No erythema, swelling, or exudate. Tongue normal, non-obstructing. Tonsils not swollen or erythematous. Hearing normal.  Neck: Supple, thyroid not palpable. No bruits, nodes or JVD. Respiratory: Respiratory effort normal.  BS equal and clear bilateral without rales, rhonci, wheezing or stridor. Cardio: Heart sounds are normal with regular rate and rhythm and no murmurs, rubs or gallops. Peripheral pulses are normal and equal bilaterally without edema. No aortic or femoral  bruits. Chest: symmetric with normal excursions and percussion.  Abdomen: Soft, with Nl bowel sounds. Nontender, no guarding, rebound, hernias, masses, or organomegaly.  Lymphatics: Non tender without lymphadenopathy.  Genitourinary: No hernias.Testes nl. DRE - prostate nl for age - smooth & firm w/o nodules. Musculoskeletal: Full ROM all peripheral extremities, joint stability, 5/5 strength, and normal gait. Skin: Warm and dry without rashes, lesions, cyanosis, clubbing or  ecchymosis.  Neuro: Cranial nerves intact, reflexes equal bilaterally. Normal muscle tone, no cerebellar symptoms. Sensation intact.  Pysch: Alert and oriented X 3 with normal affect, insight and judgment appropriate.   Assessment and Plan  1. Annual Preventative/Screening Exam   2. Labile hypertension  - EKG 12-Lead - Urinalysis, Routine w reflex microscopic - Microalbumin / creatinine urine ratio - CBC with Differential/Platelet - COMPLETE METABOLIC PANEL WITH GFR - Magnesium - TSH  3. Hyperlipidemia, mixed  - EKG 12-Lead - Lipid panel - TSH  4. Screening for diabetes mellitus  - Hemoglobin A1c - Insulin, random  5. Vitamin D deficiency  - VITAMIN D 25 Hydroxy  6. Screening for ischemic heart disease  - EKG 12-Lead -  Lipid panel  7. Screening for colorectal cancer  - POC Hemoccult Bld/Stl  8. FHx: heart disease  - EKG 12-Lead  9. Medication management  - Urinalysis, Routine w reflex microscopic - Microalbumin / creatinine urine ratio - CBC with Differential/Platelet - COMPLETE METABOLIC PANEL WITH GFR - Magnesium - Lipid panel - TSH - Hemoglobin A1c - Insulin, random - VITAMIN D 25 Hydroxy           Patient was counseled in prudent diet, weight control to achieve/maintain BMI less than 25, BP monitoring, regular exercise and medications as discussed.  Discussed med effects and SE's. Routine screening labs and tests as requested with regular follow-up as recommended. Over 40  minutes of exam, counseling, chart review and high complex critical decision making was performed

## 2017-12-10 NOTE — Patient Instructions (Addendum)
We Do NOT Approve of  [facility] [place] Soliciting Our Patients  To Do Home Visits & We Do NOT Approve of [employer] > > > > > > > > > > > > > > > > > > > > > > > > > > > > > > > > > > > > > > >  Preventive Care for Adults  A healthy lifestyle and preventive care can promote health and wellness. Preventive health guidelines for women include the following key practices.  A routine yearly physical is a good way to check with your health care provider about your health and preventive screening. It is a chance to share any concerns and updates on your health and to receive a thorough exam.  Visit your dentist for a routine exam and preventive care every 6 months. Brush your teeth twice a day and floss once a day. Good oral hygiene prevents tooth decay and gum disease.  The frequency of eye exams is based on your age, health, family medical history, use of contact lenses, and other factors. Follow your health care provider's recommendations for frequency of eye exams.  Eat a healthy diet. Foods like vegetables, fruits, whole grains, low-fat dairy products, and lean protein foods contain the nutrients you need without too many calories. Decrease your intake of foods high in solid fats, added sugars, and salt. Eat the right amount of calories for you. Get information about a proper diet from your health care provider, if necessary.  Regular physical exercise is one of the most important things you can do for your health. Most adults should get at least 150 minutes of moderate-intensity exercise (any activity that increases your heart rate and causes you to sweat) each week. In addition, most adults need muscle-strengthening exercises on 2 or more days a week.  Maintain a healthy weight. The body mass index (BMI) is a screening tool to identify possible weight problems. It provides an estimate of body fat based on height and weight. Your health care provider can find your BMI  and can help you achieve or maintain a healthy weight. For adults 20 years and older:  A BMI below 18.5 is considered underweight.  A BMI of 18.5 to 24.9 is normal.  A BMI of 25 to 29.9 is considered overweight.  A BMI of 30 and above is considered obese.  Maintain normal blood lipids and cholesterol levels by exercising and minimizing your intake of saturated fat. Eat a balanced diet with plenty of fruit and vegetables. If your lipid or cholesterol levels are high, you are over 50, or you are at high risk for heart disease, you may need your cholesterol levels checked more frequently. Ongoing high lipid and cholesterol levels should be treated with medicines if diet and exercise are not working.  If you smoke, find out from your health care provider how to quit. If you do not use tobacco, do not start.  Lung cancer screening is recommended for adults aged 55-80 years who are at high risk for developing lung cancer because of a history of smoking. A yearly low-dose CT scan of the lungs is recommended for people who have at least a 30-pack-year history of smoking and are a current smoker or have quit within the past 15 years. A pack year of smoking is smoking an average of 1 pack of cigarettes a day for 1 year (for example: 1 pack a day for 30 years or 2 packs a day   for 15 years). Yearly screening should continue until the smoker has stopped smoking for at least 15 years. Yearly screening should be stopped for people who develop a health problem that would prevent them from having lung cancer treatment.  Avoid use of street drugs. Do not share needles with anyone. Ask for help if you need support or instructions about stopping the use of drugs.  High blood pressure causes heart disease and increases the risk of stroke.  Ongoing high blood pressure should be treated with medicines if weight loss and exercise do not work.  If you are 29-34 years old, ask your health care provider if you should take  aspirin to prevent strokes.  Diabetes screening involves taking a blood sample to check your fasting blood sugar level. This should be done once every 3 years, after age 27, if you are within normal weight and without risk factors for diabetes. Testing should be considered at a younger age or be carried out more frequently if you are overweight and have at least 1 risk factor for diabetes.  Breast cancer screening is essential preventive care for women. You should practice "breast self-awareness." This means understanding the normal appearance and feel of your breasts and may include breast self-examination. Any changes detected, no matter how small, should be reported to a health care provider. Women in their 64s and 30s should have a clinical breast exam (CBE) by a health care provider as part of a regular health exam every 1 to 3 years. After age 69, women should have a CBE every year. Starting at age 6, women should consider having a mammogram (breast X-ray test) every year. Women who have a family history of breast cancer should talk to their health care provider about genetic screening. Women at a high risk of breast cancer should talk to their health care providers about having an MRI and a mammogram every year.  Breast cancer gene (BRCA)-related cancer risk assessment is recommended for women who have family members with BRCA-related cancers. BRCA-related cancers include breast, ovarian, tubal, and peritoneal cancers. Having family members with these cancers may be associated with an increased risk for harmful changes (mutations) in the breast cancer genes BRCA1 and BRCA2. Results of the assessment will determine the need for genetic counseling and BRCA1 and BRCA2 testing.  Routine pelvic exams to screen for cancer are no longer recommended for nonpregnant women who are considered low risk for cancer of the pelvic organs (ovaries, uterus, and vagina) and who do not have symptoms. Ask your health  care provider if a screening pelvic exam is right for you.  If you have had past treatment for cervical cancer or a condition that could lead to cancer, you need Pap tests and screening for cancer for at least 20 years after your treatment. If Pap tests have been discontinued, your risk factors (such as having a new sexual partner) need to be reassessed to determine if screening should be resumed. Some women have medical problems that increase the chance of getting cervical cancer. In these cases, your health care provider may recommend more frequent screening and Pap tests.    Colorectal cancer can be detected and often prevented. Most routine colorectal cancer screening begins at the age of 21 years and continues through age 8 years. However, your health care provider may recommend screening at an earlier age if you have risk factors for colon cancer. On a yearly basis, your health care provider may provide home test kits to check  for hidden blood in the stool. Use of a small camera at the end of a tube, to directly examine the colon (sigmoidoscopy or colonoscopy), can detect the earliest forms of colorectal cancer. Talk to your health care provider about this at age 50, when routine screening begins.  Direct exam of the colon should be repeated every 5-10 years through age 75 years, unless early forms of pre-cancerous polyps or small growths are found.  Osteoporosis is a disease in which the bones lose minerals and strength with aging. This can result in serious bone fractures or breaks. The risk of osteoporosis can be identified using a bone density scan. Women ages 65 years and over and women at risk for fractures or osteoporosis should discuss screening with their health care providers. Ask your health care provider whether you should take a calcium supplement or vitamin D to reduce the rate of osteoporosis.  Menopause can be associated with physical symptoms and risks. Hormone replacement therapy  is available to decrease symptoms and risks. You should talk to your health care provider about whether hormone replacement therapy is right for you.  Use sunscreen. Apply sunscreen liberally and repeatedly throughout the day. You should seek shade when your shadow is shorter than you. Protect yourself by wearing long sleeves, pants, a wide-brimmed hat, and sunglasses year round, whenever you are outdoors.  Once a month, do a whole body skin exam, using a mirror to look at the skin on your back. Tell your health care provider of new moles, moles that have irregular borders, moles that are larger than a pencil eraser, or moles that have changed in shape or color.  Stay current with required vaccines (immunizations).  Influenza vaccine. All adults should be immunized every year.  Tetanus, diphtheria, and acellular pertussis (Td, Tdap) vaccine. Pregnant women should receive 1 dose of Tdap vaccine during each pregnancy. The dose should be obtained regardless of the length of time since the last dose. Immunization is preferred during the 27th-36th week of gestation. An adult who has not previously received Tdap or who does not know her vaccine status should receive 1 dose of Tdap. This initial dose should be followed by tetanus and diphtheria toxoids (Td) booster doses every 10 years. Adults with an unknown or incomplete history of completing a 3-dose immunization series with Td-containing vaccines should begin or complete a primary immunization series including a Tdap dose. Adults should receive a Td booster every 10 years.    Zoster vaccine. One dose is recommended for adults aged 60 years or older unless certain conditions are present.    Pneumococcal 13-valent conjugate (PCV13) vaccine. When indicated, a person who is uncertain of her immunization history and has no record of immunization should receive the PCV13 vaccine. An adult aged 19 years or older who has certain medical conditions and has not  been previously immunized should receive 1 dose of PCV13 vaccine. This PCV13 should be followed with a dose of pneumococcal polysaccharide (PPSV23) vaccine. The PPSV23 vaccine dose should be obtained at least 1 or more year(s) after the dose of PCV13 vaccine. An adult aged 19 years or older who has certain medical conditions and previously received 1 or more doses of PPSV23 vaccine should receive 1 dose of PCV13. The PCV13 vaccine dose should be obtained 1 or more years after the last PPSV23 vaccine dose.    Pneumococcal polysaccharide (PPSV23) vaccine. When PCV13 is also indicated, PCV13 should be obtained first. All adults aged 65 years and older should   be immunized. An adult younger than age 65 years who has certain medical conditions should be immunized. Any person who resides in a nursing home or long-term care facility should be immunized. An adult smoker should be immunized. People with an immunocompromised condition and certain other conditions should receive both PCV13 and PPSV23 vaccines. People with human immunodeficiency virus (HIV) infection should be immunized as soon as possible after diagnosis. Immunization during chemotherapy or radiation therapy should be avoided. Routine use of PPSV23 vaccine is not recommended for American Indians, [place] Natives, or people younger than 65 years unless there are medical conditions that require PPSV23 vaccine. When indicated, people who have unknown immunization and have no record of immunization should receive PPSV23 vaccine. One-time revaccination 5 years after the first dose of PPSV23 is recommended for people aged 19-64 years who have chronic kidney failure, nephrotic syndrome, asplenia, or immunocompromised conditions. People who received 1-2 doses of PPSV23 before age 65 years should receive another dose of PPSV23 vaccine at age 65 years or later if at least 5 years have passed since the previous dose. Doses of PPSV23 are not needed for people immunized  with PPSV23 at or after age 65 years.   Preventive Services / Frequency  Ages 65 years and over  Blood pressure check.  Lipid and cholesterol check.  Lung cancer screening. / Every year if you are aged 55-80 years and have a 30-pack-year history of smoking and currently smoke or have quit within the past 15 years. Yearly screening is stopped once you have quit smoking for at least 15 years or develop a health problem that would prevent you from having lung cancer treatment.  Clinical breast exam.** / Every year after age 40 years.   BRCA-related cancer risk assessment.** / For women who have family members with a BRCA-related cancer (breast, ovarian, tubal, or peritoneal cancers).  Mammogram.** / Every year beginning at age 40 years and continuing for as long as you are in good health. Consult with your health care provider.  Pap test.** / Every 3 years starting at age 30 years through age 65 or 70 years with 3 consecutive normal Pap tests. Testing can be stopped between 65 and 70 years with 3 consecutive normal Pap tests and no abnormal Pap or HPV tests in the past 10 years.  Fecal occult blood test (FOBT) of stool. / Every year beginning at age 50 years and continuing until age 75 years. You may not need to do this test if you get a colonoscopy every 10 years.  Flexible sigmoidoscopy or colonoscopy.** / Every 5 years for a flexible sigmoidoscopy or every 10 years for a colonoscopy beginning at age 50 years and continuing until age 75 years.  Hepatitis C blood test.** / For all people born from 1945 through 1965 and any individual with known risks for hepatitis C.  Osteoporosis screening.** / A one-time screening for women ages 65 years and over and women at risk for fractures or osteoporosis.  Skin self-exam. / Monthly.  Influenza vaccine. / Every year.  Tetanus, diphtheria, and acellular pertussis (Tdap/Td) vaccine.** / 1 dose of Td every 10 years.  Zoster vaccine.** / 1 dose  for adults aged 60 years or older.  Pneumococcal 13-valent conjugate (PCV13) vaccine.** / Consult your health care provider.  Pneumococcal polysaccharide (PPSV23) vaccine.** / 1 dose for all adults aged 65 years and older. Screening for abdominal aortic aneurysm (AAA)  by ultrasound is recommended for people who have history of high blood pressure   or who are current or former smokers. ++++++++++++++++++++ Recommend Adult Low Dose Aspirin or  coated  Aspirin 81 mg daily  To reduce risk of Colon Cancer 20 %,  Skin Cancer 26 % ,  Melanoma 46%  and  Pancreatic cancer 60% ++++++++++++++++++++ Vitamin D goal  is between 70-100.  Please make sure that you are taking your Vitamin D as directed.  It is very important as a natural anti-inflammatory  helping hair, skin, and nails, as well as reducing stroke and heart attack risk.  It helps your bones and helps with mood. It also decreases numerous cancer risks so please take it as directed.  Low Vit D is associated with a 200-300% higher risk for CANCER  and 200-300% higher risk for HEART   ATTACK  &  STROKE.   ...................................... It is also associated with higher death rate at younger ages,  autoimmune diseases like Rheumatoid arthritis, Lupus, Multiple Sclerosis.    Also many other serious conditions, like depression, Alzheimer's Dementia, infertility, muscle aches, fatigue, fibromyalgia - just to name a few. ++++++++++++++++++ Recommend the book "The END of DIETING" by Dr Joel Fuhrman  & the book "The END of DIABETES " by Dr Joel Fuhrman At [employer] - get book & Audio CD's    Being diabetic has a  300% increased risk for heart attack, stroke, cancer, and alzheimer- type vascular dementia. It is very important that you work harder with diet by avoiding all foods that are white. Avoid white rice (brown & wild rice is OK), white potatoes (sweetpotatoes in moderation is OK), White bread or wheat bread or anything made out of  white flour like bagels, donuts, rolls, buns, biscuits, cakes, pastries, cookies, pizza crust, and pasta (made from white flour & egg whites) - vegetarian pasta or spinach or wheat pasta is OK. Multigrain breads like Arnold's or Pepperidge [employer], or multigrain sandwich thins or flatbreads.  Diet, exercise and weight loss can reverse and cure diabetes in the early stages.  Diet, exercise and weight loss is very important in the control and prevention of complications of diabetes which affects every system in your body, ie. Brain - dementia/stroke, eyes - glaucoma/blindness, heart - heart attack/heart failure, kidneys - dialysis, stomach - gastric paralysis, intestines - malabsorption, nerves - severe painful neuritis, circulation - gangrene & loss of a leg(s), and finally cancer and Alzheimers.    I recommend avoid fried & greasy foods,  sweets/candy, white rice (brown or wild rice or Quinoa is OK), white potatoes (sweet potatoes are OK) - anything made from white flour - bagels, doughnuts, rolls, buns, biscuits,white and wheat breads, pizza crust and traditional pasta made of white flour & egg white(vegetarian pasta or spinach or wheat pasta is OK).  Multi-grain bread is OK - like multi-grain flat bread or sandwich thins. Avoid alcohol in excess. Exercise is also important.    Eat all the vegetables you want - avoid meat, especially red meat and dairy - especially cheese.  Cheese is the most concentrated form of trans-fats which is the worst thing to clog up our arteries. Veggie cheese is OK which can be found in the fresh produce section at [employer] or [employer] or [employer]  +++++++++++++++++++ DASH Eating Plan  DASH stands for "Dietary Approaches to Stop Hypertension."   The DASH eating plan is a healthy eating plan that has been shown to reduce high blood pressure (hypertension). Additional health benefits may include reducing the risk of type 2 diabetes mellitus, heart disease,   and stroke. The  DASH eating plan may also help with weight loss. WHAT DO I NEED TO KNOW ABOUT THE DASH EATING PLAN? For the DASH eating plan, you will follow these general guidelines:  Choose foods with a percent daily value for sodium of less than 5% (as listed on the food label).  Use salt-free seasonings or herbs instead of table salt or sea salt.  Check with your health care provider or pharmacist before using salt substitutes.  Eat lower-sodium products, often labeled as "lower sodium" or "no salt added."  Eat fresh foods.  Eat more vegetables, fruits, and low-fat dairy products.  Choose whole grains. Look for the word "whole" as the first word in the ingredient list.  Choose fish   Limit sweets, desserts, sugars, and sugary drinks.  Choose heart-healthy fats.  Eat veggie cheese   Eat more home-cooked food and less restaurant, buffet, and fast food.  Limit fried foods.  Cook foods using methods other than frying.  Limit canned vegetables. If you do use them, rinse them well to decrease the sodium.  When eating at a restaurant, ask that your food be prepared with less salt, or no salt if possible.                      WHAT FOODS CAN I EAT? Read Dr Fara Olden Fuhrman's books on The End of Dieting & The End of Diabetes  Grains Whole grain or whole wheat bread. Brown rice. Whole grain or whole wheat pasta. Quinoa, bulgur, and whole grain cereals. Low-sodium cereals. Corn or whole wheat flour tortillas. Whole grain cornbread. Whole grain crackers. Low-sodium crackers.  Vegetables Fresh or frozen vegetables (raw, steamed, roasted, or grilled). Low-sodium or reduced-sodium tomato and vegetable juices. Low-sodium or reduced-sodium tomato sauce and paste. Low-sodium or reduced-sodium canned vegetables.   Fruits All fresh, canned (in natural juice), or frozen fruits.  Protein Products  All fish and seafood.  Dried beans, peas, or lentils. Unsalted nuts and seeds. Unsalted canned  beans.  Dairy Low-fat dairy products, such as skim or 1% milk, 2% or reduced-fat cheeses, low-fat ricotta or cottage cheese, or plain low-fat yogurt. Low-sodium or reduced-sodium cheeses.  Fats and Oils Tub margarines without trans fats. Light or reduced-fat mayonnaise and salad dressings (reduced sodium). Avocado. Safflower, olive, or canola oils. Natural peanut or almond butter.  Other Unsalted popcorn and pretzels. The items listed above may not be a complete list of recommended foods or beverages. Contact your dietitian for more options.  +++++++++++++++  WHAT FOODS ARE NOT RECOMMENDED? Grains/ White flour or wheat flour White bread. White pasta. White rice. Refined cornbread. Bagels and croissants. Crackers that contain trans fat.  Vegetables  Creamed or fried vegetables. Vegetables in a . Regular canned vegetables. Regular canned tomato sauce and paste. Regular tomato and vegetable juices.  Fruits Dried fruits. Canned fruit in light or heavy syrup. Fruit juice.  Meat and Other Protein Products Meat in general - RED meat & White meat.  Fatty cuts of meat. Ribs, chicken wings, all processed meats as bacon, sausage, bologna, salami, fatback, hot dogs, bratwurst and packaged luncheon meats.  Dairy Whole or 2% milk, cream, half-and-half, and cream cheese. Whole-fat or sweetened yogurt. Full-fat cheeses or blue cheese. Non-dairy creamers and whipped toppings. Processed cheese, cheese spreads, or cheese curds.  Condiments Onion and garlic salt, seasoned salt, table salt, and sea salt. Canned and packaged gravies. Worcestershire sauce. Tartar sauce. Barbecue sauce. Teriyaki sauce. Soy sauce, including reduced  sodium. Steak sauce. Fish sauce. Oyster sauce. Cocktail sauce. Horseradish. Ketchup and mustard. Meat flavorings and tenderizers. Bouillon cubes. Hot sauce. Tabasco sauce. Marinades. Taco seasonings. Relishes.  Fats and Oils Butter, stick margarine, lard, shortening and bacon  fat. Coconut, palm kernel, or palm oils. Regular salad dressings.  Pickles and olives. Salted popcorn and pretzels.  The items listed above may not be a complete list of foods and beverages to avoid.    We Do NOT Approve of  [facility] [place] Soliciting Our Patients  To Do Home Visits & We Do NOT Approve of [employer] > > > > > > > > > > > > > > > > > > > > > > > > > > > > > > > > > > > > > > >  Preventive Care for Adults  A healthy lifestyle and preventive care can promote health and wellness. Preventive health guidelines for women include the following key practices.  A routine yearly physical is a good way to check with your health care provider about your health and preventive screening. It is a chance to share any concerns and updates on your health and to receive a thorough exam.  Visit your dentist for a routine exam and preventive care every 6 months. Brush your teeth twice a day and floss once a day. Good oral hygiene prevents tooth decay and gum disease.  The frequency of eye exams is based on your age, health, family medical history, use of contact lenses, and other factors. Follow your health care provider's recommendations for frequency of eye exams.  Eat a healthy diet. Foods like vegetables, fruits, whole grains, low-fat dairy products, and lean protein foods contain the nutrients you need without too many calories. Decrease your intake of foods high in solid fats, added sugars, and salt. Eat the right amount of calories for you. Get information about a proper diet from your health care provider, if necessary.  Regular physical exercise is one of the most important things you can do for your health. Most adults should get at least 150 minutes of moderate-intensity exercise (any activity that increases your heart rate and causes you to sweat) each week. In addition, most adults need muscle-strengthening exercises on 2 or more days a week.  Maintain a  healthy weight. The body mass index (BMI) is a screening tool to identify possible weight problems. It provides an estimate of body fat based on height and weight. Your health care provider can find your BMI and can help you achieve or maintain a healthy weight. For adults 20 years and older:  A BMI below 18.5 is considered underweight.  A BMI of 18.5 to 24.9 is normal.  A BMI of 25 to 29.9 is considered overweight.  A BMI of 30 and above is considered obese.  Maintain normal blood lipids and cholesterol levels by exercising and minimizing your intake of saturated fat. Eat a balanced diet with plenty of fruit and vegetables. If your lipid or cholesterol levels are high, you are over 50, or you are at high risk for heart disease, you may need your cholesterol levels checked more frequently. Ongoing high lipid and cholesterol levels should be treated with medicines if diet and exercise are not working.  If you smoke, find out from your health care provider how to quit. If you do not use tobacco, do not start.  Lung cancer screening is recommended for adults aged 55-80 years who are at high risk   for developing lung cancer because of a history of smoking. A yearly low-dose CT scan of the lungs is recommended for people who have at least a 30-pack-year history of smoking and are a current smoker or have quit within the past 15 years. A pack year of smoking is smoking an average of 1 pack of cigarettes a day for 1 year (for example: 1 pack a day for 30 years or 2 packs a day for 15 years). Yearly screening should continue until the smoker has stopped smoking for at least 15 years. Yearly screening should be stopped for people who develop a health problem that would prevent them from having lung cancer treatment.  Avoid use of street drugs. Do not share needles with anyone. Ask for help if you need support or instructions about stopping the use of drugs.  High blood pressure causes heart disease and  increases the risk of stroke.  Ongoing high blood pressure should be treated with medicines if weight loss and exercise do not work.  If you are 55-79 years old, ask your health care provider if you should take aspirin to prevent strokes.  Diabetes screening involves taking a blood sample to check your fasting blood sugar level. This should be done once every 3 years, after age 45, if you are within normal weight and without risk factors for diabetes. Testing should be considered at a younger age or be carried out more frequently if you are overweight and have at least 1 risk factor for diabetes.  Breast cancer screening is essential preventive care for women. You should practice "breast self-awareness." This means understanding the normal appearance and feel of your breasts and may include breast self-examination. Any changes detected, no matter how small, should be reported to a health care provider. Women in their 20s and 30s should have a clinical breast exam (CBE) by a health care provider as part of a regular health exam every 1 to 3 years. After age 40, women should have a CBE every year. Starting at age 40, women should consider having a mammogram (breast X-ray test) every year. Women who have a family history of breast cancer should talk to their health care provider about genetic screening. Women at a high risk of breast cancer should talk to their health care providers about having an MRI and a mammogram every year.  Breast cancer gene (BRCA)-related cancer risk assessment is recommended for women who have family members with BRCA-related cancers. BRCA-related cancers include breast, ovarian, tubal, and peritoneal cancers. Having family members with these cancers may be associated with an increased risk for harmful changes (mutations) in the breast cancer genes BRCA1 and BRCA2. Results of the assessment will determine the need for genetic counseling and BRCA1 and BRCA2 testing.  Routine pelvic  exams to screen for cancer are no longer recommended for nonpregnant women who are considered low risk for cancer of the pelvic organs (ovaries, uterus, and vagina) and who do not have symptoms. Ask your health care provider if a screening pelvic exam is right for you.  If you have had past treatment for cervical cancer or a condition that could lead to cancer, you need Pap tests and screening for cancer for at least 20 years after your treatment. If Pap tests have been discontinued, your risk factors (such as having a new sexual partner) need to be reassessed to determine if screening should be resumed. Some women have medical problems that increase the chance of getting cervical cancer. In these   cases, your health care provider may recommend more frequent screening and Pap tests.    Colorectal cancer can be detected and often prevented. Most routine colorectal cancer screening begins at the age of 50 years and continues through age 75 years. However, your health care provider may recommend screening at an earlier age if you have risk factors for colon cancer. On a yearly basis, your health care provider may provide home test kits to check for hidden blood in the stool. Use of a small camera at the end of a tube, to directly examine the colon (sigmoidoscopy or colonoscopy), can detect the earliest forms of colorectal cancer. Talk to your health care provider about this at age 50, when routine screening begins.  Direct exam of the colon should be repeated every 5-10 years through age 75 years, unless early forms of pre-cancerous polyps or small growths are found.  Osteoporosis is a disease in which the bones lose minerals and strength with aging. This can result in serious bone fractures or breaks. The risk of osteoporosis can be identified using a bone density scan. Women ages 65 years and over and women at risk for fractures or osteoporosis should discuss screening with their health care providers. Ask  your health care provider whether you should take a calcium supplement or vitamin D to reduce the rate of osteoporosis.  Menopause can be associated with physical symptoms and risks. Hormone replacement therapy is available to decrease symptoms and risks. You should talk to your health care provider about whether hormone replacement therapy is right for you.  Use sunscreen. Apply sunscreen liberally and repeatedly throughout the day. You should seek shade when your shadow is shorter than you. Protect yourself by wearing long sleeves, pants, a wide-brimmed hat, and sunglasses year round, whenever you are outdoors.  Once a month, do a whole body skin exam, using a mirror to look at the skin on your back. Tell your health care provider of new moles, moles that have irregular borders, moles that are larger than a pencil eraser, or moles that have changed in shape or color.  Stay current with required vaccines (immunizations).  Influenza vaccine. All adults should be immunized every year.  Tetanus, diphtheria, and acellular pertussis (Td, Tdap) vaccine. Pregnant women should receive 1 dose of Tdap vaccine during each pregnancy. The dose should be obtained regardless of the length of time since the last dose. Immunization is preferred during the 27th-36th week of gestation. An adult who has not previously received Tdap or who does not know her vaccine status should receive 1 dose of Tdap. This initial dose should be followed by tetanus and diphtheria toxoids (Td) booster doses every 10 years. Adults with an unknown or incomplete history of completing a 3-dose immunization series with Td-containing vaccines should begin or complete a primary immunization series including a Tdap dose. Adults should receive a Td booster every 10 years.    Zoster vaccine. One dose is recommended for adults aged 60 years or older unless certain conditions are present.    Pneumococcal 13-valent conjugate (PCV13) vaccine.  When indicated, a person who is uncertain of her immunization history and has no record of immunization should receive the PCV13 vaccine. An adult aged 19 years or older who has certain medical conditions and has not been previously immunized should receive 1 dose of PCV13 vaccine. This PCV13 should be followed with a dose of pneumococcal polysaccharide (PPSV23) vaccine. The PPSV23 vaccine dose should be obtained at least 1 or more   year(s) after the dose of PCV13 vaccine. An adult aged 19 years or older who has certain medical conditions and previously received 1 or more doses of PPSV23 vaccine should receive 1 dose of PCV13. The PCV13 vaccine dose should be obtained 1 or more years after the last PPSV23 vaccine dose.    Pneumococcal polysaccharide (PPSV23) vaccine. When PCV13 is also indicated, PCV13 should be obtained first. All adults aged 65 years and older should be immunized. An adult younger than age 65 years who has certain medical conditions should be immunized. Any person who resides in a nursing home or long-term care facility should be immunized. An adult smoker should be immunized. People with an immunocompromised condition and certain other conditions should receive both PCV13 and PPSV23 vaccines. People with human immunodeficiency virus (HIV) infection should be immunized as soon as possible after diagnosis. Immunization during chemotherapy or radiation therapy should be avoided. Routine use of PPSV23 vaccine is not recommended for American Indians, [place] Natives, or people younger than 65 years unless there are medical conditions that require PPSV23 vaccine. When indicated, people who have unknown immunization and have no record of immunization should receive PPSV23 vaccine. One-time revaccination 5 years after the first dose of PPSV23 is recommended for people aged 19-64 years who have chronic kidney failure, nephrotic syndrome, asplenia, or immunocompromised conditions. People who received 1-2  doses of PPSV23 before age 65 years should receive another dose of PPSV23 vaccine at age 65 years or later if at least 5 years have passed since the previous dose. Doses of PPSV23 are not needed for people immunized with PPSV23 at or after age 65 years.   Preventive Services / Frequency  Ages 65 years and over  Blood pressure check.  Lipid and cholesterol check.  Lung cancer screening. / Every year if you are aged 55-80 years and have a 30-pack-year history of smoking and currently smoke or have quit within the past 15 years. Yearly screening is stopped once you have quit smoking for at least 15 years or develop a health problem that would prevent you from having lung cancer treatment.  Clinical breast exam.** / Every year after age 40 years.   BRCA-related cancer risk assessment.** / For women who have family members with a BRCA-related cancer (breast, ovarian, tubal, or peritoneal cancers).  Mammogram.** / Every year beginning at age 40 years and continuing for as long as you are in good health. Consult with your health care provider.  Pap test.** / Every 3 years starting at age 30 years through age 65 or 70 years with 3 consecutive normal Pap tests. Testing can be stopped between 65 and 70 years with 3 consecutive normal Pap tests and no abnormal Pap or HPV tests in the past 10 years.  Fecal occult blood test (FOBT) of stool. / Every year beginning at age 50 years and continuing until age 75 years. You may not need to do this test if you get a colonoscopy every 10 years.  Flexible sigmoidoscopy or colonoscopy.** / Every 5 years for a flexible sigmoidoscopy or every 10 years for a colonoscopy beginning at age 50 years and continuing until age 75 years.  Hepatitis C blood test.** / For all people born from 1945 through 1965 and any individual with known risks for hepatitis C.  Osteoporosis screening.** / A one-time screening for women ages 65 years and over and women at risk for fractures  or osteoporosis.  Skin self-exam. / Monthly.  Influenza vaccine. / Every year.    Tetanus, diphtheria, and acellular pertussis (Tdap/Td) vaccine.** / 1 dose of Td every 10 years.  Zoster vaccine.** / 1 dose for adults aged 37 years or older.  Pneumococcal 13-valent conjugate (PCV13) vaccine.** / Consult your health care provider.  Pneumococcal polysaccharide (PPSV23) vaccine.** / 1 dose for all adults aged 81 years and older. Screening for abdominal aortic aneurysm (AAA)  by ultrasound is recommended for people who have history of high blood pressure or who are current or former smokers. ++++++++++++++++++++ Recommend Adult Low Dose Aspirin or  coated  Aspirin 81 mg daily  To reduce risk of Colon Cancer 20 %,  Skin Cancer 26 % ,  Melanoma 46%  and  Pancreatic cancer 60% ++++++++++++++++++++ Vitamin D goal  is between 70-100.  Please make sure that you are taking your Vitamin D as directed.  It is very important as a natural anti-inflammatory  helping hair, skin, and nails, as well as reducing stroke and heart attack risk.  It helps your bones and helps with mood. It also decreases numerous cancer risks so please take it as directed.  Low Vit D is associated with a 200-300% higher risk for CANCER  and 200-300% higher risk for HEART   ATTACK  &  STROKE.   .....................................Marland Kitchen It is also associated with higher death rate at younger ages,  autoimmune diseases like Rheumatoid arthritis, Lupus, Multiple Sclerosis.    Also many other serious conditions, like depression, Alzheimer's Dementia, infertility, muscle aches, fatigue, fibromyalgia - just to name a few. ++++++++++++++++++ Recommend the book "The END of DIETING" by Dr Excell Seltzer  & the book "The END of DIABETES " by Dr Excell Seltzer At [email or web address] - get book & Audio CD's    Being diabetic has a  300% increased risk for heart attack, stroke, cancer, and alzheimer- type vascular dementia. It is very important  that you work harder with diet by avoiding all foods that are white. Avoid white rice (brown & wild rice is OK), white potatoes (sweetpotatoes in moderation is OK), White bread or wheat bread or anything made out of white flour like bagels, donuts, rolls, buns, biscuits, cakes, pastries, cookies, pizza crust, and pasta (made from white flour & egg whites) - vegetarian pasta or spinach or wheat pasta is OK. Multigrain breads like Arnold's or Pepperidge Farm, or multigrain sandwich thins or flatbreads.  Diet, exercise and weight loss can reverse and cure diabetes in the early stages.  Diet, exercise and weight loss is very important in the control and prevention of complications of diabetes which affects every system in your body, ie. Brain - dementia/stroke, eyes - glaucoma/blindness, heart - heart attack/heart failure, kidneys - dialysis, stomach - gastric paralysis, intestines - malabsorption, nerves - severe painful neuritis, circulation - gangrene & loss of a leg(s), and finally cancer and Alzheimers.    I recommend avoid fried & greasy foods,  sweets/candy, white rice (brown or wild rice or Quinoa is OK), white potatoes (sweet potatoes are OK) - anything made from white flour - bagels, doughnuts, rolls, buns, biscuits,white and wheat breads, pizza crust and traditional pasta made of white flour & egg white(vegetarian pasta or spinach or wheat pasta is OK).  Multi-grain bread is OK - like multi-grain flat bread or sandwich thins. Avoid alcohol in excess. Exercise is also important.    Eat all the vegetables you want - avoid meat, especially red meat and dairy - especially cheese.  Cheese is the most concentrated form of trans-fats which is the worst  thing to clog up our arteries. Veggie cheese is OK which can be found in the fresh produce section at [employer] or [employer] or [employer]  +++++++++++++++++++ DASH Eating Plan  DASH stands for "Dietary Approaches to Stop Hypertension."   The DASH  eating plan is a healthy eating plan that has been shown to reduce high blood pressure (hypertension). Additional health benefits may include reducing the risk of type 2 diabetes mellitus, heart disease, and stroke. The DASH eating plan may also help with weight loss. WHAT DO I NEED TO KNOW ABOUT THE DASH EATING PLAN? For the DASH eating plan, you will follow these general guidelines:  Choose foods with a percent daily value for sodium of less than 5% (as listed on the food label).  Use salt-free seasonings or herbs instead of table salt or sea salt.  Check with your health care provider or pharmacist before using salt substitutes.  Eat lower-sodium products, often labeled as "lower sodium" or "no salt added."  Eat fresh foods.  Eat more vegetables, fruits, and low-fat dairy products.  Choose whole grains. Look for the word "whole" as the first word in the ingredient list.  Choose fish   Limit sweets, desserts, sugars, and sugary drinks.  Choose heart-healthy fats.  Eat veggie cheese   Eat more home-cooked food and less restaurant, buffet, and fast food.  Limit fried foods.  Cook foods using methods other than frying.  Limit canned vegetables. If you do use them, rinse them well to decrease the sodium.  When eating at a restaurant, ask that your food be prepared with less salt, or no salt if possible.                      WHAT FOODS CAN I EAT? Read Dr Fara Olden Fuhrman's books on The End of Dieting & The End of Diabetes  Grains Whole grain or whole wheat bread. Brown rice. Whole grain or whole wheat pasta. Quinoa, bulgur, and whole grain cereals. Low-sodium cereals. Corn or whole wheat flour tortillas. Whole grain cornbread. Whole grain crackers. Low-sodium crackers.  Vegetables Fresh or frozen vegetables (raw, steamed, roasted, or grilled). Low-sodium or reduced-sodium tomato and vegetable juices. Low-sodium or reduced-sodium tomato sauce and paste. Low-sodium or reduced-sodium  canned vegetables.   Fruits All fresh, canned (in natural juice), or frozen fruits.  Protein Products  All fish and seafood.  Dried beans, peas, or lentils. Unsalted nuts and seeds. Unsalted canned beans.  Dairy Low-fat dairy products, such as skim or 1% milk, 2% or reduced-fat cheeses, low-fat ricotta or cottage cheese, or plain low-fat yogurt. Low-sodium or reduced-sodium cheeses.  Fats and Oils Tub margarines without trans fats. Light or reduced-fat mayonnaise and salad dressings (reduced sodium). Avocado. Safflower, olive, or canola oils. Natural peanut or almond butter.  Other Unsalted popcorn and pretzels. The items listed above may not be a complete list of recommended foods or beverages. Contact your dietitian for more options.  +++++++++++++++  WHAT FOODS ARE NOT RECOMMENDED? Grains/ White flour or wheat flour White bread. White pasta. White rice. Refined cornbread. Bagels and croissants. Crackers that contain trans fat.  Vegetables  Creamed or fried vegetables. Vegetables in a . Regular canned vegetables. Regular canned tomato sauce and paste. Regular tomato and vegetable juices.  Fruits Dried fruits. Canned fruit in light or heavy syrup. Fruit juice.  Meat and Other Protein Products Meat in general - RED meat & White meat.  Fatty cuts of meat. Ribs, chicken wings, all  processed meats as bacon, sausage, bologna, salami, fatback, hot dogs, bratwurst and packaged luncheon meats.  Dairy Whole or 2% milk, cream, half-and-half, and cream cheese. Whole-fat or sweetened yogurt. Full-fat cheeses or blue cheese. Non-dairy creamers and whipped toppings. Processed cheese, cheese spreads, or cheese curds.  Condiments Onion and garlic salt, seasoned salt, table salt, and sea salt. Canned and packaged gravies. Worcestershire sauce. Tartar sauce. Barbecue sauce. Teriyaki sauce. Soy sauce, including reduced sodium. Steak sauce. Fish sauce. Oyster sauce. Cocktail sauce. Horseradish.  Ketchup and mustard. Meat flavorings and tenderizers. Bouillon cubes. Hot sauce. Tabasco sauce. Marinades. Taco seasonings. Relishes.  Fats and Oils Butter, stick margarine, lard, shortening and bacon fat. Coconut, palm kernel, or palm oils. Regular salad dressings.  Pickles and olives. Salted popcorn and pretzels.  The items listed above may not be a complete list of foods and beverages to avoid.  

## 2017-12-11 ENCOUNTER — Ambulatory Visit (INDEPENDENT_AMBULATORY_CARE_PROVIDER_SITE_OTHER): Payer: PPO | Admitting: Internal Medicine

## 2017-12-11 ENCOUNTER — Encounter: Payer: Self-pay | Admitting: Internal Medicine

## 2017-12-11 VITALS — BP 112/60 | HR 63 | Temp 98.5°F | Ht 62.5 in | Wt 103.0 lb

## 2017-12-11 DIAGNOSIS — E782 Mixed hyperlipidemia: Secondary | ICD-10-CM

## 2017-12-11 DIAGNOSIS — Z136 Encounter for screening for cardiovascular disorders: Secondary | ICD-10-CM | POA: Diagnosis not present

## 2017-12-11 DIAGNOSIS — Z79899 Other long term (current) drug therapy: Secondary | ICD-10-CM | POA: Diagnosis not present

## 2017-12-11 DIAGNOSIS — E559 Vitamin D deficiency, unspecified: Secondary | ICD-10-CM | POA: Diagnosis not present

## 2017-12-11 DIAGNOSIS — R0989 Other specified symptoms and signs involving the circulatory and respiratory systems: Secondary | ICD-10-CM

## 2017-12-11 DIAGNOSIS — Z0000 Encounter for general adult medical examination without abnormal findings: Secondary | ICD-10-CM | POA: Diagnosis not present

## 2017-12-11 DIAGNOSIS — Z1212 Encounter for screening for malignant neoplasm of rectum: Secondary | ICD-10-CM

## 2017-12-11 DIAGNOSIS — R030 Elevated blood-pressure reading, without diagnosis of hypertension: Secondary | ICD-10-CM | POA: Diagnosis not present

## 2017-12-11 DIAGNOSIS — Z131 Encounter for screening for diabetes mellitus: Secondary | ICD-10-CM

## 2017-12-11 DIAGNOSIS — Z8249 Family history of ischemic heart disease and other diseases of the circulatory system: Secondary | ICD-10-CM

## 2017-12-11 DIAGNOSIS — Z1211 Encounter for screening for malignant neoplasm of colon: Secondary | ICD-10-CM

## 2017-12-11 DIAGNOSIS — Z0001 Encounter for general adult medical examination with abnormal findings: Secondary | ICD-10-CM

## 2017-12-15 LAB — COMPLETE METABOLIC PANEL WITH GFR
AG Ratio: 2.7 (calc) — ABNORMAL HIGH (ref 1.0–2.5)
ALT: 17 U/L (ref 6–29)
AST: 22 U/L (ref 10–35)
Albumin: 5.1 g/dL (ref 3.6–5.1)
Alkaline phosphatase (APISO): 96 U/L (ref 33–130)
BUN: 11 mg/dL (ref 7–25)
CO2: 31 mmol/L (ref 20–32)
CREATININE: 0.78 mg/dL (ref 0.60–0.93)
Calcium: 10.5 mg/dL — ABNORMAL HIGH (ref 8.6–10.4)
Chloride: 104 mmol/L (ref 98–110)
GFR, EST AFRICAN AMERICAN: 88 mL/min/{1.73_m2} (ref >=60)
GFR, Est Non African American: 76 mL/min/{1.73_m2} (ref >=60)
Globulin: 1.9 g/dL (calc) (ref 1.9–3.7)
Glucose, Bld: 79 mg/dL (ref 65–99)
Potassium: 4.2 mmol/L (ref 3.5–5.3)
Sodium: 142 mmol/L (ref 135–146)
TOTAL PROTEIN: 7 g/dL (ref 6.1–8.1)
Total Bilirubin: 0.7 mg/dL (ref 0.2–1.2)

## 2017-12-15 LAB — VITAMIN D 25 HYDROXY (VIT D DEFICIENCY, FRACTURES): VIT D 25 HYDROXY: 59 ng/mL (ref 30–100)

## 2017-12-15 LAB — LIPID PANEL
Cholesterol: 226 mg/dL — ABNORMAL HIGH (ref <200)
HDL: 102 mg/dL (ref >50)
LDL CHOLESTEROL (CALC): 108 mg/dL — AB
Non-HDL Cholesterol (Calc): 124 mg/dL (calc) (ref <130)
TRIGLYCERIDES: 75 mg/dL (ref <150)
Total CHOL/HDL Ratio: 2.2 (calc) (ref <5.0)

## 2017-12-15 LAB — CBC WITH DIFFERENTIAL/PLATELET
BASOS PCT: 1.1 %
Basophils Absolute: 39 cells/uL (ref 0–200)
EOS PCT: 1.7 %
Eosinophils Absolute: 60 cells/uL (ref 15–500)
HCT: 40.3 % (ref 35.0–45.0)
HEMOGLOBIN: 13.8 g/dL (ref 11.7–15.5)
Lymphs Abs: 868 cells/uL (ref 850–3900)
MCH: 32.8 pg (ref 27.0–33.0)
MCHC: 34.2 g/dL (ref 32.0–36.0)
MCV: 95.7 fL (ref 80.0–100.0)
MPV: 9.9 fL (ref 7.5–12.5)
Monocytes Relative: 8.5 %
NEUTROS ABS: 2237 {cells}/uL (ref 1500–7800)
Neutrophils Relative %: 63.9 %
PLATELETS: 176 10*3/uL (ref 140–400)
RBC: 4.21 10*6/uL (ref 3.80–5.10)
RDW: 11.8 % (ref 11.0–15.0)
Total Lymphocyte: 24.8 %
WBC mixed population: 298 cells/uL (ref 200–950)
WBC: 3.5 10*3/uL — ABNORMAL LOW (ref 3.8–10.8)

## 2017-12-15 LAB — TSH: TSH: 0.62 mIU/L (ref 0.40–4.50)

## 2017-12-15 LAB — URINALYSIS, ROUTINE W REFLEX MICROSCOPIC
BILIRUBIN URINE: NEGATIVE
GLUCOSE, UA: NEGATIVE
HGB URINE DIPSTICK: NEGATIVE
KETONES UR: NEGATIVE
Leukocytes, UA: NEGATIVE
Nitrite: NEGATIVE
PROTEIN: NEGATIVE
Specific Gravity, Urine: 1.01 (ref 1.001–1.03)
pH: 7.5 (ref 5.0–8.0)

## 2017-12-15 LAB — INSULIN, RANDOM: Insulin: 5.7 u[IU]/mL (ref 2.0–19.6)

## 2017-12-15 LAB — HEMOGLOBIN A1C
EAG (MMOL/L): 5.7 (calc)
Hgb A1c MFr Bld: 5.2 % of total Hgb (ref <5.7)
Mean Plasma Glucose: 103 (calc)

## 2017-12-15 LAB — MICROALBUMIN / CREATININE URINE RATIO
CREATININE, URINE: 30 mg/dL (ref 20–275)
Microalb, Ur: <0.2 mg/dL

## 2017-12-15 LAB — MAGNESIUM: Magnesium: 2.5 mg/dL (ref 1.5–2.5)

## 2017-12-16 ENCOUNTER — Other Ambulatory Visit: Payer: Self-pay | Admitting: Internal Medicine

## 2017-12-16 ENCOUNTER — Encounter: Payer: Self-pay | Admitting: Internal Medicine

## 2017-12-16 DIAGNOSIS — E2839 Other primary ovarian failure: Secondary | ICD-10-CM

## 2017-12-16 DIAGNOSIS — Z1212 Encounter for screening for malignant neoplasm of rectum: Secondary | ICD-10-CM

## 2017-12-16 DIAGNOSIS — Z1211 Encounter for screening for malignant neoplasm of colon: Secondary | ICD-10-CM

## 2018-01-07 ENCOUNTER — Other Ambulatory Visit: Payer: Self-pay

## 2018-01-07 DIAGNOSIS — Z1211 Encounter for screening for malignant neoplasm of colon: Secondary | ICD-10-CM | POA: Diagnosis not present

## 2018-01-07 DIAGNOSIS — Z1212 Encounter for screening for malignant neoplasm of rectum: Principal | ICD-10-CM

## 2018-01-07 LAB — POC HEMOCCULT BLD/STL (HOME/3-CARD/SCREEN)
Card #2 Fecal Occult Blod, POC: NEGATIVE
Card #3 Fecal Occult Blood, POC: NEGATIVE
Fecal Occult Blood, POC: NEGATIVE

## 2018-01-18 DIAGNOSIS — M79671 Pain in right foot: Secondary | ICD-10-CM | POA: Diagnosis not present

## 2018-01-26 ENCOUNTER — Encounter: Payer: Self-pay | Admitting: *Deleted

## 2018-02-03 ENCOUNTER — Telehealth: Payer: Self-pay | Admitting: Internal Medicine

## 2018-02-03 NOTE — Telephone Encounter (Signed)
patient requesting order to Cologuard, 66yr recall. Per Dr Melford Aase, order sent 12-16-17. Refaxed Cologuard order to [employer]. patient aware.

## 2018-02-24 ENCOUNTER — Other Ambulatory Visit: Payer: Self-pay | Admitting: Internal Medicine

## 2018-02-24 ENCOUNTER — Ambulatory Visit
Admission: RE | Admit: 2018-02-24 | Discharge: 2018-02-24 | Disposition: A | Discharge status: Home / Self Care | Payer: PPO | Source: Ambulatory Visit | Attending: Internal Medicine | Admitting: Internal Medicine

## 2018-02-24 DIAGNOSIS — E2839 Other primary ovarian failure: Secondary | ICD-10-CM

## 2018-02-24 DIAGNOSIS — Z780 Asymptomatic menopausal state: Secondary | ICD-10-CM | POA: Diagnosis not present

## 2018-02-24 DIAGNOSIS — M810 Age-related osteoporosis without current pathological fracture: Secondary | ICD-10-CM

## 2018-02-24 MED ORDER — ALENDRONATE SODIUM 70 MG PO TABS: ORAL_TABLET | ORAL | 3 refills | Status: DC

## 2018-03-01 DIAGNOSIS — Z1211 Encounter for screening for malignant neoplasm of colon: Secondary | ICD-10-CM | POA: Diagnosis not present

## 2018-03-10 ENCOUNTER — Telehealth: Payer: Self-pay | Admitting: *Deleted

## 2018-03-10 LAB — COLOGUARD: Cologuard: NEGATIVE

## 2018-03-10 NOTE — Telephone Encounter (Signed)
Patient is aware of negative Cologuard and 3 year follow up. 

## 2018-03-15 ENCOUNTER — Encounter: Payer: Self-pay | Admitting: *Deleted

## 2018-03-17 NOTE — Progress Notes (Signed)
MEDICARE ANNUAL WELLNESS VISIT AND FOLLOW UP  Assessment:    Diagnoses and all orders for this visit:  Encounter for Medicare annual wellness exam  Labile hypertension Controlled today off of medication Monitor blood pressure at home; call if consistently over 130/80 Continue DASH diet.   Reminder to go to the ER if any CP, SOB, nausea, dizziness, severe HA, changes vision/speech, left arm numbness and tingling and jaw pain.  Age related osteoporosis, unspecified pathological fracture presence T -4.0 2019, fosamax recommended, patient declines, wants to try alternative therapy, will repeat in 2 years Recommended calcium 600 mg daily supplement, vitamin D supplement, emphasized daily resistance/high impact exercises and reviewed these with patient  Vitamin D deficiency At goal at recent check; continue to recommend supplementation for goal of 70-100 Defer vitamin D level  Hyperlipidemia, mixed Continue low cholesterol diet and exercise.  Check lipid panel annually at CPE  FHx: heart disease Control blood pressure, cholesterol, glucose, increase exercise.   Adult BMI <19 kg/sq m Continue to recommend diet heavy in fruits and veggies and low in animal meats, cheeses, and dairy products, appropriate calorie intake Discuss exercise recommendations routinely Continue to monitor weight at each visit  Over 30 minutes of exam, counseling, chart review, and critical decision making was performed  Future Appointments  Date Time Provider Star  12/24/2018  9:00 AM [name], MD [facility] None    Plan:   During the course of the visit the patient was educated and counseled about appropriate screening and preventive services including:    Pneumococcal vaccine   Influenza vaccine  Td vaccine  Prevnar 13  Screening electrocardiogram  Screening mammography  Bone densitometry screening  Colorectal cancer screening  Diabetes screening  Glaucoma  screening  Nutrition counseling   Advanced directives: given info/requested copies   Subjective:   Tricia [name] is a 72 y.o. female who presents for Medicare Annual Wellness Visit and 3 month follow up on hypertension, glucose management, hyperlipidemia, vitamin D def.   BMI is Body mass index is 18.5 kg/m., she has been working on diet and exercise. Wt Readings from Last 3 Encounters:  03/18/18 102 lb 12.8 oz (46.6 kg)  12/11/17 103 lb (46.7 kg)  11/27/16 102 lb 6.4 oz (46.4 kg)   Her blood pressure has been controlled at home, today their BP is BP: 106/64 She does not workout. She denies chest pain, shortness of breath, dizziness.   She is not on cholesterol medication and denies myalgias. Her cholesterol is not at goal. The cholesterol last visit was:   Lab Results  Component Value Date   CHOL 226 (H) 12/11/2017   HDL 102 12/11/2017   LDLCALC 108 (H) 12/11/2017   TRIG 75 12/11/2017   CHOLHDL 2.2 12/11/2017   She has a history of diet controlled glucose.  She has been screened for diabetes.  Her A1c's most recently have been normal.    Last A1C in the office was:  Lab Results  Component Value Date   HGBA1C 5.2 12/11/2017   Last GFR Lab Results  Component Value Date   GFRNONAA 76 12/11/2017   Patient is on Vitamin D supplement. Lab Results  Component Value Date   VD25OH 60 12/11/2017     She has no complaints today.  She is declining all vaccines.  She is opposed to getting vaccines because she doesn't want to be exposed to preservatives.   She refuses further mammograms as well.    Medication Review Current Outpatient Medications  on File Prior to Visit  Medication Sig Dispense Refill  . Ascorbic Acid (VITAMIN C PO) Take 1 tablet by mouth daily.     . B Complex Vitamins (VITAMIN B COMPLEX PO) Take by mouth.    . Cholecalciferol (VITAMIN D PO) Take 5,000 Units by mouth daily.     . Multiple Vitamins-Minerals (MULTIVITAMIN PO) Take 1 tablet by mouth daily.      . Omega-3 Fatty Acids (OMEGA 3 PO) Take 500 mg by mouth 2 (two) times daily.    Marland Kitchen OVER THE COUNTER MEDICATION Iodine 7 drops daily    . OVER THE COUNTER MEDICATION thytrophen  3 per day.    Marland Kitchen OVER THE COUNTER MEDICATION OsteoSheath 1224 mg daily.    Marland Kitchen OVER THE COUNTER MEDICATION     . OVER THE COUNTER MEDICATION     . Probiotic Product (PROBIOTIC DAILY PO) Take 1 tablet by mouth daily.     . Pyridoxine HCl (VITAMIN B-6) 500 MG tablet Take 500 mg by mouth daily.    Marland Kitchen alendronate (FOSAMAX) 70 MG tablet Take 1 tablet on an empty stomach with a full glass of water and no food to 30 minutes  once /weekly . 12 tablet 3   No current facility-administered medications on file prior to visit.     Allergies: Allergies  Allergen Reactions  . Sulfa Antibiotics     GI problems  . Iodine Rash    Patient states she had a rash with IV contrast  . Penicillins Rash    Current Problems (verified) has Fatigue; Vitamin D deficiency; Osteoporosis; FHx: heart disease; Hyperlipidemia, mixed; and Labile hypertension on their problem list.  Screening Tests  There is no immunization history on file for this patient.  Preventative care: Last colonoscopy: remote,  Cologuard: 02/2018 neg Last mammogram: Obgyn, 2018, gets q2y DEXA:2019, T -4.0, prior in 2017 was T -4.1, declines fosamax, wants to try alternative therapy  Prior vaccinations: DECLINES ALL VACCINES  Names of Other Physician/Practitioners you currently use: 1. [facility]- here for primary care 2. Dr. [place], eye doctor, last visit 2019 3. Dr. Pablo Lawrence, dentist, last visit 2019  Patient Care Team: [name], MD as PCP - General (Internal Medicine) Paula Compton, MD as Consulting Physician (Obstetrics and Gynecology) Richmond Campbell, MD as Consulting Physician (Gastroenterology)  Surgical: She  has a past surgical history that includes Ovarian cyst removal (1973). Family Her family  history includes Cancer in her father; Goiter in her mother; Heart attack in her father; Leukemia in her mother; Stroke in her father. Social history  She reports that she has never smoked. She has never used smokeless tobacco. She reports that she does not drink alcohol or use drugs.  MEDICARE WELLNESS OBJECTIVES: Physical activity: Current Exercise Habits: Home exercise routine, Type of exercise: strength training/weights;stretching, Time (Minutes): 25, Frequency (Times/Week): 2, Weekly Exercise (Minutes/Week): 50, Intensity: Mild, Exercise limited by: None identified Cardiac risk factors: Cardiac Risk Factors include: dyslipidemia;advanced age (>18men, >82 women) Depression/mood screen:   Depression screen [facility] 2/9 03/18/2018  Decreased Interest 0  Down, Depressed, Hopeless 0  PHQ - 2 Score 0  Altered sleeping -  Tired, decreased energy -  Change in appetite -  Feeling bad or failure about yourself  -  Trouble concentrating -  Moving slowly or fidgety/restless -  Suicidal thoughts -  PHQ-9 Score -    ADLs:  In your present state of health, do you have any difficulty performing the following activities:  03/18/2018 12/10/2017  Hearing? N N  Vision? N N  Difficulty concentrating or making decisions? N N  Walking or climbing stairs? N N  Dressing or bathing? N N  Doing errands, shopping? N N  Some recent data might be hidden     Cognitive Testing  Alert? Yes  Normal Appearance?Yes  Oriented to person? Yes  Place? Yes   Time? Yes  Recall of three objects?  Yes  Can perform simple calculations? Yes  Displays appropriate judgment?Yes  Can read the correct time from a watch face?Yes  EOL planning: Does Patient Have a Medical Advance Directive?: No Would patient like information on creating a medical advance directive?: Yes (MAU/Ambulatory/Procedural Areas - Information given)   Objective:   Today's Vitals   03/18/18 1447  BP: 106/64  Pulse: 61  Temp: 97.7 F (36.5 C)  SpO2:  97%  Weight: 102 lb 12.8 oz (46.6 kg)  Height: 5' 2.5" (1.588 m)   Body mass index is 18.5 kg/m.  General appearance: alert, no distress, WD/WN,  female HEENT: normocephalic, sclerae anicteric, TMs pearly, nares patent, no discharge or erythema, pharynx normal Oral cavity: MMM, no lesions Neck: supple, no lymphadenopathy, no thyromegaly, no masses Heart: RRR, normal S1, S2, no murmurs Lungs: CTA bilaterally, no wheezes, rhonchi, or rales Abdomen: +bs, soft, non tender, non distended, no masses, no hepatomegaly, no splenomegaly Musculoskeletal: nontender, no swelling, no obvious deformity Extremities: no edema, no cyanosis, no clubbing Pulses: 2+ symmetric, upper and lower extremities, normal cap refill Neurological: alert, oriented x 3, CN2-12 intact, strength normal upper extremities and lower extremities, sensation normal throughout, DTRs 2+ throughout, no cerebellar signs, gait normal Psychiatric: normal affect, behavior normal, pleasant  Breast: defer Gyn: defer Rectal: defer   Medicare Attestation I have personally reviewed: The patient's medical and social history Their use of alcohol, tobacco or illicit drugs Their current medications and supplements The patient's functional ability including ADLs,fall risks, home safety risks, cognitive, and hearing and visual impairment Diet and physical activities Evidence for depression or mood disorders  The patient's weight, height, BMI, and visual acuity have been recorded in the chart.  I have made referrals, counseling, and provided education to the patient based on review of the above and I have provided the patient with a written personalized care plan for preventive services.     Izora Ribas, NP   03/18/2018

## 2018-03-18 ENCOUNTER — Encounter: Payer: Self-pay | Admitting: Adult Health

## 2018-03-18 ENCOUNTER — Ambulatory Visit (INDEPENDENT_AMBULATORY_CARE_PROVIDER_SITE_OTHER): Payer: PPO | Admitting: Adult Health

## 2018-03-18 VITALS — BP 106/64 | HR 61 | Temp 97.7°F | Ht 62.5 in | Wt 102.8 lb

## 2018-03-18 DIAGNOSIS — Z681 Body mass index (BMI) 19 or less, adult: Secondary | ICD-10-CM | POA: Diagnosis not present

## 2018-03-18 DIAGNOSIS — E559 Vitamin D deficiency, unspecified: Secondary | ICD-10-CM

## 2018-03-18 DIAGNOSIS — Z0000 Encounter for general adult medical examination without abnormal findings: Secondary | ICD-10-CM

## 2018-03-18 DIAGNOSIS — Z0001 Encounter for general adult medical examination with abnormal findings: Secondary | ICD-10-CM | POA: Diagnosis not present

## 2018-03-18 DIAGNOSIS — R5383 Other fatigue: Secondary | ICD-10-CM | POA: Diagnosis not present

## 2018-03-18 DIAGNOSIS — Z8249 Family history of ischemic heart disease and other diseases of the circulatory system: Secondary | ICD-10-CM | POA: Diagnosis not present

## 2018-03-18 DIAGNOSIS — E782 Mixed hyperlipidemia: Secondary | ICD-10-CM | POA: Diagnosis not present

## 2018-03-18 DIAGNOSIS — R0989 Other specified symptoms and signs involving the circulatory and respiratory systems: Secondary | ICD-10-CM

## 2018-03-18 DIAGNOSIS — M810 Age-related osteoporosis without current pathological fracture: Secondary | ICD-10-CM | POA: Diagnosis not present

## 2018-03-18 DIAGNOSIS — R6889 Other general symptoms and signs: Secondary | ICD-10-CM

## 2018-03-18 NOTE — Patient Instructions (Addendum)
Tricia [name] , Thank you for taking time to come for your Medicare Wellness Visit. I appreciate your ongoing commitment to your health goals. Please review the following plan we discussed and let me know if I can assist you in the future.   These are the goals we discussed: Goals    . DIET - INCREASE WATER INTAKE     65+ fluid ounces daily     . Exercise 150 min/wk Moderate Activity     Weight bearing/high impact exercises every other day for bone health       This is a list of the screening recommended for you and due dates:  Health Maintenance  Topic Date Due  . Cologuard (Stool DNA test)  03/10/2021  . DEXA scan (bone density measurement)  Completed  . Flu Shot  Discontinued  . Mammogram  Discontinued  . Tetanus Vaccine  Discontinued  .  Hepatitis C: One time screening is recommended by Center for Disease Control  (CDC) for  adults born from [birth year] through 1965.   Discontinued  . Pneumonia vaccines  Discontinued    Know what a healthy weight is for you (roughly BMI <25) and aim to maintain this  Aim for 7+ servings of fruits and vegetables daily  65-80+ fluid ounces of water or unsweet tea for healthy kidneys  Limit to max 1 drink of alcohol per day; avoid smoking/tobacco  Limit animal fats in diet for cholesterol and heart health - choose grass fed whenever available  Avoid highly processed foods, and foods high in saturated/trans fats  Aim for low stress - take time to unwind and care for your mental health  Aim for 150 min of moderate intensity exercise weekly for heart health, and weights twice weekly for bone health  Aim for 7-9 hours of sleep daily     Preventing Osteoporosis, Adult Osteoporosis is a condition that causes the bones to get weaker. With osteoporosis, the bones become thinner, and the normal spaces in bone tissue become larger. This can make the bones weak and cause them to break more easily. People who have osteoporosis are more likely to break  their wrist, spine, or hip. Even a minor accident or injury can be enough to break weak bones. Osteoporosis can occur with aging. Your body constantly replaces old bone tissue with new tissue. As you get older, you may lose bone tissue more quickly, or it may be replaced more slowly. Osteoporosis is more likely to develop if you have poor nutrition or do not get enough calcium or vitamin D. Other lifestyle factors can also play a role. By making some diet and lifestyle changes, you can help to keep your bones healthy and help to prevent osteoporosis. What nutrition changes can be made? Nutrition plays an important role in maintaining healthy, strong bones.  Make sure you get enough calcium every day from food or from calcium supplements. ? If you are age 24 or younger, aim to get 1,000 mg of calcium every day. ? If you are older than age 44, aim to get 1,200 mg of calcium every day.  Try to get enough vitamin D every day. ? If you are age 64 or younger, aim to get 600 international units (IU) every day. ? If you are older than age 47, aim to get 800 international units every day.  Follow a healthy diet. Eat plenty of foods that contain calcium and vitamin D. ? Calcium is in milk, cheese, yogurt, and other dairy products.  Some fish and vegetables are also good sources of calcium. Many foods such as cereals and breads have had calcium added to them (are fortified). Check nutrition labels to see how much calcium is in a food or drink. ? Foods that contain vitamin D include milk, cereals, salmon, and tuna. Your body also makes vitamin D when you are out in the sun. Bare skin exposure to the sun on your face, arms, legs, or back for no more than 30 minutes a day, 2 times per week is more than enough. Beyond that, it is important to use sunblock to protect your skin from sunburn, which increases your risk for skin cancer.  What lifestyle changes can be made? Making changes in your everyday life can also  play an important role in preventing osteoporosis.  Stay active and get exercise every day. Ask your health care provider what types of exercise are best for you.  Do not use any products that contain nicotine or tobacco, such as cigarettes and e-cigarettes. If you need help quitting, ask your health care provider.  Limit alcohol intake to no more than 1 drink a day for nonpregnant women and 2 drinks a day for men. One drink equals 12 oz of beer, 5 oz of wine, or 1 oz of hard liquor.  Why are these changes important? Making these nutrition and lifestyle changes can:  Help you develop and maintain healthy, strong bones.  Prevent loss of bone mass and the problems that are caused by that loss, such as broken bones and delayed healing.  Make you feel better mentally and physically.  What can happen if changes are not made? Problems that can result from osteoporosis can be very serious. These may include:  A higher risk of broken bones that are painful and do not heal well.  Physical malformations, such as a collapsed spine or a hunched back.  Problems with movement.  Where to find support: If you need help making changes to prevent osteoporosis, talk with your health care provider. You can ask for a referral to a diet and nutrition specialist (dietitian) and a physical therapist. Where to find more information: Learn more about osteoporosis from:  NIH Osteoporosis and Related [place]: www.niams.GolfingGoddess.com.br  U.S. Office on Women's Health: SouvenirBaseball.es.html  National Osteoporosis Foundation: ProfilePeek.ch  Summary  Osteoporosis is a condition that causes weak bones that are more likely to break.  Eating a healthy diet and making sure you get enough calcium and vitamin D can help prevent osteoporosis.  Other ways  to reduce your risk of osteoporosis include getting regular exercise and avoiding alcohol and products that contain nicotine or tobacco. This information is not intended to replace advice given to you by your health care provider. Make sure you discuss any questions you have with your health care provider. Document Released: 04/15/2015 Document Revised: 12/10/2015 Document Reviewed: 12/10/2015 Elsevier Interactive Patient Education  [employer].

## 2018-03-19 LAB — COMPLETE METABOLIC PANEL WITH GFR
AG RATIO: 2.3 (calc) (ref 1.0–2.5)
ALBUMIN MSPROF: 4.6 g/dL (ref 3.6–5.1)
ALKALINE PHOSPHATASE (APISO): 100 U/L (ref 33–130)
ALT: 15 U/L (ref 6–29)
AST: 23 U/L (ref 10–35)
BILIRUBIN TOTAL: 0.5 mg/dL (ref 0.2–1.2)
BUN: 15 mg/dL (ref 7–25)
CHLORIDE: 105 mmol/L (ref 98–110)
CO2: 28 mmol/L (ref 20–32)
Calcium: 10 mg/dL (ref 8.6–10.4)
Creat: 0.79 mg/dL (ref 0.60–0.93)
GFR, Est African American: 87 mL/min/{1.73_m2} (ref >=60)
GFR, Est Non African American: 75 mL/min/{1.73_m2} (ref >=60)
GLOBULIN: 2 g/dL (ref 1.9–3.7)
GLUCOSE: 96 mg/dL (ref 65–99)
POTASSIUM: 4.2 mmol/L (ref 3.5–5.3)
SODIUM: 140 mmol/L (ref 135–146)
Total Protein: 6.6 g/dL (ref 6.1–8.1)

## 2018-03-19 LAB — LIPID PANEL
Cholesterol: 185 mg/dL (ref <200)
HDL: 84 mg/dL (ref >50)
LDL Cholesterol (Calc): 85 mg/dL (calc)
NON-HDL CHOLESTEROL (CALC): 101 mg/dL (ref <130)
Total CHOL/HDL Ratio: 2.2 (calc) (ref <5.0)
Triglycerides: 72 mg/dL (ref <150)

## 2018-03-19 LAB — CBC WITH DIFFERENTIAL/PLATELET
BASOS PCT: 0.8 %
Basophils Absolute: 30 cells/uL (ref 0–200)
Eosinophils Absolute: 61 cells/uL (ref 15–500)
Eosinophils Relative: 1.6 %
HEMATOCRIT: 38.3 % (ref 35.0–45.0)
HEMOGLOBIN: 13.3 g/dL (ref 11.7–15.5)
LYMPHS ABS: 1075 {cells}/uL (ref 850–3900)
MCH: 32.6 pg (ref 27.0–33.0)
MCHC: 34.7 g/dL (ref 32.0–36.0)
MCV: 93.9 fL (ref 80.0–100.0)
MPV: 10.4 fL (ref 7.5–12.5)
Monocytes Relative: 9 %
NEUTROS ABS: 2291 {cells}/uL (ref 1500–7800)
Neutrophils Relative %: 60.3 %
Platelets: 177 10*3/uL (ref 140–400)
RBC: 4.08 10*6/uL (ref 3.80–5.10)
RDW: 11.3 % (ref 11.0–15.0)
Total Lymphocyte: 28.3 %
WBC mixed population: 342 cells/uL (ref 200–950)
WBC: 3.8 10*3/uL (ref 3.8–10.8)

## 2018-03-19 LAB — TSH: TSH: 1.11 mIU/L (ref 0.40–4.50)

## 2018-03-23 ENCOUNTER — Telehealth: Payer: Self-pay

## 2018-03-23 NOTE — Telephone Encounter (Signed)
Patient aware that last mammogram was done is 2016 and she will call to schedule appointment to have the alternative scan done.

## 2018-06-23 ENCOUNTER — Ambulatory Visit: Payer: Self-pay | Admitting: Adult Health

## 2018-08-05 NOTE — Progress Notes (Addendum)
Virtual Visit via Televideo Note  I connected with Tricia [name] on 08/06/18 at 10:00 AM EDT virtual visit by televideo modality and verified that I am speaking with the correct person using two identifiers.   I discussed the limitations, risks, security and privacy concerns of performing an evaluation and management service and the availability of in person appointments. I also discussed with the patient that there may be a patient responsible charge related to this service. The patient expressed understanding and agreed to proceed.   MEDICARE ANNUAL WELLNESS VISIT AND FOLLOW UP Assessment:   Diagnoses and all orders for this visit:  Encounter for Medicare annual wellness exam Yearly  Labile hypertension Doing well Monitor blood pressure in office Monitors diet, decreased sodium Will continue to monitor  Hyperlipidemia, mixed Taking Omega-3, 2,000mg  daily Discussed dietary and exercise modifications  Age related osteoporosis, unspecified pathological fracture presence Taking Calcium and Vitamin D Supplementation Participates in strengthening and stretching exercises at home Dexa screening   Vitamin D deficiency Doing well Continue supplementation  Follow Up Instructions:    I discussed the assessment and treatment plan with the patient. The patient was provided an opportunity to ask questions and all were answered. The patient agreed with the plan and demonstrated an understanding of the instructions.   The patient was advised to call back or seek an in-person evaluation if the symptoms worsen or if the condition fails to improve as anticipated.  I provided 30 minutes of non-face-to-face time during this encounter including counseling, chart review, and critical decision making was preformed.   Future Appointments  Date Time Provider [place]  12/24/2018  9:00 AM Tricia Pinto, MD [facility] None  08/16/2019 10:00 AM Tricia [name] [facility] None       Plan:   During the course of the visit the patient was educated and counseled about appropriate screening and preventive services including:    Pneumococcal vaccine   Influenza vaccine  Prevnar 13  Td vaccine  Screening electrocardiogram, deferred, telephone visit [place].  Colorectal cancer screening  Diabetes screening  Glaucoma screening  Nutrition counseling    Subjective:  Tricia [name] is a 73 y.o. female who presents for Medicare Annual Wellness Visit and 3 month follow up for labile HTN, hyperlipidemia, osteoporosis, and vitamin D Def. Reports she has been doing well since last visit and no health concerns today.  Patient does not have resources to check blood pressure or temperature at home.  Her blood pressure has been controlled at home, today their BP is   She does workout. She denies chest pain, shortness of breath, dizziness.  She is not on cholesterol medication and denies myalgias. Her cholesterol is at goal. The cholesterol last visit was:   Lab Results  Component Value Date   CHOL 185 03/18/2018   HDL 84 03/18/2018   LDLCALC 85 03/18/2018   TRIG 72 03/18/2018   CHOLHDL 2.2 03/18/2018    Last GFR Lab Results  Component Value Date   GFRNONAA 75 03/18/2018     Lab Results  Component Value Date   GFRAA 87 03/18/2018   Patient is on Vitamin D supplement.   Lab Results  Component Value Date   VD25OH 59 12/11/2017      Medication Review:       Current Outpatient Medications (Other):  Marland Kitchen  Ascorbic Acid (VITAMIN C PO), Take 1 tablet by mouth daily.  .  B Complex Vitamins (VITAMIN B COMPLEX PO), Take by mouth. .  Cholecalciferol (  VITAMIN D PO), Take 5,000 Units by mouth daily.  .  Multiple Vitamins-Minerals (MULTIVITAMIN PO), Take 1 tablet by mouth daily.  .  Omega-3 Fatty Acids (OMEGA 3 PO), Take 500 mg by mouth 2 (two) times daily. Marland Kitchen  OVER THE COUNTER MEDICATION, Iodine 7 drops daily .  OVER THE COUNTER MEDICATION, thytrophen  3  per day. Marland Kitchen  OVER THE COUNTER MEDICATION, OsteoSheath 1224 mg daily. Marland Kitchen  OVER THE COUNTER MEDICATION,  .  OVER THE COUNTER MEDICATION,  .  Probiotic Product (PROBIOTIC DAILY PO), Take 1 tablet by mouth daily.  .  Pyridoxine HCl (VITAMIN B-6) 500 MG tablet, Take 500 mg by mouth daily.  Allergies: Allergies  Allergen Reactions  . Sulfa Antibiotics     GI problems  . Iodine Rash    Patient states she had a rash with IV contrast  . Penicillins Rash    Current Problems (verified) has Fatigue; Vitamin D deficiency; Osteoporosis; FHx: heart disease; Hyperlipidemia, mixed; and Labile hypertension on their problem list.  Screening Tests  There is no immunization history on file for this patient.  Preventative care: Last colonoscopy: remote Mammogram 2018 Tricia [name]  DEXA: 2019, T-4.0, 2017 T-4.1, [name]  Prior vaccinations: DECLINES ALL VACCINATIONS   Names of Other Physician/Practitioners you currently use: 1. Tricia [name] here for primary care 2. Tricia [name], 2019, [name] 2020 3. Tricia [name], 2019, [name] 2020  Patient Care Team: Tricia Pinto, MD as PCP - General (Internal [name]) Tricia Compton, MD as Consulting Physician (Obstetrics and Gynecology) Tricia Campbell, MD as Consulting Physician (Gastroenterology)  Surgical: She  has a past surgical history that includes Ovarian cyst removal (1973). Family Her family history includes Cancer in her father; Goiter in her mother; Heart attack in her father; Leukemia in her mother; Stroke in her father. Social history  She reports that she has never smoked. She has never used smokeless tobacco. She reports that she does not drink alcohol or use drugs.  MEDICARE WELLNESS OBJECTIVES: Physical activity: Current Exercise Habits: Home exercise routine, Type of exercise: stretching, Time (Minutes): 30, Frequency (Times/Week): 5, Weekly Exercise (Minutes/Week): 150, Intensity: Moderate, Exercise  limited by: cardiac condition(s) Cardiac risk factors: Cardiac Risk Factors include: advanced age (>53men, >33 women);dyslipidemia Depression/mood screen:   Depression screen [facility] 2/9 08/06/2018  Decreased Interest 0  Down, Depressed, Hopeless 0  PHQ - 2 Score 0  Altered sleeping -  Tired, decreased energy -  Change in appetite -  Feeling bad or failure about yourself  -  Trouble concentrating -  Moving slowly or fidgety/restless -  Suicidal thoughts -  PHQ-9 Score -    ADLs:  In your present state of health, do you have any difficulty performing the following activities: 08/06/2018 03/18/2018  Hearing? N N  Vision? N N  Difficulty concentrating or making decisions? N N  Walking or climbing stairs? N N  Dressing or bathing? N N  Doing errands, shopping? N N  Preparing Food and eating ? N -  Using the Toilet? N -  In the past six months, have you accidently leaked urine? N -  Do you have problems with loss of bowel control? N -  Managing your Medications? N -  Managing your Finances? N -  Housekeeping or managing your Housekeeping? N -  Some recent data might be hidden     Cognitive Testing  Alert? Yes  Normal Appearance?Yes  Oriented to person? Yes  Place? Yes   Time? Yes  Recall of three objects?  Yes  Can perform simple calculations? Yes  Displays appropriate judgment?Yes  Can read the correct time from a watch face?Yes  EOL planning: Does Patient Have a Medical Advance Directive?: Yes Type of Advance Directive: Living will, Healthcare Power of Attorney Does patient want to make changes to medical advance directive?: Yes (Inpatient - patient defers changing a medical advance directive at this time - Information given) Copy of [place] in Chart?: No - copy requested   Objective:   There were no vitals filed for this visit. There is no height or weight on file to calculate BMI.  General : Well sounding patient in no apparent distress HEENT: no  hoarseness, no cough for duration of visit Lungs: speaks in complete sentences, no audible wheezing, no apparent distress Neurological: alert, oriented x 3 Psychiatric: pleasant, judgement appropriate   Medicare Attestation I have personally reviewed: The patient's medical and social history Their use of alcohol, tobacco or illicit drugs Their current medications and supplements The patient's functional ability including ADLs,fall risks, home safety risks, cognitive, and hearing and visual impairment Diet and physical activities Evidence for depression or mood disorders  The patient's weight, height, BMI, and visual acuity have been recorded in the chart.  I have made referrals, counseling, and provided education to the patient based on review of the above and I have provided the patient with a written personalized care plan for preventive services.     Tricia [name] [name] 08/06/2018  1:00 PM

## 2018-08-06 ENCOUNTER — Encounter: Payer: Self-pay | Admitting: Adult Health Nurse Practitioner

## 2018-08-06 ENCOUNTER — Other Ambulatory Visit: Payer: Self-pay

## 2018-08-06 ENCOUNTER — Ambulatory Visit: Payer: PPO | Admitting: Adult Health Nurse Practitioner

## 2018-08-06 VITALS — Ht 62.0 in | Wt 102.0 lb

## 2018-08-06 DIAGNOSIS — E559 Vitamin D deficiency, unspecified: Secondary | ICD-10-CM

## 2018-08-06 DIAGNOSIS — R6889 Other general symptoms and signs: Secondary | ICD-10-CM | POA: Diagnosis not present

## 2018-08-06 DIAGNOSIS — E782 Mixed hyperlipidemia: Secondary | ICD-10-CM | POA: Diagnosis not present

## 2018-08-06 DIAGNOSIS — M810 Age-related osteoporosis without current pathological fracture: Secondary | ICD-10-CM

## 2018-08-06 DIAGNOSIS — R0989 Other specified symptoms and signs involving the circulatory and respiratory systems: Secondary | ICD-10-CM

## 2018-08-06 DIAGNOSIS — Z0001 Encounter for general adult medical examination with abnormal findings: Secondary | ICD-10-CM

## 2018-08-06 DIAGNOSIS — Z0000 Encounter for general adult medical examination without abnormal findings: Secondary | ICD-10-CM

## 2018-08-06 NOTE — Patient Instructions (Addendum)
[name] , Thank you for taking time to come for your Medicare Wellness Visit. I appreciate your ongoing commitment to your health goals. Please review the following plan we discussed and let me know if I can assist you in the future.   These are the goals we discussed: Goals    . DIET - INCREASE WATER INTAKE     65+ fluid ounces daily     . Exercise 150 min/wk Moderate Activity     Weight bearing/high impact exercises every other day for bone health       This is a list of the screening recommended for you and due dates:  Health Maintenance  Topic Date Due  . Cologuard (Stool DNA test)  03/10/2021  . DEXA scan (bone density measurement)  Completed  . Flu Shot  Discontinued  . Mammogram  Discontinued  . Tetanus Vaccine  Discontinued  .  Hepatitis C: One time screening is recommended by Center for Disease Control  (CDC) for  adults born from [birth year] through 1965.   Discontinued  . Pneumonia vaccines  Discontinued    You are due for an eye and dental exam once the COVID19 restrictions have been lifted.   Know what a healthy weight is for you (roughly BMI <25) and aim to maintain this  Aim for 7+ servings of fruits and vegetables daily  65-80+ fluid ounces of water or unsweet tea for healthy kidneys  Limit to max 1 drink of alcohol per day; avoid smoking/tobacco  Limit animal fats in diet for cholesterol and heart health - choose grass fed whenever available  Avoid highly processed foods, and foods high in saturated/trans fats  Aim for low stress - take time to unwind and care for your mental health  Aim for 150 min of moderate intensity exercise weekly for heart health, and weights twice weekly for bone health  Aim for 7-9 hours of sleep daily     Preventing Osteoporosis, Adult Osteoporosis is a condition that causes the bones to get weaker. With osteoporosis, the bones become thinner, and the normal spaces in bone tissue become larger. This can  make the bones weak and cause them to break more easily. People who have osteoporosis are more likely to break their wrist, spine, or hip. Even a minor accident or injury can be enough to break weak bones. Osteoporosis can occur with aging. Your body constantly replaces old bone tissue with new tissue. As you get older, you may lose bone tissue more quickly, or it may be replaced more slowly. Osteoporosis is more likely to develop if you have poor nutrition or do not get enough calcium or vitamin D. Other lifestyle factors can also play a role. By making some diet and lifestyle changes, you can help to keep your bones healthy and help to prevent osteoporosis. What nutrition changes can be made?  Nutrition plays an important role in maintaining healthy, strong bones.  Make sure you get enough calcium every day from food or from calcium supplements. ? If you are age 41 or younger, aim to get 1,000 mg of calcium every day. ? If you are older than age 24, aim to get 1,200 mg of calcium every day.  Try to get enough vitamin D every day. ? If you are age 57 or younger, aim to get 600 international units (IU) every day. ? If you are older than age 23, aim to get 800 international units every day.  Follow a healthy  diet. Eat plenty of foods that contain calcium and vitamin D. ? Calcium is in milk, cheese, yogurt, and other dairy products. Some fish and vegetables are also good sources of calcium. Many foods such as cereals and breads have had calcium added to them (are fortified). Check nutrition labels to see how much calcium is in a food or drink. ? Foods that contain vitamin D include milk, cereals, salmon, and tuna. Your body also makes vitamin D when you are out in the sun. Bare skin exposure to the sun on your face, arms, legs, or back for no more than 30 minutes a day, 2 times per week is more than enough. Beyond that, it is important to use sunblock to protect your skin from sunburn, which increases  your risk for skin cancer.  What lifestyle changes can be made?  Making changes in your everyday life can also play an important role in preventing osteoporosis.  Stay active and get exercise every day. Ask your health care provider what types of exercise are best for you.  Do not use any products that contain nicotine or tobacco, such as cigarettes and e-cigarettes. If you need help quitting, ask your health care provider.  Limit alcohol intake to no more than 1 drink a day for nonpregnant women and 2 drinks a day for men. One drink equals 12 oz of beer, 5 oz of wine, or 1 oz of hard liquor.  Why are these changes important? Making these nutrition and lifestyle changes can:  Help you develop and maintain healthy, strong bones.  Prevent loss of bone mass and the problems that are caused by that loss, such as broken bones and delayed healing.  Make you feel better mentally and physically.  What can happen if changes are not made? Problems that can result from osteoporosis can be very serious. These may include:  A higher risk of broken bones that are painful and do not heal well.  Physical malformations, such as a collapsed spine or a hunched back.  Problems with movement.  Where to find support: If you need help making changes to prevent osteoporosis, talk with your health care provider. You can ask for a referral to a diet and nutrition specialist (dietitian) and a physical therapist. Where to find more information: Learn more about osteoporosis from:  NIH Osteoporosis and Related [place]: www.niams.GolfingGoddess.com.br  U.S. Office on Women's Health: SouvenirBaseball.es.html  National Osteoporosis Foundation: ProfilePeek.ch  Summary  Osteoporosis is a condition that causes weak bones that are more likely to  break.  Eating a healthy diet and making sure you get enough calcium and vitamin D can help prevent osteoporosis.  Other ways to reduce your risk of osteoporosis include getting regular exercise and avoiding alcohol and products that contain nicotine or tobacco. This information is not intended to replace advice given to you by your health care provider. Make sure you discuss any questions you have with your health care provider. Document Released: 04/15/2015 Document Revised: 12/10/2015 Document Reviewed: 12/10/2015 Elsevier Interactive Patient Education  2018 [place] / Brain [employer]og  Here are four tips for improving your ability to tune out distractions and focus on what you are doing.  These are from [facility]    1. Exercise regularly      According to the [facility] , exercise can improve your mental clarity both directly and indirectly. Exercise reduces insulin resistance and inflammation, while stimulating the release of growth  factors - chemicals in the brain that affect the health of brain cells and the growth of new blood vessels in the brain. Indirectly, exercise improves mood and sleep and reduces stress and anxiety . Problems in these areas frequently cause or contribute to mental fogginess, or an inability to concentrate.      2.  Begin a mindfulness practice      Studies looking at how the brain changes before and after meditation have found that brain structures involved in awareness, attention, and self-related thinking changed in structure and increased in volume after meditation. Research has also shown that after eight weeks of meditation training, the hippocampus area of the brain, which is involved in learning and memory, developed more gray matter density. Very literally, mindfulness practices increase your brain's ability to focus on the task at hand.      3. Change how you think about multitasking      Many studies have squashed the  idea that multitasking increases productivity. Still, chances are high that you will be tempted to multitask many times throughout any given day. To improve your focus, be deliberate about when you are multitasking and when you are not. Pressuring yourself to either focus all the time or multitask all the time is not realistic (or fun). Plan your schedule so you have some of each in your day.    4. Identify your peak time of day      For most people, there is one time of day (morning, afternoon, or evening) when they are most productive. To improve your focus, examine which time of day is your personal peak time, and use that information to your advantage . For example, if you are a morning person, block off two hours of uninterrupted work time to focus on critical projects first thing in the morning, and then catch up on meetings and email later in the day.  There are many other resources on-line.  Headspace has a website https://www.headspace.com and an app with wonderful tips and reminders.

## 2018-08-10 ENCOUNTER — Encounter: Payer: Self-pay | Admitting: Adult Health Nurse Practitioner

## 2018-12-24 ENCOUNTER — Encounter: Payer: Self-pay | Admitting: Internal Medicine

## 2019-01-19 ENCOUNTER — Other Ambulatory Visit: Payer: Self-pay

## 2019-01-19 ENCOUNTER — Encounter (HOSPITAL_COMMUNITY): Payer: Self-pay | Admitting: *Deleted

## 2019-01-19 DIAGNOSIS — M25511 Pain in right shoulder: Secondary | ICD-10-CM | POA: Diagnosis not present

## 2019-01-19 DIAGNOSIS — S42211A Unspecified displaced fracture of surgical neck of right humerus, initial encounter for closed fracture: Secondary | ICD-10-CM | POA: Diagnosis not present

## 2019-01-20 ENCOUNTER — Other Ambulatory Visit: Payer: Self-pay

## 2019-01-20 ENCOUNTER — Ambulatory Visit (HOSPITAL_COMMUNITY): Payer: PPO

## 2019-01-20 ENCOUNTER — Encounter (HOSPITAL_COMMUNITY)
Admission: RE | Disposition: A | Discharge status: Home / Self Care | Payer: Self-pay | Source: Home / Self Care | Attending: Orthopedic Surgery

## 2019-01-20 ENCOUNTER — Encounter (HOSPITAL_COMMUNITY): Payer: Self-pay | Admitting: *Deleted

## 2019-01-20 ENCOUNTER — Ambulatory Visit (HOSPITAL_COMMUNITY): Payer: PPO | Admitting: Anesthesiology

## 2019-01-20 ENCOUNTER — Other Ambulatory Visit (HOSPITAL_COMMUNITY)
Admission: RE | Admit: 2019-01-20 | Discharge: 2019-01-20 | Disposition: A | Discharge status: Home / Self Care | Payer: PPO | Source: Ambulatory Visit | Attending: Orthopedic Surgery | Admitting: Orthopedic Surgery

## 2019-01-20 ENCOUNTER — Ambulatory Visit (HOSPITAL_COMMUNITY)
Admission: RE | Admit: 2019-01-20 | Discharge: 2019-01-21 | Disposition: A | Discharge status: Home / Self Care | Payer: PPO | Attending: Orthopedic Surgery | Admitting: Orthopedic Surgery

## 2019-01-20 DIAGNOSIS — Z79899 Other long term (current) drug therapy: Secondary | ICD-10-CM | POA: Diagnosis not present

## 2019-01-20 DIAGNOSIS — M25511 Pain in right shoulder: Secondary | ICD-10-CM | POA: Diagnosis not present

## 2019-01-20 DIAGNOSIS — Z806 Family history of leukemia: Secondary | ICD-10-CM | POA: Insufficient documentation

## 2019-01-20 DIAGNOSIS — S42221A 2-part displaced fracture of surgical neck of right humerus, initial encounter for closed fracture: Secondary | ICD-10-CM | POA: Insufficient documentation

## 2019-01-20 DIAGNOSIS — Z8349 Family history of other endocrine, nutritional and metabolic diseases: Secondary | ICD-10-CM | POA: Insufficient documentation

## 2019-01-20 DIAGNOSIS — Z823 Family history of stroke: Secondary | ICD-10-CM | POA: Insufficient documentation

## 2019-01-20 DIAGNOSIS — Z20828 Contact with and (suspected) exposure to other viral communicable diseases: Secondary | ICD-10-CM | POA: Insufficient documentation

## 2019-01-20 DIAGNOSIS — I10 Essential (primary) hypertension: Secondary | ICD-10-CM | POA: Diagnosis not present

## 2019-01-20 DIAGNOSIS — S42209A Unspecified fracture of upper end of unspecified humerus, initial encounter for closed fracture: Secondary | ICD-10-CM | POA: Diagnosis present

## 2019-01-20 DIAGNOSIS — Z809 Family history of malignant neoplasm, unspecified: Secondary | ICD-10-CM | POA: Diagnosis not present

## 2019-01-20 DIAGNOSIS — Z419 Encounter for procedure for purposes other than remedying health state, unspecified: Secondary | ICD-10-CM

## 2019-01-20 DIAGNOSIS — W1830XA Fall on same level, unspecified, initial encounter: Secondary | ICD-10-CM | POA: Insufficient documentation

## 2019-01-20 DIAGNOSIS — Z8249 Family history of ischemic heart disease and other diseases of the circulatory system: Secondary | ICD-10-CM | POA: Insufficient documentation

## 2019-01-20 DIAGNOSIS — E559 Vitamin D deficiency, unspecified: Secondary | ICD-10-CM | POA: Diagnosis not present

## 2019-01-20 DIAGNOSIS — S42201A Unspecified fracture of upper end of right humerus, initial encounter for closed fracture: Secondary | ICD-10-CM | POA: Diagnosis not present

## 2019-01-20 DIAGNOSIS — E782 Mixed hyperlipidemia: Secondary | ICD-10-CM | POA: Diagnosis not present

## 2019-01-20 DIAGNOSIS — S42291A Other displaced fracture of upper end of right humerus, initial encounter for closed fracture: Secondary | ICD-10-CM | POA: Diagnosis not present

## 2019-01-20 HISTORY — DX: Tachycardia, unspecified: R00.0

## 2019-01-20 HISTORY — DX: Unspecified fracture of upper end of unspecified humerus, initial encounter for closed fracture: S42.209A

## 2019-01-20 HISTORY — PX: ORIF HUMERUS FRACTURE: SHX2126

## 2019-01-20 LAB — CBC
HCT: 36.7 % (ref 36.0–46.0)
Hemoglobin: 12 g/dL (ref 12.0–15.0)
MCH: 32.4 pg (ref 26.0–34.0)
MCHC: 32.7 g/dL (ref 30.0–36.0)
MCV: 99.2 fL (ref 80.0–100.0)
Platelets: 155 10*3/uL (ref 150–400)
RBC: 3.7 MIL/uL — ABNORMAL LOW (ref 3.87–5.11)
RDW: 11.4 % — ABNORMAL LOW (ref 11.5–15.5)
WBC: 5.6 10*3/uL (ref 4.0–10.5)
nRBC: 0 % (ref 0.0–0.2)

## 2019-01-20 LAB — SARS CORONAVIRUS 2 BY RT PCR (HOSPITAL ORDER, PERFORMED IN ~~LOC~~ HOSPITAL LAB): SARS Coronavirus 2: NEGATIVE

## 2019-01-20 SURGERY — OPEN REDUCTION INTERNAL FIXATION (ORIF) PROXIMAL HUMERUS FRACTURE
Anesthesia: General | Laterality: Right

## 2019-01-20 MED ORDER — ONDANSETRON HCL 4 MG/2ML IJ SOLN
INTRAMUSCULAR | Status: DC | PRN
  Administered 2019-01-20: 4 mg via INTRAVENOUS

## 2019-01-20 MED ORDER — METHOCARBAMOL 500 MG PO TABS
500.0000 mg | ORAL_TABLET | Freq: Four times a day (QID) | ORAL | Status: DC | PRN
  Administered 2019-01-21: 500 mg via ORAL
  Filled 2019-01-20: qty 1

## 2019-01-20 MED ORDER — SUGAMMADEX SODIUM 200 MG/2ML IV SOLN
INTRAVENOUS | Status: DC | PRN
  Administered 2019-01-20: 200 mg via INTRAVENOUS

## 2019-01-20 MED ORDER — HYDROMORPHONE HCL 1 MG/ML IJ SOLN: 0.5000 mg | INTRAMUSCULAR | Status: DC | PRN

## 2019-01-20 MED ORDER — TEMAZEPAM 15 MG PO CAPS: 15.0000 mg | ORAL_CAPSULE | Freq: Every evening | ORAL | Status: DC | PRN

## 2019-01-20 MED ORDER — DEXAMETHASONE SODIUM PHOSPHATE 10 MG/ML IJ SOLN
INTRAMUSCULAR | Status: AC
  Filled 2019-01-20: qty 1

## 2019-01-20 MED ORDER — FENTANYL CITRATE (PF) 100 MCG/2ML IJ SOLN
INTRAMUSCULAR | Status: DC | PRN
  Administered 2019-01-20: 50 ug via INTRAVENOUS

## 2019-01-20 MED ORDER — OXYCODONE HCL 5 MG/5ML PO SOLN: 5.0000 mg | Freq: Once | ORAL | Status: DC | PRN

## 2019-01-20 MED ORDER — POLYETHYLENE GLYCOL 3350 17 G PO PACK: 17.0000 g | PACK | Freq: Every day | ORAL | Status: DC | PRN

## 2019-01-20 MED ORDER — PHENYLEPHRINE 40 MCG/ML (10ML) SYRINGE FOR IV PUSH (FOR BLOOD PRESSURE SUPPORT)
PREFILLED_SYRINGE | INTRAVENOUS | Status: DC | PRN
  Administered 2019-01-20: 120 ug via INTRAVENOUS
  Administered 2019-01-20: 80 ug via INTRAVENOUS
  Administered 2019-01-20: 120 ug via INTRAVENOUS

## 2019-01-20 MED ORDER — LACTATED RINGERS IV SOLN
INTRAVENOUS | Status: DC
  Administered 2019-01-20: 12:00:00 via INTRAVENOUS

## 2019-01-20 MED ORDER — SODIUM CHLORIDE 0.9 % IV SOLN
INTRAVENOUS | Status: DC | PRN
  Administered 2019-01-20: 25 ug/min via INTRAVENOUS

## 2019-01-20 MED ORDER — ONDANSETRON HCL 4 MG PO TABS: 4.0000 mg | ORAL_TABLET | Freq: Four times a day (QID) | ORAL | Status: DC | PRN

## 2019-01-20 MED ORDER — OXYCODONE HCL 5 MG PO TABS: 5.0000 mg | ORAL_TABLET | Freq: Once | ORAL | Status: DC | PRN

## 2019-01-20 MED ORDER — OXYCODONE HCL 5 MG PO TABS: 10.0000 mg | ORAL_TABLET | ORAL | Status: DC | PRN

## 2019-01-20 MED ORDER — METOCLOPRAMIDE HCL 5 MG PO TABS: 5.0000 mg | ORAL_TABLET | Freq: Three times a day (TID) | ORAL | Status: DC | PRN

## 2019-01-20 MED ORDER — ONDANSETRON HCL 4 MG/2ML IJ SOLN
INTRAMUSCULAR | Status: AC
  Filled 2019-01-20: qty 2

## 2019-01-20 MED ORDER — PHENOL 1.4 % MT LIQD
1.0000 | OROMUCOSAL | Status: DC | PRN
  Filled 2019-01-20: qty 177

## 2019-01-20 MED ORDER — EPHEDRINE SULFATE-NACL 50-0.9 MG/10ML-% IV SOSY
PREFILLED_SYRINGE | INTRAVENOUS | Status: DC | PRN
  Administered 2019-01-20: 5 mg via INTRAVENOUS

## 2019-01-20 MED ORDER — ONDANSETRON HCL 4 MG/2ML IJ SOLN: 4.0000 mg | Freq: Four times a day (QID) | INTRAMUSCULAR | Status: DC | PRN

## 2019-01-20 MED ORDER — CHLORHEXIDINE GLUCONATE 4 % EX LIQD
60.0000 mL | Freq: Once | CUTANEOUS | Status: AC
  Administered 2019-01-20: 4 via TOPICAL

## 2019-01-20 MED ORDER — PANTOPRAZOLE SODIUM 40 MG PO TBEC
40.0000 mg | DELAYED_RELEASE_TABLET | Freq: Every day | ORAL | Status: DC
  Administered 2019-01-21: 08:00:00 40 mg via ORAL
  Filled 2019-01-20: qty 1

## 2019-01-20 MED ORDER — ACETAMINOPHEN 325 MG PO TABS
325.0000 mg | ORAL_TABLET | Freq: Four times a day (QID) | ORAL | Status: DC | PRN
  Administered 2019-01-21: 650 mg via ORAL
  Filled 2019-01-20: qty 2

## 2019-01-20 MED ORDER — ROCURONIUM BROMIDE 10 MG/ML (PF) SYRINGE
PREFILLED_SYRINGE | INTRAVENOUS | Status: AC
  Filled 2019-01-20: qty 10

## 2019-01-20 MED ORDER — TRANEXAMIC ACID-NACL 1000-0.7 MG/100ML-% IV SOLN
1000.0000 mg | INTRAVENOUS | Status: AC
  Administered 2019-01-20: 1000 mg via INTRAVENOUS
  Filled 2019-01-20: qty 100

## 2019-01-20 MED ORDER — METOCLOPRAMIDE HCL 5 MG/ML IJ SOLN: 5.0000 mg | Freq: Three times a day (TID) | INTRAMUSCULAR | Status: DC | PRN

## 2019-01-20 MED ORDER — FENTANYL CITRATE (PF) 100 MCG/2ML IJ SOLN
50.0000 ug | INTRAMUSCULAR | Status: AC
  Administered 2019-01-20: 12:00:00 50 ug via INTRAVENOUS
  Filled 2019-01-20: qty 2

## 2019-01-20 MED ORDER — FENTANYL CITRATE (PF) 100 MCG/2ML IJ SOLN: 25.0000 ug | INTRAMUSCULAR | Status: DC | PRN

## 2019-01-20 MED ORDER — METHOCARBAMOL 500 MG IVPB - SIMPLE MED
500.0000 mg | Freq: Four times a day (QID) | INTRAVENOUS | Status: DC | PRN
  Filled 2019-01-20: qty 50

## 2019-01-20 MED ORDER — ROCURONIUM BROMIDE 10 MG/ML (PF) SYRINGE
PREFILLED_SYRINGE | INTRAVENOUS | Status: DC | PRN
  Administered 2019-01-20: 50 mg via INTRAVENOUS

## 2019-01-20 MED ORDER — PHENYLEPHRINE HCL (PRESSORS) 10 MG/ML IV SOLN
INTRAVENOUS | Status: AC
  Filled 2019-01-20: qty 1

## 2019-01-20 MED ORDER — MENTHOL 3 MG MT LOZG: 1.0000 | LOZENGE | OROMUCOSAL | Status: DC | PRN

## 2019-01-20 MED ORDER — FENTANYL CITRATE (PF) 100 MCG/2ML IJ SOLN
INTRAMUSCULAR | Status: AC
  Filled 2019-01-20: qty 2

## 2019-01-20 MED ORDER — CEFAZOLIN SODIUM-DEXTROSE 2-4 GM/100ML-% IV SOLN
2.0000 g | INTRAVENOUS | Status: AC
  Administered 2019-01-20: 2 g via INTRAVENOUS
  Filled 2019-01-20: qty 100

## 2019-01-20 MED ORDER — PROPOFOL 10 MG/ML IV BOLUS
INTRAVENOUS | Status: AC
  Filled 2019-01-20: qty 20

## 2019-01-20 MED ORDER — PROPOFOL 10 MG/ML IV BOLUS
INTRAVENOUS | Status: DC | PRN
  Administered 2019-01-20: 100 mg via INTRAVENOUS

## 2019-01-20 MED ORDER — DIPHENHYDRAMINE HCL 12.5 MG/5ML PO ELIX: 12.5000 mg | ORAL_SOLUTION | ORAL | Status: DC | PRN

## 2019-01-20 MED ORDER — DOCUSATE SODIUM 100 MG PO CAPS
100.0000 mg | ORAL_CAPSULE | Freq: Two times a day (BID) | ORAL | Status: DC
  Administered 2019-01-20 – 2019-01-21 (×2): 100 mg via ORAL
  Filled 2019-01-20 (×2): qty 1

## 2019-01-20 MED ORDER — MIDAZOLAM HCL 2 MG/2ML IJ SOLN
1.0000 mg | INTRAMUSCULAR | Status: AC
  Administered 2019-01-20: 1 mg via INTRAVENOUS
  Filled 2019-01-20: qty 2

## 2019-01-20 MED ORDER — DEXAMETHASONE SODIUM PHOSPHATE 10 MG/ML IJ SOLN
INTRAMUSCULAR | Status: DC | PRN
  Administered 2019-01-20: 4 mg via INTRAVENOUS

## 2019-01-20 MED ORDER — OXYCODONE HCL 5 MG PO TABS
5.0000 mg | ORAL_TABLET | ORAL | Status: DC | PRN
  Administered 2019-01-21: 5 mg via ORAL
  Filled 2019-01-20: qty 1

## 2019-01-20 MED ORDER — LIDOCAINE 2% (20 MG/ML) 5 ML SYRINGE
INTRAMUSCULAR | Status: DC | PRN
  Administered 2019-01-20: 40 mg via INTRAVENOUS

## 2019-01-20 MED ORDER — LIDOCAINE 2% (20 MG/ML) 5 ML SYRINGE
INTRAMUSCULAR | Status: AC
  Filled 2019-01-20: qty 5

## 2019-01-20 MED ORDER — MAGNESIUM CITRATE PO SOLN: 1.0000 | Freq: Once | ORAL | Status: DC | PRN

## 2019-01-20 MED ORDER — SODIUM CHLORIDE 0.9 % IR SOLN
Status: DC | PRN
  Administered 2019-01-20: 1000 mL

## 2019-01-20 MED ORDER — BISACODYL 5 MG PO TBEC: 5.0000 mg | DELAYED_RELEASE_TABLET | Freq: Every day | ORAL | Status: DC | PRN

## 2019-01-20 MED ORDER — LACTATED RINGERS IV SOLN: INTRAVENOUS | Status: DC

## 2019-01-20 SURGICAL SUPPLY — 64 items
ADH SKN CLS APL DERMABOND .7 (GAUZE/BANDAGES/DRESSINGS) ×1
AID PSTN UNV HD RSTRNT DISP (MISCELLANEOUS) ×1
BAG SPEC THK2 15X12 ZIP CLS (MISCELLANEOUS) ×1
BAG ZIPLOCK 12X15 (MISCELLANEOUS) ×2 IMPLANT
BIT DRILL 3.2 (BIT) ×2
BIT DRILL 3.2XCALB NS DISP (BIT) IMPLANT
BIT DRILL CALIBRATED 2.7 (BIT) ×1 IMPLANT
BIT DRL 3.2XCALB NS DISP (BIT) ×1
COOLER ICEMAN CLASSIC (MISCELLANEOUS) ×1 IMPLANT
COVER SURGICAL LIGHT HANDLE (MISCELLANEOUS) ×2 IMPLANT
COVER WAND RF STERILE (DRAPES) IMPLANT
DERMABOND ADVANCED (GAUZE/BANDAGES/DRESSINGS) ×1
DERMABOND ADVANCED .7 DNX12 (GAUZE/BANDAGES/DRESSINGS) ×1 IMPLANT
DRAPE C-ARM 42X120 X-RAY (DRAPES) ×2 IMPLANT
DRAPE C-ARMOR (DRAPES) IMPLANT
DRAPE ORTHO SPLIT 77X108 STRL (DRAPES) ×4
DRAPE SURG ORHT 6 SPLT 77X108 (DRAPES) ×2 IMPLANT
DRAPE U-SHAPE 47X51 STRL (DRAPES) ×2 IMPLANT
DRSG AQUACEL AG ADV 3.5X 6 (GAUZE/BANDAGES/DRESSINGS) ×1 IMPLANT
DRSG AQUACEL AG ADV 3.5X10 (GAUZE/BANDAGES/DRESSINGS) IMPLANT
DURAPREP 26ML APPLICATOR (WOUND CARE) ×2 IMPLANT
ELECT REM PT RETURN 15FT ADLT (MISCELLANEOUS) ×2 IMPLANT
GLOVE BIO SURGEON STRL SZ7.5 (GLOVE) ×2 IMPLANT
GLOVE BIO SURGEON STRL SZ8 (GLOVE) ×2 IMPLANT
GLOVE SS BIOGEL STRL SZ 7 (GLOVE) ×1 IMPLANT
GLOVE SS BIOGEL STRL SZ 7.5 (GLOVE) ×1 IMPLANT
GLOVE SUPERSENSE BIOGEL SZ 7 (GLOVE) ×1
GLOVE SUPERSENSE BIOGEL SZ 7.5 (GLOVE) ×1
GOWN STRL REUS W/TWL LRG LVL3 (GOWN DISPOSABLE) ×4 IMPLANT
K-WIRE 2X5 SS THRDED S3 (WIRE) ×4
KIT BASIN OR (CUSTOM PROCEDURE TRAY) ×2 IMPLANT
KIT TURNOVER KIT A (KITS) IMPLANT
KWIRE 2X5 SS THRDED S3 (WIRE) IMPLANT
MANIFOLD NEPTUNE II (INSTRUMENTS) ×2 IMPLANT
NDL TAPERED W/ NITINOL LOOP (MISCELLANEOUS) IMPLANT
NEEDLE TAPERED W/ NITINOL LOOP (MISCELLANEOUS) IMPLANT
NS IRRIG 1000ML POUR BTL (IV SOLUTION) ×2 IMPLANT
PACK SHOULDER (CUSTOM PROCEDURE TRAY) ×2 IMPLANT
PAD COLD SHLDR WRAP-ON (PAD) ×1 IMPLANT
PEG LOCKING 3.2X 28MM (Peg) ×1 IMPLANT
PEG LOCKING 3.2X32 (Peg) ×2 IMPLANT
PEG LOCKING 3.2X36 (Screw) ×2 IMPLANT
PEG LOCKING 3.2X38 (Screw) ×1 IMPLANT
PEG LOCKING 3.2X40 (Peg) ×1 IMPLANT
PEG LOCKING 3.2X48 (Peg) ×1 IMPLANT
PLATE PROX HUM HI R 3H 80 (Plate) ×1 IMPLANT
PROTECTOR NERVE ULNAR (MISCELLANEOUS) ×2 IMPLANT
PUTTY DBM STAGRAFT PLUS 10CC (Putty) ×1 IMPLANT
RESTRAINT HEAD UNIVERSAL NS (MISCELLANEOUS) ×2 IMPLANT
SCREW LOCK CORT STAR 3.5X22 (Screw) ×1 IMPLANT
SCREW T15 MD 3.5X20MM NS (Screw) ×1 IMPLANT
SCREW T15 MD 3.5X24MM NS (Screw) ×1 IMPLANT
SLING ARM FOAM STRAP LRG (SOFTGOODS) IMPLANT
SLING ARM FOAM STRAP MED (SOFTGOODS) ×1 IMPLANT
SLING ARM FOAM STRAP SML (SOFTGOODS) IMPLANT
SUCTION FRAZIER HANDLE 12FR (TUBING) ×1
SUCTION TUBE FRAZIER 12FR DISP (TUBING) ×1 IMPLANT
SUT FIBERWIRE #2 38 T-5 BLUE (SUTURE)
SUT MNCRL AB 3-0 PS2 18 (SUTURE) ×2 IMPLANT
SUT MON AB 2-0 CT1 36 (SUTURE) ×2 IMPLANT
SUT VIC AB 1 CT1 36 (SUTURE) ×2 IMPLANT
SUTURE FIBERWR #2 38 T-5 BLUE (SUTURE) IMPLANT
TOWEL OR 17X26 10 PK STRL BLUE (TOWEL DISPOSABLE) ×2 IMPLANT
TOWEL OR NON WOVEN STRL DISP B (DISPOSABLE) ×2 IMPLANT

## 2019-01-20 NOTE — Progress Notes (Signed)
Assisted Dr. Hodierne with right, ultrasound guided, interscalene  block. Side rails up, monitors on throughout procedure. See vital signs in flow sheet. Tolerated Procedure well. 

## 2019-01-20 NOTE — H&P (Signed)
Tricia [name]    Chief Complaint: Right proximal humerus fracture HPI: The patient is a 73 y.o. female status post ground level fall with severely angulated right 2 part proximal humerus fracture  Past Medical History:  Diagnosis Date  . Tachycardia    Related to Epinephrine used for dental procedure    Past Surgical History:  Procedure Laterality Date  . colonscopy    . OVARIAN CYST REMOVAL  1973    Family History  Problem Relation Age of Onset  . Leukemia Mother   . Goiter Mother   . Stroke Father   . Cancer Father   . Heart attack Father     Social History:  reports that she has never smoked. She has never used smokeless tobacco. She reports that she does not drink alcohol or use drugs.   Medications Prior to Admission  Medication Sig Dispense Refill  . Ascorbic Acid (VITAMIN C PO) Take 1 tablet by mouth daily.     . B Complex Vitamins (VITAMIN B COMPLEX PO) Take by mouth.    . Cholecalciferol (VITAMIN D PO) Take 5,000 Units by mouth daily.     . Multiple Vitamins-Minerals (MULTIVITAMIN PO) Take 1 tablet by mouth daily.     . Omega-3 Fatty Acids (OMEGA 3 PO) Take 500 mg by mouth 2 (two) times daily.    Marland Kitchen OVER THE COUNTER MEDICATION Iodine 7 drops daily    . OVER THE COUNTER MEDICATION thytrophen  3 per day.    Marland Kitchen OVER THE COUNTER MEDICATION OsteoSheath 1224 mg daily.    Marland Kitchen OVER THE COUNTER MEDICATION     . OVER THE COUNTER MEDICATION     . oxyCODONE-acetaminophen (PERCOCET/ROXICET) 5-325 MG tablet Take 1 tablet by mouth every 6 (six) hours as needed for severe pain.    . Probiotic Product (PROBIOTIC DAILY PO) Take 1 tablet by mouth daily.     . Pyridoxine HCl (VITAMIN B-6) 500 MG tablet Take 500 mg by mouth daily.       Physical Exam: Patient is a slender, well-developed female in obvious discomfort secondary to right shoulder pain.  On inspection of the right shoulder there is noted to be diffuse swelling.  She is intact light touch sensation in the axillary nerve  distribution and is neurovascular intact distally in the right upper extremity.  Extreme pain with attempts at passive motion of the right shoulder.  No elbow forearm wrist or hand crepitance, instability, or tenderness.  Radiographs  3 view plain film x-rays of the right shoulder performed in our office yesterday confirmed a severely angulated 2 part proximal humerus fracture.  Vitals  Temp:  [98.5 F (36.9 C)] 98.5 F (36.9 C) (10/08 1148) Pulse Rate:  [57-72] 72 (10/08 1228) Resp:  [12-19] 14 (10/08 1228) BP: (101-114)/(46-52) 101/46 (10/08 1228) SpO2:  [100 %] 100 % (10/08 1228) Weight:  [45.4 kg] 45.4 kg (10/08 1209)  Assessment/Plan  Impression: Right proximal humerus fracture  Plan of Action: Procedure(s): OPEN REDUCTION INTERNAL FIXATION (ORIF) PROXIMAL HUMERUS FRACTURE  Tricia [name] M Tricia [name] 01/20/2019, 12:35 PM Contact # [phone]

## 2019-01-20 NOTE — Op Note (Signed)
01/20/2019  3:08 PM  PATIENT:   Tricia [name]  73 y.o. female  PRE-OPERATIVE DIAGNOSIS: Severely angulated right 2 part proximal humerus fracture  POST-OPERATIVE DIAGNOSIS: Same  PROCEDURE: Open reduction and internal fixation of severely angulated right 2 part proximal humerus fracture with the application of allograft bone graft to the fracture site  SURGEON:  Romell Wolden, Metta Clines M.D.  ASSISTANTS: Jenetta Loges, PA-C  ANESTHESIA:   General endotracheal and interscalene block with Exparel  EBL: 200 cc  SPECIMEN: None  Drains: None   PATIENT DISPOSITION:  PACU - hemodynamically stable.    PLAN OF CARE: Admit for overnight observation  Brief history:  Patient is a 73 year old female who had a ground level fall yesterday sustaining a severely angulated right 2 part proximal humerus fracture.  Due to the degree of angulation she is brought to the operating this time for planned open reduction and internal fixation.  Preoperatively I counseled Ms. McGinnis regarding treatment options as well as the potential risks versus benefits thereof.  Possible surgical complications were reviewed including bleeding, infection, neurovascular injury, persistent pain, loss of motion, anesthetic complication, failure of fixation, malunion, nonunion, anesthetic complication, and possible need for additional surgery.  She understands, and accepts, and agrees with her planned procedure.  Procedure in detail:  After undergoing routine preop evaluation patient received prophylactic antibiotics and interscalene block with Exparel was established in the holding area by the anesthesia department.  Placed supine on the operating table and underwent the smooth induction of a general endotracheal anesthesia.  Placed into the beachchair position and appropriate padding protected.  Fluoroscopic imaging was then used to confirm proper visualization of the proximal humerus and the right shoulder girdle region was  then sterilely prepped and draped in standard fashion.  Timeout was called.  An anterior deltopectoral approach to the right shoulder is made through an 8 cm incision.  Skin flaps were elevated dissection was carried deeply deltopectoral interval was then developed from proximal to distal and adhesions beneath the deltoid were divided.  We identified the long head biceps tendon and then just posterior to this we identified the fracture site through which we introduced a Crago elevator and manipulated the fracture site into overall improved alignment and then provisionally fastened a 3-hole proximal humeral Biomet plate and held this in position with a bone clamp and then obtained fluoroscopic imaging at 90 degrees orthogonal views to confirm the overall position alignment was to our satisfaction.  Once provisional reduction had been achieved a guidepin was then centered through our plate up into the center of the humeral head and once we were satisfied with the overall positioning and alignment we went ahead and completed fixation with multiple pegs proximally and 3 bicortical screws distally through the plate 1 of which was a locking screw distally.  Final position was then confirmed with fluoroscopic imaging that hardware was in good position and good position of the fracture site.  This point through a small cortical window anterior to our plate we introduced approximately 8 cc of stay graft allograft bone graft material filling the metaphyseal void nicely.  At this point final inspection and irrigation was then completed.  Hemostasis was obtained.  The deltopectoral interval was reapproximated with a series of figure-of-eight #1 Vicryl sutures.  2-0 Vicryl used for the subcu layer and intracuticular 3 Monocryl for the skin followed by Dermabond and Aquasol dressing right arm was then placed into a sling and the patient was awakened, extubated, and taken to  the recovery in stable condition.  Jenetta Loges, PA-C  was used as an Environmental consultant throughout this case essential for help with positioning the patient, position extremity, manipulation of the fracture site, wound closure, and intraoperative decision making.  Metta Clines Allenmichael Mcpartlin MD   Contact # [phone]

## 2019-01-20 NOTE — Anesthesia Procedure Notes (Signed)
Procedure Name: Intubation Date/Time: 01/20/2019 1:33 PM Performed by: Niel Hummer, CRNA Pre-anesthesia Checklist: Patient identified, Emergency Drugs available, Suction available and Patient being monitored Patient Re-evaluated:Patient Re-evaluated prior to induction Oxygen Delivery Method: Circle system utilized Preoxygenation: Pre-oxygenation with 100% oxygen Induction Type: IV induction Ventilation: Mask ventilation without difficulty Laryngoscope Size: Mac and 4 Grade View: Grade I Tube type: Oral Tube size: 7.0 mm Number of attempts: 1 Airway Equipment and Method: Stylet Placement Confirmation: ETT inserted through vocal cords under direct vision,  positive ETCO2 and breath sounds checked- equal and bilateral Secured at: 22 cm Tube secured with: Tape Dental Injury: Teeth and Oropharynx as per pre-operative assessment

## 2019-01-20 NOTE — Plan of Care (Signed)
Plan of care for post op day 0 discussed with patient.  

## 2019-01-20 NOTE — Anesthesia Postprocedure Evaluation (Signed)
Anesthesia Post Note  Patient: ADRI MELCHOR  Procedure(s) Performed: OPEN REDUCTION INTERNAL FIXATION (ORIF) PROXIMAL HUMERUS FRACTURE (Right )     Patient location during evaluation: PACU Anesthesia Type: General Level of consciousness: awake and alert Pain management: pain level controlled Vital Signs Assessment: post-procedure vital signs reviewed and stable Respiratory status: spontaneous breathing, nonlabored ventilation, respiratory function stable and patient connected to nasal cannula oxygen Cardiovascular status: blood pressure returned to baseline and stable Postop Assessment: no apparent nausea or vomiting Anesthetic complications: no    Last Vitals:  Vitals:   01/20/19 1615 01/20/19 1641  BP: (!) 112/98 (!) 123/56  Pulse: 65 (!) 59  Resp: 17 16  Temp: 36.4 C 36.5 C  SpO2: 100% 100%    Last Pain:  Vitals:   01/20/19 1641  TempSrc: Oral  PainSc:                  Cordaro Mukai

## 2019-01-20 NOTE — Transfer of Care (Signed)
Immediate Anesthesia Transfer of Care Note  Patient: ANASTASSIA SOENS  Procedure(s) Performed: OPEN REDUCTION INTERNAL FIXATION (ORIF) PROXIMAL HUMERUS FRACTURE (Right )  Patient Location: PACU  Anesthesia Type:General  Level of Consciousness: awake, alert  and oriented  Airway & Oxygen Therapy: Patient Spontanous Breathing and Patient connected to nasal cannula oxygen  Post-op Assessment: Report given to RN and Post -op Vital signs reviewed and stable  Post vital signs: Reviewed and stable  Last Vitals:  Vitals Value Taken Time  BP    Temp    Pulse 74 01/20/19 1535  Resp    SpO2 100 % 01/20/19 1535  Vitals shown include unvalidated device data.  Last Pain:  Vitals:   01/20/19 1233  TempSrc:   PainSc: 0-No pain         Complications: No apparent anesthesia complications

## 2019-01-20 NOTE — Anesthesia Preprocedure Evaluation (Signed)
Anesthesia Evaluation  Patient identified by MRN, date of birth, ID band Patient awake    Reviewed: Allergy & Precautions, H&P , NPO status , Patient's Chart, lab work & pertinent test results  Airway Mallampati: II   Neck ROM: full    Dental   Pulmonary neg pulmonary ROS,    breath sounds clear to auscultation       Cardiovascular hypertension,  Rhythm:regular Rate:Normal     Neuro/Psych    GI/Hepatic   Endo/Other    Renal/GU      Musculoskeletal   Abdominal   Peds  Hematology   Anesthesia Other Findings   Reproductive/Obstetrics                             Anesthesia Physical Anesthesia Plan  ASA: II  Anesthesia Plan: General   Post-op Pain Management:  Regional for Post-op pain   Induction: Intravenous  PONV Risk Score and Plan: 3 and Ondansetron, Dexamethasone, Midazolam and Treatment may vary due to age or medical condition  Airway Management Planned: Oral ETT  Additional Equipment:   Intra-op Plan:   Post-operative Plan: Extubation in OR  Informed Consent: I have reviewed the patients History and Physical, chart, labs and discussed the procedure including the risks, benefits and alternatives for the proposed anesthesia with the patient or authorized representative who has indicated his/her understanding and acceptance.       Plan Discussed with: CRNA, Anesthesiologist and Surgeon  Anesthesia Plan Comments:         Anesthesia Quick Evaluation

## 2019-01-21 ENCOUNTER — Encounter (HOSPITAL_COMMUNITY): Payer: Self-pay | Admitting: Orthopedic Surgery

## 2019-01-21 DIAGNOSIS — S42221A 2-part displaced fracture of surgical neck of right humerus, initial encounter for closed fracture: Secondary | ICD-10-CM | POA: Diagnosis not present

## 2019-01-21 MED ORDER — METHOCARBAMOL 500 MG PO TABS: 500.0000 mg | ORAL_TABLET | Freq: Three times a day (TID) | ORAL | 1 refills | Status: DC | PRN

## 2019-01-21 MED ORDER — ONDANSETRON HCL 4 MG PO TABS: 4.0000 mg | ORAL_TABLET | Freq: Three times a day (TID) | ORAL | 0 refills | Status: DC | PRN

## 2019-01-21 MED ORDER — OXYCODONE-ACETAMINOPHEN 5-325 MG PO TABS: 1.0000 | ORAL_TABLET | Freq: Four times a day (QID) | ORAL | 0 refills | Status: DC | PRN

## 2019-01-21 NOTE — Discharge Instructions (Signed)
Metta Clines. Supple, M.D., F.A.A.O.S. Orthopaedic Surgery Specializing in Arthroscopic and Reconstructive Surgery of the Shoulder [phone] [address] [place], [place] [address] - Fax [phone]   POST-OP SHOULDER SURGERY INSTRUCTIONS  1. Call the office at [phone] to schedule your first post-op appointment 10-14 days from the date of your surgery.  2. The bandage over your incision is waterproof. You may begin showering with this dressing on. You may leave this dressing on until first follow up appointment within 2 weeks. We prefer you leave this dressing in place until follow up however after 5-7 days if you are having itching or skin irritation and would like to remove it you may do so. Go slow and tug at the borders gently to break the bond the dressing has with the skin. At this point if there is no drainage it is okay to go without a bandage or you may cover it with a light guaze and tape. You can also expect significant bruising around your shoulder that will drift down your arm and into your chest wall. This is very normal and should resolve over several days.   3. Wear your sling/immobilizer at all times except to perform the exercises below or to occasionally let your arm dangle by your side to stretch your elbow. You also need to sleep in your sling immobilizer until instructed otherwise. It is ok to remove your sling if you are sitting in a controlled environment and allow your arm to rest in a position of comfort by your side or on your lap with pillows to give your neck and skin a break from the sling. You may remove it to allow arm to dangle by side to shower. If you are up walking around and when you go to sleep at night you need to wear it.  4. Range of motion to your elbow, wrist, and hand are encouraged 3-5 times daily. Exercise to your hand and fingers helps to reduce swelling you may experience.  5. Utilize ice to the shoulder 3-5 times minimum a day  and additionally if you are experiencing pain.  6. Prescriptions for a pain medication and a muscle relaxant are provided for you. It is recommended that if you are experiencing pain that you pain medication alone is not controlling, add the muscle relaxant along with the pain medication which can give additional pain relief. The first 1-2 days is generally the most severe of your pain and then should gradually decrease. As your pain lessens it is recommended that you decrease your use of the pain medications to an "as needed basis'" only and to always comply with the recommended dosages of the pain medications.  7. Pain medications can produce constipation along with their use. If you experience this, the use of an over the counter stool softener or laxative daily is recommended.   8. For additional questions or concerns, please do not hesitate to call the office. If after hours there is an answering service to forward your concerns to the physician on call.  9.Pain control following an exparel block  To help control your post-operative pain you received a nerve block  performed with Exparel which is a long acting anesthetic (numbing agent) which can provide pain relief and sensations of numbness (and relief of pain) in the operative shoulder and arm for up to 3 days. Sometimes it provides mixed relief, meaning you may still have numbness in certain areas of the arm but can still be  able to move  parts of that arm, hand, and fingers. We recommend that your prescribed pain medications  be used as needed. We do not feel it is necessary to "pre medicate" and "stay ahead" of pain.  Taking narcotic pain medications when you are not having any pain can lead to unnecessary and potentially dangerous side effects.    10. Use the ice machine as much as possible in the first 5-7 days from surgery, then you can wean its use to as needed. The ice typically needs to be replaced every 6 hours, instead of ice you can  actually freeze water bottles to put in the cooler and then fill water around them to avoid having to purchase ice. You can have spare water bottles freezing to allow you to rotate them once they have melted. Try to have a thin shirt or light cloth or towel under the ice wrap to protect your skin.   POST-OP EXERCISES  OK to allow arm to dangle and move elbow, wrist and hand

## 2019-01-21 NOTE — Plan of Care (Signed)
Patient discharged home in stable condition 

## 2019-01-21 NOTE — Evaluation (Signed)
Occupational Therapy Evaluation Patient Details Name: Tricia [name] MRN: [id number] DOB: Jul 11, 1945 Today's Date: 01/21/2019    History of Present Illness Pt is 73 y/o F admitted on 01/20/2019 with a diagnosis of Right proximal humerus fracture.  Now s/p ORIF 01/20/2019.   Clinical Impression   Patient was seen for an OT evaluation this date. Pt lives alone. Prior to surgery, pt was active and independent. Pt has orders for R UE to be immobilized and will be NWBing per MD. Patient presents with impaired strength/ROM, pain, and sensation to R UE with block not completely resolved yet. These impairments result in a decreased ability to perform self care tasks requiring MOD assist for UB/LB dressing and bathing and MAX A for application of polar care and sling/immobilizer. Pt instructed in polar care mgt, sling/immobilizer mgt, PROM only for R UE (with instructions for no shoulder PROM until full sensation has returned), R UE precautions, adaptive strategies for bathing/dressing/toileting/grooming, positioning, and home/routines modifications to maximize falls prevention, safety, and independence. Handout provided. OT adjusted sling/immobilizer to improve comfort, optimize positioning, and to maximize skin integrity/safety. Pt verbalized understanding of all education/training provided. Pt with food family support to take place at home. Recommend d/c home with family support as needed and adherence to recommended precautions.     Follow Up Recommendations  Follow surgeon's recommendation for DC plan and follow-up therapies    Equipment Recommendations  Tub/shower seat    Recommendations for Other Services       Precautions / Restrictions Precautions Precautions: Shoulder Type of Shoulder Precautions: PROM 10 ER, 45 ABD, 60 FE, Passive ROM for ADLs ONLY, not for exercises. OK to exercise elbow, wrist and hand ROM and for edema control, NO pendulums, May allow arm to dangle. Precaution Comments:  issued OT shoulder protocol handouts Required Braces or Orthoses: Sling Restrictions Weight Bearing Restrictions: Yes RUE Weight Bearing: Non weight bearing      Mobility Bed Mobility Overal bed mobility: Modified Independent                Transfers Overall transfer level: Needs assistance   Transfers: Sit to/from Stand Sit to Stand: Supervision              Balance Overall balance assessment: Needs assistance   Sitting balance-Leahy Scale: Normal       Standing balance-Leahy Scale: Good Standing balance comment: Supv provided for more dynamic standing tasks at this time, no LOB detected                           ADL either performed or assessed with clinical judgement   ADL Overall ADL's : Needs assistance/impaired Eating/Feeding: Modified independent;Set up;Sitting Eating/Feeding Details (indicate cue type and reason): requires use of L UE for self-feeding, difficulty with managing sauce packets and other two-hand fine motor tasks. Grooming: Wash/dry hands;Wash/dry face;Set up;Modified independent;Sitting Grooming Details (indicate cue type and reason): requires use of L UE for all UB hygiene tasks at this time Upper Body Bathing: Set up;Moderate assistance;Sitting   Lower Body Bathing: Set up;Moderate assistance;Sit to/from stand Lower Body Bathing Details (indicate cue type and reason): based on clinical observation Upper Body Dressing : Set up;Moderate assistance;Sitting Upper Body Dressing Details (indicate cue type and reason): requires assistance to thread post-op UE Lower Body Dressing: Set up;Moderate assistance;Sit to/from stand Lower Body Dressing Details (indicate cue type and reason): to manage puling pants up, is able to minimally use post-op R UE within precaution  parameters. Toilet Transfer: Supervision/safety   Toileting- Water quality scientist and Hygiene: Minimal assistance;Moderate assistance;Supervision/safety;Sit to/from  stand Toileting - Clothing Manipulation Details (indicate cue type and reason): based on clinical observation Tub/ Shower Transfer: English as a second language teacher Details (indicate cue type and reason): based on clinical observation Functional mobility during ADLs: Supervision/safety;Cueing for safety(cueing to avoid obstacles that R UE might bump during fxl mobility)       Vision Baseline Vision/History: Wears glasses Wears Glasses: At all times Patient Visual Report: No change from baseline       Perception     Praxis      Pertinent Vitals/Pain Pain Assessment: Faces Faces Pain Scale: Hurts little more Pain Location: R shoulder Pain Descriptors / Indicators: Aching;Guarding Pain Intervention(s): Limited activity within patient's tolerance;Monitored during session     Hand Dominance Right   Extremity/Trunk Assessment Upper Extremity Assessment Upper Extremity Assessment: RUE deficits/detail;LUE deficits/detail RUE Deficits / Details: R shoudler sling present RUE: Unable to fully assess due to pain;Unable to fully assess due to immobilization RUE Sensation: decreased light touch;decreased proprioception(R shoulder block still minimally present) LUE Deficits / Details: WFL, grossly >3/5   Lower Extremity Assessment Lower Extremity Assessment: Overall WFL for tasks assessed   Cervical / Trunk Assessment Cervical / Trunk Assessment: Normal   Communication Communication Communication: No difficulties   Cognition Arousal/Alertness: Awake/alert Behavior During Therapy: WFL for tasks assessed/performed Overall Cognitive Status: Within Functional Limits for tasks assessed                                     General Comments       Exercises Other Exercises Other Exercises: OT facilitates education with pt re: post op shoudler protocol and speficic restictions/precautions. Pt verbalized understanding. Other Exercises: OT facilitates demo re:  dangling R UE and performing ADLs while maintaining post-op precaution parameters. Pt verbalized understanding.   Shoulder Instructions      Home Living Family/patient expects to be discharged to:: Private residence Living Arrangements: Alone Available Help at Discharge: Family Type of Home: House             Bathroom Shower/Tub: Teacher, early years/pre: Standard                Prior Functioning/Environment Level of Independence: Independent                 OT Problem List: Decreased strength;Decreased range of motion;Decreased activity tolerance      OT Treatment/Interventions:      OT Goals(Current goals can be found in the care plan section) Acute Rehab OT Goals Patient Stated Goal: To go home and heal up OT Goal Formulation: All assessment and education complete, DC therapy  OT Frequency:     Barriers to D/C:            Co-evaluation              AM-PAC OT "6 Clicks" Daily Activity     Outcome Measure Help from another person eating meals?: A Little Help from another person taking care of personal grooming?: A Little Help from another person toileting, which includes using toliet, bedpan, or urinal?: A Little Help from another person bathing (including washing, rinsing, drying)?: A Lot Help from another person to put on and taking off regular upper body clothing?: A Lot Help from another person to put on and taking off regular lower body clothing?: A Little  6 Click Score: 16   End of Session Nurse Communication: (issuing of protocol handout, pt completed OT, verbalized understanding of education)  Activity Tolerance: Patient tolerated treatment well Patient left: with call bell/phone within reach(seated EOB, with family member present)  OT Visit Diagnosis: Muscle weakness (generalized) (M62.81);Pain Pain - Right/Left: Right Pain - part of body: Shoulder                Time: [id number] OT Time Calculation (min): 38 min Charges:   OT General Charges $OT Visit: 1 Visit OT Evaluation $OT Eval Low Complexity: 1 Low OT Treatments $Self Care/Home Management : 8-22 mins $Therapeutic Activity: 8-22 mins  Gerrianne Scale, MS, OTR/L ascom [phone] or [phone] 01/21/19, 11:28 AM

## 2019-01-21 NOTE — Discharge Summary (Signed)
PATIENT ID:      Tricia [name]  MRN:     [id number] DOB/AGE:    May 24, 1945 / 73 y.o.     DISCHARGE SUMMARY  ADMISSION DATE:    01/20/2019 DISCHARGE DATE:    ADMISSION DIAGNOSIS: Right proximal humerus fracture Past Medical History:  Diagnosis Date  . Tachycardia    Related to Epinephrine used for dental procedure    DISCHARGE DIAGNOSIS:   Active Problems:   Proximal humerus fracture   PROCEDURE: Procedure(s): OPEN REDUCTION INTERNAL FIXATION (ORIF) PROXIMAL HUMERUS FRACTURE on 01/20/2019  CONSULTS:    HISTORY:  See H&P in chart.  HOSPITAL COURSE:  Tricia [name] is a 73 y.o. admitted on 01/20/2019 with a diagnosis of Right proximal humerus fracture.  They were brought to the operating room on 01/20/2019 and underwent Procedure(s): OPEN REDUCTION INTERNAL FIXATION (ORIF) PROXIMAL HUMERUS FRACTURE.    They were given perioperative antibiotics:  Anti-infectives (From admission, onward)   Start     Dose/Rate Route Frequency Ordered Stop   01/20/19 1130  ceFAZolin (ANCEF) IVPB 2g/100 mL premix     2 g 200 mL/hr over 30 Minutes Intravenous On call to O.R. 01/20/19 1121 01/20/19 1414    .  Patient underwent the above named procedure and tolerated it well. The following day they were hemodynamically stable and pain was controlled on oral analgesics. They were neurovascularly intact to the operative extremity. OT was ordered and worked with patient per protocol. They were medically and orthopaedically stable for discharge on .    DIAGNOSTIC STUDIES:  RECENT RADIOGRAPHIC STUDIES :  Dg Shoulder Right  Result Date: 01/20/2019 CLINICAL DATA:  Right shoulder surgery. EXAM: RIGHT SHOULDER - 2+ VIEW COMPARISON:  None. FINDINGS: Three C-arm views of the right shoulder demonstrate screw and plate fixation of a humeral neck fracture with essentially anatomic position and alignment. IMPRESSION: Hardware fixation of a right humeral neck fracture. Electronically Signed   By: Claudie Revering M.D.    On: 01/20/2019 16:50   Dg C-arm 1-60 Min-no Report  Result Date: 01/20/2019 Fluoroscopy was utilized by the requesting physician.  No radiographic interpretation.    RECENT VITAL SIGNS:   Patient Vitals for the past 24 hrs:  BP Temp Temp src Pulse Resp SpO2 Height Weight  01/21/19 0606 119/61 98.3 F (36.8 C) Oral 60 14 100 % - -  01/21/19 0112 (!) 106/52 - - 73 - - - -  01/21/19 0059 (!) 100/46 98.4 F (36.9 C) Oral 65 18 100 % - -  01/20/19 2118 117/63 - - 92 - 100 % - -  01/20/19 2114 (!) 99/45 - - 74 - 100 % - -  01/20/19 2113 (!) 98/48 98 F (36.7 C) Oral 66 18 100 % - -  01/20/19 1934 (!) 109/51 98.3 F (36.8 C) Oral 86 16 100 % - -  01/20/19 1641 (!) 123/56 97.7 F (36.5 C) Oral (!) 59 16 100 % - -  01/20/19 1615 (!) 112/98 97.6 F (36.4 C) - 65 17 100 % - -  01/20/19 1600 (!) 123/54 - - 64 16 100 % - -  01/20/19 1545 (!) 121/55 - - 66 16 100 % - -  01/20/19 1535 123/73 97.8 F (36.6 C) - 74 19 100 % - -  01/20/19 1303 (!) 105/52 - - 78 16 100 % - -  01/20/19 1258 (!) 102/49 - - 71 14 100 % - -  01/20/19 1253 (!) 101/47 - - 69 15 100 % - -  01/20/19 1248 (!) 94/47 - - 70 16 100 % - -  01/20/19 1243 (!) 102/44 - - 70 15 100 % - -  01/20/19 1238 (!) 99/45 - - 82 15 100 % - -  01/20/19 1233 (!) 101/48 - - 69 14 100 % - -  01/20/19 1228 (!) 101/46 - - 72 14 100 % - -  01/20/19 1223 (!) 111/52 - - 63 12 100 % - -  01/20/19 1218 (!) 114/52 - - 67 16 100 % - -  01/20/19 1213 (!) 118/58 - - 68 19 100 % - -  01/20/19 1209 - - - - - - 5\' 2"  (1.575 m) 45.4 kg  01/20/19 1148 (!) 102/52 98.5 F (36.9 C) Oral (!) 57 18 100 % - -  .  RECENT EKG RESULTS:    Orders placed or performed in visit on 12/11/17  . EKG 12-Lead    DISCHARGE INSTRUCTIONS:    DISCHARGE MEDICATIONS:   Allergies as of 01/21/2019      Reactions   Codeine    Emotional-Pt reports that she feels like crying   Gluten Meal    Milk-related Compounds    Other    MSG and oats   Sulfa Antibiotics     GI problems   Iodine Rash   Patient states she had a rash with IV contrast   Penicillins Rash   Patient states she cannot remember allergy. Happened early adulthood      Medication List    STOP taking these medications   cyclobenzaprine 10 MG tablet Commonly known as: FLEXERIL     TAKE these medications   methocarbamol 500 MG tablet Commonly known as: Robaxin Take 1 tablet (500 mg total) by mouth 3 (three) times daily as needed for muscle spasms.   MULTIVITAMIN PO Take 1 tablet by mouth daily.   OMEGA 3 PO Take 500 mg by mouth 2 (two) times daily.   ondansetron 4 MG tablet Commonly known as: Zofran Take 1 tablet (4 mg total) by mouth every 8 (eight) hours as needed for nausea or vomiting.   OVER THE COUNTER MEDICATION Iodine 7 drops daily   OVER THE COUNTER MEDICATION thytrophen  3 per day.   OVER THE COUNTER MEDICATION OsteoSheath 1224 mg daily.   OVER THE COUNTER MEDICATION   OVER THE COUNTER MEDICATION   oxyCODONE-acetaminophen 5-325 MG tablet Commonly known as: PERCOCET/ROXICET Take 1 tablet by mouth every 6 (six) hours as needed for severe pain.   PROBIOTIC DAILY PO Take 1 tablet by mouth daily.   VITAMIN B COMPLEX PO Take by mouth.   vitamin B-6 500 MG tablet Take 500 mg by mouth daily.   VITAMIN C PO Take 1 tablet by mouth daily.   VITAMIN D PO Take 5,000 Units by mouth daily.       FOLLOW UP VISIT:   Follow-up Information    Justice Britain, MD.   Specialty: Orthopedic Surgery Why: call to be seen in 10-14 days Contact information: [address] [place] [address] [id number]           DISCHARGE TO: Home   DISCHARGE CONDITION:  Tricia [name] Tricia [name] for Dr. Lennette Bihari Supple 01/21/2019, 9:03 AM

## 2019-01-25 ENCOUNTER — Telehealth: Payer: Self-pay | Admitting: *Deleted

## 2019-01-25 NOTE — Telephone Encounter (Signed)
I have attempted to contact this patient by phone with the following results: I will continue to try later.

## 2019-01-27 ENCOUNTER — Telehealth: Payer: Self-pay | Admitting: *Deleted

## 2019-01-27 NOTE — Telephone Encounter (Signed)
I left a message for the patient to return my call.

## 2019-01-31 ENCOUNTER — Telehealth: Payer: Self-pay | Admitting: *Deleted

## 2019-01-31 NOTE — Telephone Encounter (Signed)
Called patient on 01/31/2019 , 2:38 PM in an attempt to reach the patient for a hospital follow up.  Admit date: 01/20/19 Discharge date: 01/21/19.

## 2019-02-04 DIAGNOSIS — Z4789 Encounter for other orthopedic aftercare: Secondary | ICD-10-CM | POA: Diagnosis not present

## 2019-02-27 ENCOUNTER — Encounter: Payer: Self-pay | Admitting: Internal Medicine

## 2019-02-27 NOTE — Progress Notes (Signed)
Tricia [name]                                                                                                                                                                                                                                                                                                 This very nice 73 y.o.female presents for a Screening /Preventative Visit & comprehensive evaluation and management of multiple medical co-morbidities.  Patient has been followed been followed expectantly for labile  HTN, HLD, Prediabetes and Vitamin D Deficiency. ++++++++++++++++++++++++++++++++++++++++++++++++++++++++++++++++++++++++++++++++++++++++++++++++  OV note from 10/17/2014 - "Patient does relate that she sees a Restaurant manager, fast food and also a Homeopath working in the same office in [place] where they "hook" her up with wires to a "machine" &they diagnose her with various deficiencies , additives, "ingredients" and sell he "antidotes" to maintain her balance. 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 ++++++++++++++++++++++++++++++++++++++++++++++++++++++++++++++++++++++++++++++++++++++++++++++++     On Oct 8, patient had ORIF of a prox humeral fracture by Dr Justice Britain.      HTN predates since     . Patient's BP has been controlled at home and patient denies any cardiac symptoms as chest pain, palpitations, shortness of breath, dizziness or ankle swelling. Today's        Patient's  hyperlipidemia is controlled with diet.  Last lipids were at goal:  Lab Results  Component Value Date   CHOL 185 03/18/2018   HDL 84 03/18/2018   LDLCALC 85 03/18/2018   TRIG 72 03/18/2018   CHOLHDL 2.2 03/18/2018      Patient has been followed expectantly for glucose intolerance  and patient denies reactive hypoglycemic symptoms, visual blurring, diabetic polys or paresthesias. Last A1c was Normal & at goal:  Lab Results  Component Value Date   HGBA1C 5.2 12/11/2017  Finally, patient has history of Vitamin D Deficiency and last Vitamin D was at goal:  Lab Results  Component Value Date   VD25OH 59 12/11/2017

## 2019-02-28 ENCOUNTER — Ambulatory Visit (INDEPENDENT_AMBULATORY_CARE_PROVIDER_SITE_OTHER): Payer: PPO | Admitting: Internal Medicine

## 2019-02-28 DIAGNOSIS — Z5329 Procedure and treatment not carried out because of patient's decision for other reasons: Secondary | ICD-10-CM

## 2019-03-07 DIAGNOSIS — Z4789 Encounter for other orthopedic aftercare: Secondary | ICD-10-CM | POA: Diagnosis not present

## 2019-03-22 DIAGNOSIS — M25511 Pain in right shoulder: Secondary | ICD-10-CM | POA: Diagnosis not present

## 2019-03-22 DIAGNOSIS — S42201D Unspecified fracture of upper end of right humerus, subsequent encounter for fracture with routine healing: Secondary | ICD-10-CM | POA: Diagnosis not present

## 2019-03-22 DIAGNOSIS — R29898 Other symptoms and signs involving the musculoskeletal system: Secondary | ICD-10-CM | POA: Diagnosis not present

## 2019-03-22 DIAGNOSIS — M25611 Stiffness of right shoulder, not elsewhere classified: Secondary | ICD-10-CM | POA: Diagnosis not present

## 2019-03-23 ENCOUNTER — Ambulatory Visit: Payer: Self-pay | Admitting: Adult Health

## 2019-03-24 DIAGNOSIS — H43811 Vitreous degeneration, right eye: Secondary | ICD-10-CM | POA: Diagnosis not present

## 2019-03-25 DIAGNOSIS — M25511 Pain in right shoulder: Secondary | ICD-10-CM | POA: Diagnosis not present

## 2019-03-25 DIAGNOSIS — S42201D Unspecified fracture of upper end of right humerus, subsequent encounter for fracture with routine healing: Secondary | ICD-10-CM | POA: Diagnosis not present

## 2019-03-25 DIAGNOSIS — M25611 Stiffness of right shoulder, not elsewhere classified: Secondary | ICD-10-CM | POA: Diagnosis not present

## 2019-03-25 DIAGNOSIS — R29898 Other symptoms and signs involving the musculoskeletal system: Secondary | ICD-10-CM | POA: Diagnosis not present

## 2019-03-29 DIAGNOSIS — S42201D Unspecified fracture of upper end of right humerus, subsequent encounter for fracture with routine healing: Secondary | ICD-10-CM | POA: Diagnosis not present

## 2019-03-29 DIAGNOSIS — M25511 Pain in right shoulder: Secondary | ICD-10-CM | POA: Diagnosis not present

## 2019-03-29 DIAGNOSIS — M25611 Stiffness of right shoulder, not elsewhere classified: Secondary | ICD-10-CM | POA: Diagnosis not present

## 2019-03-29 DIAGNOSIS — R29898 Other symptoms and signs involving the musculoskeletal system: Secondary | ICD-10-CM | POA: Diagnosis not present

## 2019-04-01 DIAGNOSIS — S42201D Unspecified fracture of upper end of right humerus, subsequent encounter for fracture with routine healing: Secondary | ICD-10-CM | POA: Diagnosis not present

## 2019-04-01 DIAGNOSIS — R29898 Other symptoms and signs involving the musculoskeletal system: Secondary | ICD-10-CM | POA: Diagnosis not present

## 2019-04-01 DIAGNOSIS — M25611 Stiffness of right shoulder, not elsewhere classified: Secondary | ICD-10-CM | POA: Diagnosis not present

## 2019-04-01 DIAGNOSIS — M25511 Pain in right shoulder: Secondary | ICD-10-CM | POA: Diagnosis not present

## 2019-04-04 DIAGNOSIS — M25511 Pain in right shoulder: Secondary | ICD-10-CM | POA: Diagnosis not present

## 2019-04-04 DIAGNOSIS — S42201D Unspecified fracture of upper end of right humerus, subsequent encounter for fracture with routine healing: Secondary | ICD-10-CM | POA: Diagnosis not present

## 2019-04-04 DIAGNOSIS — R29898 Other symptoms and signs involving the musculoskeletal system: Secondary | ICD-10-CM | POA: Diagnosis not present

## 2019-04-04 DIAGNOSIS — M25611 Stiffness of right shoulder, not elsewhere classified: Secondary | ICD-10-CM | POA: Diagnosis not present

## 2019-04-14 DIAGNOSIS — S42201D Unspecified fracture of upper end of right humerus, subsequent encounter for fracture with routine healing: Secondary | ICD-10-CM | POA: Diagnosis not present

## 2019-04-14 DIAGNOSIS — R29898 Other symptoms and signs involving the musculoskeletal system: Secondary | ICD-10-CM | POA: Diagnosis not present

## 2019-04-14 DIAGNOSIS — M25611 Stiffness of right shoulder, not elsewhere classified: Secondary | ICD-10-CM | POA: Diagnosis not present

## 2019-04-14 DIAGNOSIS — M25511 Pain in right shoulder: Secondary | ICD-10-CM | POA: Diagnosis not present

## 2019-04-18 DIAGNOSIS — S42211D Unspecified displaced fracture of surgical neck of right humerus, subsequent encounter for fracture with routine healing: Secondary | ICD-10-CM | POA: Diagnosis not present

## 2019-04-18 DIAGNOSIS — Z4789 Encounter for other orthopedic aftercare: Secondary | ICD-10-CM | POA: Diagnosis not present

## 2019-04-26 ENCOUNTER — Encounter: Payer: Self-pay | Admitting: Adult Health

## 2019-04-26 ENCOUNTER — Ambulatory Visit (INDEPENDENT_AMBULATORY_CARE_PROVIDER_SITE_OTHER): Payer: PPO | Admitting: Adult Health

## 2019-04-26 ENCOUNTER — Other Ambulatory Visit: Payer: Self-pay

## 2019-04-26 VITALS — BP 122/80 | HR 79 | Temp 97.4°F | Wt 102.0 lb

## 2019-04-26 DIAGNOSIS — R5383 Other fatigue: Secondary | ICD-10-CM

## 2019-04-26 DIAGNOSIS — R5381 Other malaise: Secondary | ICD-10-CM

## 2019-04-26 DIAGNOSIS — J029 Acute pharyngitis, unspecified: Secondary | ICD-10-CM

## 2019-04-26 LAB — POCT RAPID STREP A (OFFICE): Rapid Strep A Screen: NEGATIVE

## 2019-04-26 NOTE — Progress Notes (Signed)
Assessment and Plan:  Mieshia was seen today for sore throat and leg pain.  Diagnoses and all orders for this visit:  Sore throat/ Malaise and fatigue Benign exam today; however "rebound" sx are concerning, also with fatigue/malaise Rapid strep is negative today  Will check epstein-barr  Cannot r/o covid 19 - she is agreeable to going through drive through testing to r/o ? Alternately allergies; recommended antihistamine, nasal sprays, saline spray Suggested symptomatic OTC remedies. increase fluids,  Salt water gargles, sugar free cough drops/throat lozenges If symptoms do not improve in 3-5 days or get worse contact the office Discussed steroid, zpak due to duration and rebound sx; she declines this today; she will consider if sx worsen or with fever or elevated white count Follow up as needed. -     POCT rapid strep A -     CBC with Diff -     Epstein-Barr Virus VCA Antibody Panel -     Urinalysis w microscopic + reflex cultur  Further disposition pending results of labs. Discussed med's effects and SE's.   Over 20 minutes of exam, counseling, chart review, and critical decision making was performed.   Future Appointments  Date Time Provider [place]  05/02/2019 11:00 AM [name], MD [facility] None  08/16/2019 10:00 AM Garnet Sierras, NP [facility] None    ------------------------------------------------------------------------------------------------------------------   HPI BP 122/80   Pulse 79   Temp (!) 97.4 F (36.3 C)   Wt 102 lb (46.3 kg)   SpO2 98%   BMI 18.66 kg/m   74 y.o.female with hx of htn presents for evaluation of sore throat ongoing since Christmas eve; she reports onset was gradual, sore throat, generalized aching, some headache, felt feverish, but no temp. She reports she took some herbal wellness supplements which seemed to help, felt well for a few days prior to the weekend but then started feeling bad again this past weekend with  progressively worsening symptoms.   She reports persistent sore throat, not severe "swallowing razor blades" like strep (negative) Persistent symptoms; denies difficulty swallowing. She also reports fatigue, will have to take frequent naps after basic activities. Denies current headache. Endorses some aching in legs and neck/back intermittently. Denies rash. Denies cough, dyspnea, wheezing, GI sx. Sinus sx but endorses some intermittent runny nose. Unsure of post nasal drip. She does report feels feverish today but has had no temp here or at home.    She reports also had some ear pain but this resolved by applying peroxide.   Has tried herbal supplements, "wellness supplement," topical essential oils, garlic supplement. Helped initially but not helping this time. She has been doing salt water gargles as well which are helping. She does endorse hx of mild allergies, typically well managed with home remedies. Denies sneezing, itching.   She denies known sick contacts; denies hx of acid reflux.   Underwent ORIF R proximal humerus fracture on 01/20/2019 by Dr. Onnie Graham.   Past Medical History:  Diagnosis Date  . Tachycardia    Related to Epinephrine used for dental procedure     Allergies  Allergen Reactions  . Codeine     Emotional-Pt reports that she feels like crying  . Gluten Meal   . Milk-Related Compounds   . Other     MSG and oats  . Sulfa Antibiotics     GI problems  . Iodine Rash    Patient states she had a rash with IV contrast    Current Outpatient Medications on File  Prior to Visit  Medication Sig  . Ascorbic Acid (VITAMIN C PO) Take 1 tablet by mouth daily.   . B Complex Vitamins (VITAMIN B COMPLEX PO) Take by mouth.  . Cholecalciferol (VITAMIN D PO) Take 5,000 Units by mouth daily.   . Multiple Vitamins-Minerals (MULTIVITAMIN PO) Take 1 tablet by mouth daily.   . Omega-3 Fatty Acids (OMEGA 3 PO) Take 500 mg by mouth 2 (two) times daily.  Marland Kitchen OVER THE COUNTER MEDICATION  Iodine 7 drops daily  . OVER THE COUNTER MEDICATION thytrophen  3 per day.  Marland Kitchen OVER THE COUNTER MEDICATION OsteoSheath 1224 mg daily.  Marland Kitchen OVER THE COUNTER MEDICATION CBD oil  . OVER THE COUNTER MEDICATION ASHWAGHANDA DROPS  . Probiotic Product (PROBIOTIC DAILY PO) Take 1 tablet by mouth daily.   . Pyridoxine HCl (VITAMIN B-6) 500 MG tablet Take 500 mg by mouth daily.   No current facility-administered medications on file prior to visit.    ROS: all negative except above.   Physical Exam:  BP 122/80   Pulse 79   Temp (!) 97.4 F (36.3 C)   Wt 102 lb (46.3 kg)   SpO2 98%   BMI 18.66 kg/m   General Appearance: Well nourished, well dressed elderly female, in no apparent distress. Eyes: PERRLA, conjunctiva no swelling or erythema Sinuses: No Frontal/maxillary tenderness ENT/Mouth: Ext aud canals clear, TMs without erythema, bulging. No erythema, swelling, or exudate on post pharynx.  Tonsils not swollen or erythematous. Hearing normal.  Neck: Supple, thyroid normal.  Respiratory: Respiratory effort normal, BS equal bilaterally without rales, rhonchi, wheezing or stridor.  Cardio: RRR with no MRGs. Brisk peripheral pulses without edema.  Abdomen: Soft, + BS.  Non tender, no guarding, rebound, hernias, masses. Lymphatics: Non tender without lymphadenopathy.  Musculoskeletal: No obvious deformity, effusion, erythema; normal gait.  Skin: Warm, dry without rashes, lesions, ecchymosis.  Neuro: Cranial nerves intact. Normal muscle tone, no cerebellar symptoms. Sensation intact.  Psych: Awake and oriented X 3, normal affect, Insight and Judgment appropriate.     Izora Ribas, NP 4:26 PM [facility]

## 2019-04-26 NOTE — Patient Instructions (Signed)
Please call to set up covid 19 drive through appointment - [phone]    -Make sure you are drinking plenty of fluids to stay hydrated.  - voice rest - sugar free cough drops/throat lozenges - consider adding allergy medication, or nasal sprays (astalin, flonase/fluticasone) for drippy nose -while drinking fluids pinch and hold nose close and swallow, to help open eustachian tubes and drain fluid behind ear drums to relieve ear pressure -you can do salt water gargles. You can also do 1 TSP liquid Maalox and 1 TSP liquid benadryl- mix/ gargle/ spit  If you are not feeling better, if your symptoms worsen or with new fever then please call the office.  Medicines you can use  Nasal congestion or drainage  Little Remedies saline spray (aerosol/mist)- can try this, it is in the kids section - pseudoephedrine (Sudafed)- behind the counter, do not use if you have high blood pressure, medicine that have -D in them.  - phenylephrine (Sudafed PE) -Dextormethorphan + chlorpheniramine (Coridcidin HBP)- okay if you have high blood pressure -Oxymetazoline (Afrin) nasal spray- LIMIT to 3 days -Saline nasal spray -Neti pot (used distilled or bottled water)  Ear pain/congestion  -pseudoephedrine (sudafed) - Nasonex/flonase nasal spray  Fever  -Acetaminophen (Tyelnol) -Ibuprofen (Advil, motrin, aleve)  Sore Throat  -Acetaminophen (Tyelnol) -Ibuprofen (Advil, motrin, aleve) -Drink a lot of water -Gargle with salt water - Rest your voice (don't talk) -Throat sprays -Cough drops  Body Aches  -Acetaminophen (Tyelnol) -Ibuprofen (Advil, motrin, aleve)  Headache  -Acetaminophen (Tyelnol) -Ibuprofen (Advil, motrin, aleve) - Exedrin, Exedrin Migraine  Allergy symptoms (cough, sneeze, runny nose, itchy eyes) -Claritin or loratadine cheapest but likely the weakest  -Zyrtec or certizine at night because it can make you sleepy -The strongest is allegra or fexafinadine  Cheapest at [employer],  [employer], [employer]  Cough  -Dextromethorphan (Delsym)- medicine that has DM in it -Guafenesin (Mucinex/Robitussin) - cough drops - drink lots of water  Chest Congestion  -Guafenesin (Mucinex/Robitussin)  General health when sick  -Hydration -wash your hands frequently -keep surfaces clean -change pillow cases and sheets often -Get fresh air but do not exercise strenuously -Vitamin D, double up on it - Vitamin C -Zinc   Pharyngitis Pharyngitis is redness, pain, and swelling (inflammation) of your pharynx.  CAUSES  Pharyngitis is usually caused by infection. Most of the time, these infections are from viruses (viral) and are part of a cold. However, sometimes pharyngitis is caused by bacteria (bacterial). Pharyngitis can also be caused by allergies. Viral pharyngitis may be spread from person to person by coughing, sneezing, and personal items or utensils (cups, forks, spoons, toothbrushes). Bacterial pharyngitis may be spread from person to person by more intimate contact, such as kissing.  SIGNS AND SYMPTOMS  Symptoms of pharyngitis include:   Sore throat.   Tiredness (fatigue).   Low-grade fever.   Headache.  Joint pain and muscle aches.  Skin rashes.  Swollen lymph nodes.  Plaque-like film on throat or tonsils (often seen with bacterial pharyngitis). DIAGNOSIS  Your health care provider will ask you questions about your illness and your symptoms. Your medical history, along with a physical exam, is often all that is needed to diagnose pharyngitis. Sometimes, a rapid strep test is done. Other lab tests may also be done, depending on the suspected cause.  TREATMENT  Viral pharyngitis will usually get better in 3-4 days without the use of medicine. Bacterial pharyngitis is treated with medicines that kill germs (antibiotics).  HOME CARE INSTRUCTIONS  Drink enough water and fluids to keep your urine clear or pale yellow.   Only take over-the-counter or  prescription medicines as directed by your health care provider:   If you are prescribed antibiotics, make sure you finish them even if you start to feel better.   Do not take aspirin.   Get lots of rest.   Gargle with 8 oz of salt water ( tsp of salt per 1 qt of water) as often as every 1-2 hours to soothe your throat.   Throat lozenges (if you are not at risk for choking) or sprays may be used to soothe your throat. SEEK MEDICAL CARE IF:   You have large, tender lumps in your neck.  You have a rash.  You cough up green, yellow-brown, or bloody spit. SEEK IMMEDIATE MEDICAL CARE IF:   Your neck becomes stiff.  You drool or are unable to swallow liquids.  You vomit or are unable to keep medicines or liquids down.  You have severe pain that does not go away with the use of recommended medicines.  You have trouble breathing (not caused by a stuffy nose). MAKE SURE YOU:   Understand these instructions.  Will watch your condition.  Will get help right away if you are not doing well or get worse. Document Released: 03/31/2005 Document Revised: 01/19/2013 Document Reviewed: 12/06/2012 [facility] Patient Information 2015 [place], [place]. This information is not intended to replace advice given to you by your health care provider. Make sure you discuss any questions you have with your health care provider.

## 2019-04-27 LAB — CBC WITH DIFFERENTIAL/PLATELET
Absolute Monocytes: 337 cells/uL (ref 200–950)
Basophils Absolute: 30 cells/uL (ref 0–200)
Basophils Relative: 0.9 %
Eosinophils Absolute: 59 cells/uL (ref 15–500)
Eosinophils Relative: 1.8 %
HCT: 36.5 % (ref 35.0–45.0)
Hemoglobin: 12.6 g/dL (ref 11.7–15.5)
Lymphs Abs: 855 cells/uL (ref 850–3900)
MCH: 32.1 pg (ref 27.0–33.0)
MCHC: 34.5 g/dL (ref 32.0–36.0)
MCV: 93.1 fL (ref 80.0–100.0)
MPV: 9.9 fL (ref 7.5–12.5)
Monocytes Relative: 10.2 %
Neutro Abs: 2020 cells/uL (ref 1500–7800)
Neutrophils Relative %: 61.2 %
Platelets: 199 10*3/uL (ref 140–400)
RBC: 3.92 10*6/uL (ref 3.80–5.10)
RDW: 11.7 % (ref 11.0–15.0)
Total Lymphocyte: 25.9 %
WBC: 3.3 10*3/uL — ABNORMAL LOW (ref 3.8–10.8)

## 2019-04-27 LAB — EPSTEIN-BARR VIRUS VCA ANTIBODY PANEL
EBV NA IgG: 454 U/mL — ABNORMAL HIGH
EBV VCA IgG: 199 U/mL — ABNORMAL HIGH
EBV VCA IgM: <36 U/mL

## 2019-04-28 ENCOUNTER — Other Ambulatory Visit: Payer: PPO

## 2019-04-28 ENCOUNTER — Ambulatory Visit: Payer: PPO | Attending: Internal Medicine

## 2019-04-28 DIAGNOSIS — Z20822 Contact with and (suspected) exposure to covid-19: Secondary | ICD-10-CM | POA: Diagnosis not present

## 2019-04-28 LAB — CULTURE INDICATED

## 2019-04-28 LAB — URINALYSIS W MICROSCOPIC + REFLEX CULTURE
Bilirubin Urine: NEGATIVE
Glucose, UA: NEGATIVE
Hgb urine dipstick: NEGATIVE
Hyaline Cast: NONE SEEN /LPF
Ketones, ur: NEGATIVE
Nitrites, Initial: NEGATIVE
Protein, ur: NEGATIVE
RBC / HPF: NONE SEEN /HPF (ref 0–2)
Specific Gravity, Urine: 1.008 (ref 1.001–1.03)
Squamous Epithelial / HPF: NONE SEEN /HPF (ref <=5)
pH: 7 (ref 5.0–8.0)

## 2019-04-28 LAB — URINE CULTURE
MICRO NUMBER:: 10034603
Result:: NO GROWTH
SPECIMEN QUALITY:: ADEQUATE

## 2019-04-30 LAB — NOVEL CORONAVIRUS, NAA: SARS-CoV-2, NAA: NOT DETECTED

## 2019-05-01 ENCOUNTER — Encounter: Payer: Self-pay | Admitting: Internal Medicine

## 2019-05-01 NOTE — Patient Instructions (Signed)
Vit D  &  - Link to sign up at [employer] site for the Covid-19 vaccine   [email or web address].[email or web address]   Or call [phone]  ++++++++++++++++++++++++++++++++ Vit C 1,000 mg   are recommended to help protect  against the Covid-19 and other Corona viruses.    Also it's recommended  to take  Zinc 50 mg  to help  protect against the Covid-19   and best place to get  is also on [employer].com  and don't pay more than 6-8 cents /pill !  ================================ Coronavirus (COVID-19) Are you at risk?  Are you at risk for the Coronavirus (COVID-19)?  To be considered HIGH RISK for Coronavirus (COVID-19), you have to meet the following criteria:  . Traveled to [place], [place], [place], [place] or [place]; or in the [place] to [place], [place], [place]  . or [place]; and have fever, cough, and shortness of breath within the last 2 weeks of travel OR . Been in close contact with a person diagnosed with COVID-19 within the last 2 weeks and have  . fever, cough,and shortness of breath .  . IF YOU DO NOT MEET THESE CRITERIA, YOU ARE CONSIDERED LOW RISK FOR COVID-19.  What to do if you are HIGH RISK for COVID-19?  Marland Kitchen If you are having a medical emergency, call 911. . Seek medical care right away. Before you go to a doctor's office, urgent care or emergency department, .  call ahead and tell them about your recent travel, contact with someone diagnosed with COVID-19  .  and your symptoms.  . You should receive instructions from your physician's office regarding next steps of care.  . When you arrive at healthcare provider, tell the healthcare staff immediately you have returned from  . visiting [place], [place], [place], [place] or [place]; or traveled in the [place] to [place], [place],  . [place] or [place] in the last two  weeks or you have been in close contact with a person diagnosed with  . COVID-19 in the last 2 weeks.   . Tell the health care staff about your symptoms: fever, cough and shortness of breath. . After you have been seen by a medical provider, you will be either: o Tested for (COVID-19) and discharged home on quarantine except to seek medical care if  o symptoms worsen, and asked to  - Stay home and avoid contact with others until you get your results (4-5 days)  - Avoid travel on public transportation if possible (such as bus, train, or airplane) or o Sent to the Emergency Department by EMS for evaluation, COVID-19 testing  and  o possible admission depending on your condition and test results.  What to do if you are LOW RISK for COVID-19?  Reduce your risk of any infection by using the same precautions used for avoiding the common cold or flu:  Marland Kitchen Wash your hands often with soap and warm water for at least 20 seconds.  If soap and water are not readily available,  . use an alcohol-based hand sanitizer with at least 60% alcohol.  . If coughing or sneezing, cover your mouth and nose by coughing or sneezing into the elbow areas of your shirt or coat, .  into a tissue or into your sleeve (not your hands). . Avoid shaking hands with others and consider head nods or verbal greetings only. . Avoid touching your eyes, nose, or mouth with unwashed hands.  Marland Kitchen  Avoid close contact with people who are sick. . Avoid places or events with large numbers of people in one location, like concerts or sporting events. . Carefully consider travel plans you have or are making. . If you are planning any travel outside or inside the [place], visit the CDC's Travelers' Health webpage for the latest health notices. . If you have some symptoms but not all symptoms, continue to monitor at home and seek medical attention  . if your symptoms worsen. . If you are having a medical emergency, call  911.   . >>>>>>>>>>>>>>>>>>>>>>>>>>>>>>>>> . We Do NOT Approve of  [facility], [employer]  Our Patients  To Do Home Visits & We Do NOT Approve of [employer] > > > > > > > > > > > > > > > > > > > > > > > > > > > > > > > > > > > > > > >  Preventive Care for Adults  A healthy lifestyle and preventive care can promote health and wellness. Preventive health guidelines for women include the following key practices.  A routine yearly physical is a good way to check with your health care provider about your health and preventive screening. It is a chance to share any concerns and updates on your health and to receive a thorough exam.  Visit your dentist for a routine exam and preventive care every 6 months. Brush your teeth twice a day and floss once a day. Good oral hygiene prevents tooth decay and gum disease.  The frequency of eye exams is based on your age, health, family medical history, use of contact lenses, and other factors. Follow your health care provider's recommendations for frequency of eye exams.  Eat a healthy diet. Foods like vegetables, fruits, whole grains, low-fat dairy products, and lean protein foods contain the nutrients you need without too many calories. Decrease your intake of foods high in solid fats, added sugars, and salt. Eat the right amount of calories for you. Get information about a proper diet from your health care provider, if necessary.  Regular physical exercise is one of the most important things you can do for your health. Most adults should get at least 150 minutes of moderate-intensity exercise (any activity that increases your heart rate and causes you to sweat) each week. In addition, most adults need muscle-strengthening exercises on 2 or more days a week.  Maintain a healthy weight. The body mass index (BMI) is a screening tool to identify possible weight problems. It provides an estimate of body fat based on height and  weight. Your health care provider can find your BMI and can help you achieve or maintain a healthy weight. For adults 20 years and older:  A BMI below 18.5 is considered underweight.  A BMI of 18.5 to 24.9 is normal.  A BMI of 25 to 29.9 is considered overweight.  A BMI of 30 and above is considered obese.  Maintain normal blood lipids and cholesterol levels by exercising and minimizing your intake of saturated fat. Eat a balanced diet with plenty of fruit and vegetables. If your lipid or cholesterol levels are high, you are over 50, or you are at high risk for heart disease, you may need your cholesterol levels checked more frequently. Ongoing high lipid and cholesterol levels should be treated with medicines if diet and exercise are not working.  If you smoke, find out from your health care provider how to quit. If you  do not use tobacco, do not start.  Lung cancer screening is recommended for adults aged 55-80 years who are at high risk for developing lung cancer because of a history of smoking. A yearly low-dose CT scan of the lungs is recommended for people who have at least a 30-pack-year history of smoking and are a current smoker or have quit within the past 15 years. A pack year of smoking is smoking an average of 1 pack of cigarettes a day for 1 year (for example: 1 pack a day for 30 years or 2 packs a day for 15 years). Yearly screening should continue until the smoker has stopped smoking for at least 15 years. Yearly screening should be stopped for people who develop a health problem that would prevent them from having lung cancer treatment.  Avoid use of street drugs. Do not share needles with anyone. Ask for help if you need support or instructions about stopping the use of drugs.  High blood pressure causes heart disease and increases the risk of stroke.  Ongoing high blood pressure should be treated with medicines if weight loss and exercise do not work.  If you are 55-79 years  old, ask your health care provider if you should take aspirin to prevent strokes.  Diabetes screening involves taking a blood sample to check your fasting blood sugar level. This should be done once every 3 years, after age 45, if you are within normal weight and without risk factors for diabetes. Testing should be considered at a younger age or be carried out more frequently if you are overweight and have at least 1 risk factor for diabetes.  Breast cancer screening is essential preventive care for women. You should practice "breast self-awareness." This means understanding the normal appearance and feel of your breasts and may include breast self-examination. Any changes detected, no matter how small, should be reported to a health care provider. Women in their 20s and 30s should have a clinical breast exam (CBE) by a health care provider as part of a regular health exam every 1 to 3 years. After age 40, women should have a CBE every year. Starting at age 40, women should consider having a mammogram (breast X-ray test) every year. Women who have a family history of breast cancer should talk to their health care provider about genetic screening. Women at a high risk of breast cancer should talk to their health care providers about having an MRI and a mammogram every year.  Breast cancer gene (BRCA)-related cancer risk assessment is recommended for women who have family members with BRCA-related cancers. BRCA-related cancers include breast, ovarian, tubal, and peritoneal cancers. Having family members with these cancers may be associated with an increased risk for harmful changes (mutations) in the breast cancer genes BRCA1 and BRCA2. Results of the assessment will determine the need for genetic counseling and BRCA1 and BRCA2 testing.  Routine pelvic exams to screen for cancer are no longer recommended for nonpregnant women who are considered low risk for cancer of the pelvic organs (ovaries, uterus, and  vagina) and who do not have symptoms. Ask your health care provider if a screening pelvic exam is right for you.  If you have had past treatment for cervical cancer or a condition that could lead to cancer, you need Pap tests and screening for cancer for at least 20 years after your treatment. If Pap tests have been discontinued, your risk factors (such as having a new sexual partner) need to be   reassessed to determine if screening should be resumed. Some women have medical problems that increase the chance of getting cervical cancer. In these cases, your health care provider may recommend more frequent screening and Pap tests.    Colorectal cancer can be detected and often prevented. Most routine colorectal cancer screening begins at the age of 50 years and continues through age 75 years. However, your health care provider may recommend screening at an earlier age if you have risk factors for colon cancer. On a yearly basis, your health care provider may provide home test kits to check for hidden blood in the stool. Use of a small camera at the end of a tube, to directly examine the colon (sigmoidoscopy or colonoscopy), can detect the earliest forms of colorectal cancer. Talk to your health care provider about this at age 50, when routine screening begins.  Direct exam of the colon should be repeated every 5-10 years through age 75 years, unless early forms of pre-cancerous polyps or small growths are found.  Osteoporosis is a disease in which the bones lose minerals and strength with aging. This can result in serious bone fractures or breaks. The risk of osteoporosis can be identified using a bone density scan. Women ages 65 years and over and women at risk for fractures or osteoporosis should discuss screening with their health care providers. Ask your health care provider whether you should take a calcium supplement or vitamin D to reduce the rate of osteoporosis.  Menopause can be associated with  physical symptoms and risks. Hormone replacement therapy is available to decrease symptoms and risks. You should talk to your health care provider about whether hormone replacement therapy is right for you.  Use sunscreen. Apply sunscreen liberally and repeatedly throughout the day. You should seek shade when your shadow is shorter than you. Protect yourself by wearing long sleeves, pants, a wide-brimmed hat, and sunglasses year round, whenever you are outdoors.  Once a month, do a whole body skin exam, using a mirror to look at the skin on your back. Tell your health care provider of new moles, moles that have irregular borders, moles that are larger than a pencil eraser, or moles that have changed in shape or color.  Stay current with required vaccines (immunizations).  Influenza vaccine. All adults should be immunized every year.  Tetanus, diphtheria, and acellular pertussis (Td, Tdap) vaccine. Pregnant women should receive 1 dose of Tdap vaccine during each pregnancy. The dose should be obtained regardless of the length of time since the last dose. Immunization is preferred during the 27th-36th week of gestation. An adult who has not previously received Tdap or who does not know her vaccine status should receive 1 dose of Tdap. This initial dose should be followed by tetanus and diphtheria toxoids (Td) booster doses every 10 years. Adults with an unknown or incomplete history of completing a 3-dose immunization series with Td-containing vaccines should begin or complete a primary immunization series including a Tdap dose. Adults should receive a Td booster every 10 years.    Zoster vaccine. One dose is recommended for adults aged 60 years or older unless certain conditions are present.    Pneumococcal 13-valent conjugate (PCV13) vaccine. When indicated, a person who is uncertain of her immunization history and has no record of immunization should receive the PCV13 vaccine. An adult aged 19  years or older who has certain medical conditions and has not been previously immunized should receive 1 dose of PCV13 vaccine. This PCV13   should be followed with a dose of pneumococcal polysaccharide (PPSV23) vaccine. The PPSV23 vaccine dose should be obtained at least 1 or more year(s) after the dose of PCV13 vaccine. An adult aged 19 years or older who has certain medical conditions and previously received 1 or more doses of PPSV23 vaccine should receive 1 dose of PCV13. The PCV13 vaccine dose should be obtained 1 or more years after the last PPSV23 vaccine dose.    Pneumococcal polysaccharide (PPSV23) vaccine. When PCV13 is also indicated, PCV13 should be obtained first. All adults aged 65 years and older should be immunized. An adult younger than age 65 years who has certain medical conditions should be immunized. Any person who resides in a nursing home or long-term care facility should be immunized. An adult smoker should be immunized. People with an immunocompromised condition and certain other conditions should receive both PCV13 and PPSV23 vaccines. People with human immunodeficiency virus (HIV) infection should be immunized as soon as possible after diagnosis. Immunization during chemotherapy or radiation therapy should be avoided. Routine use of PPSV23 vaccine is not recommended for American Indians, [place] Natives, or people younger than 65 years unless there are medical conditions that require PPSV23 vaccine. When indicated, people who have unknown immunization and have no record of immunization should receive PPSV23 vaccine. One-time revaccination 5 years after the first dose of PPSV23 is recommended for people aged 19-64 years who have chronic kidney failure, nephrotic syndrome, asplenia, or immunocompromised conditions. People who received 1-2 doses of PPSV23 before age 65 years should receive another dose of PPSV23 vaccine at age 65 years or later if at least 5 years have passed since the  previous dose. Doses of PPSV23 are not needed for people immunized with PPSV23 at or after age 65 years.   Preventive Services / Frequency  Ages 65 years and over  Blood pressure check.  Lipid and cholesterol check.  Lung cancer screening. / Every year if you are aged 55-80 years and have a 30-pack-year history of smoking and currently smoke or have quit within the past 15 years. Yearly screening is stopped once you have quit smoking for at least 15 years or develop a health problem that would prevent you from having lung cancer treatment.  Clinical breast exam.** / Every year after age 40 years.   BRCA-related cancer risk assessment.** / For women who have family members with a BRCA-related cancer (breast, ovarian, tubal, or peritoneal cancers).  Mammogram.** / Every year beginning at age 40 years and continuing for as long as you are in good health. Consult with your health care provider.  Pap test.** / Every 3 years starting at age 30 years through age 65 or 70 years with 3 consecutive normal Pap tests. Testing can be stopped between 65 and 70 years with 3 consecutive normal Pap tests and no abnormal Pap or HPV tests in the past 10 years.  Fecal occult blood test (FOBT) of stool. / Every year beginning at age 50 years and continuing until age 75 years. You may not need to do this test if you get a colonoscopy every 10 years.  Flexible sigmoidoscopy or colonoscopy.** / Every 5 years for a flexible sigmoidoscopy or every 10 years for a colonoscopy beginning at age 50 years and continuing until age 75 years.  Hepatitis C blood test.** / For all people born from 1945 through 1965 and any individual with known risks for hepatitis C.  Osteoporosis screening.** / A one-time screening for women ages 65   years and over and women at risk for fractures or osteoporosis.  Skin self-exam. / Monthly.  Influenza vaccine. / Every year.  Tetanus, diphtheria, and acellular pertussis (Tdap/Td)  vaccine.** / 1 dose of Td every 10 years.  Zoster vaccine.** / 1 dose for adults aged 60 years or older.  Pneumococcal 13-valent conjugate (PCV13) vaccine.** / Consult your health care provider.  Pneumococcal polysaccharide (PPSV23) vaccine.** / 1 dose for all adults aged 65 years and older. Screening for abdominal aortic aneurysm (AAA)  by ultrasound is recommended for people who have history of high blood pressure or who are current or former smokers. ++++++++++++++++++++ Recommend Adult Low Dose Aspirin or  coated  Aspirin 81 mg daily  To reduce risk of Colon Cancer 40 %,  Skin Cancer 26 % ,  Melanoma 46%  and  Pancreatic cancer 60% ++++++++++++++++++++ Vitamin D goal  is between 70-100.  Please make sure that you are taking your Vitamin D as directed.  It is very important as a natural anti-inflammatory  helping hair, skin, and nails, as well as reducing stroke and heart attack risk.  It helps your bones and helps with mood. It also decreases numerous cancer risks so please take it as directed.  Low Vit D is associated with a 200-300% higher risk for CANCER  and 200-300% higher risk for HEART   ATTACK  &  STROKE.   ...................................... It is also associated with higher death rate at younger ages,  autoimmune diseases like Rheumatoid arthritis, Lupus, Multiple Sclerosis.    Also many other serious conditions, like depression, Alzheimer's Dementia, infertility, muscle aches, fatigue, fibromyalgia - just to name a few. ++++++++++++++++++ Recommend the book "The END of DIETING" by Dr Joel Fuhrman  & the book "The END of DIABETES " by Dr Joel Fuhrman At [employer].com - get book & Audio CD's    Being diabetic has a  300% increased risk for heart attack, stroke, cancer, and alzheimer- type vascular dementia. It is very important that you work harder with diet by avoiding all foods that are white. Avoid white rice (brown & wild rice is OK), white potatoes (sweetpotatoes  in moderation is OK), White bread or wheat bread or anything made out of white flour like bagels, donuts, rolls, buns, biscuits, cakes, pastries, cookies, pizza crust, and pasta (made from white flour & egg whites) - vegetarian pasta or spinach or wheat pasta is OK. Multigrain breads like Arnold's or [employer], or multigrain sandwich thins or flatbreads.  Diet, exercise and weight loss can reverse and cure diabetes in the early stages.  Diet, exercise and weight loss is very important in the control and prevention of complications of diabetes which affects every system in your body, ie. Brain - dementia/stroke, eyes - glaucoma/blindness, heart - heart attack/heart failure, kidneys - dialysis, stomach - gastric paralysis, intestines - malabsorption, nerves - severe painful neuritis, circulation - gangrene & loss of a leg(s), and finally cancer and Alzheimers.    I recommend avoid fried & greasy foods,  sweets/candy, white rice (brown or wild rice or Quinoa is OK), white potatoes (sweet potatoes are OK) - anything made from white flour - bagels, doughnuts, rolls, buns, biscuits,white and wheat breads, pizza crust and traditional pasta made of white flour & egg white(vegetarian pasta or spinach or wheat pasta is OK).  Multi-grain bread is OK - like multi-grain flat bread or sandwich thins. Avoid alcohol in excess. Exercise is also important.    Eat all the vegetables you want -   avoid meat, especially red meat and dairy - especially cheese.  Cheese is the most concentrated form of trans-fats which is the worst thing to clog up our arteries. Veggie cheese is OK which can be found in the fresh produce section at [employer] or [employer] or [employer]  +++++++++++++++++++ DASH Eating Plan  DASH stands for "Dietary Approaches to Stop Hypertension."   The DASH eating plan is a healthy eating plan that has been shown to reduce high blood pressure (hypertension). Additional health benefits may include  reducing the risk of type 2 diabetes mellitus, heart disease, and stroke. The DASH eating plan may also help with weight loss. WHAT DO I NEED TO KNOW ABOUT THE DASH EATING PLAN? For the DASH eating plan, you will follow these general guidelines:  Choose foods with a percent daily value for sodium of less than 5% (as listed on the food label).  Use salt-free seasonings or herbs instead of table salt or sea salt.  Check with your health care provider or pharmacist before using salt substitutes.  Eat lower-sodium products, often labeled as "lower sodium" or "no salt added."  Eat fresh foods.  Eat more vegetables, fruits, and low-fat dairy products.  Choose whole grains. Look for the word "whole" as the first word in the ingredient list.  Choose fish   Limit sweets, desserts, sugars, and sugary drinks.  Choose heart-healthy fats.  Eat veggie cheese   Eat more home-cooked food and less restaurant, buffet, and fast food.  Limit fried foods.  Cook foods using methods other than frying.  Limit canned vegetables. If you do use them, rinse them well to decrease the sodium.  When eating at a restaurant, ask that your food be prepared with less salt, or no salt if possible.                      WHAT FOODS CAN I EAT? Read Dr Fara Olden Fuhrman's books on The End of Dieting & The End of Diabetes  Grains Whole grain or whole wheat bread. Brown rice. Whole grain or whole wheat pasta. Quinoa, bulgur, and whole grain cereals. Low-sodium cereals. Corn or whole wheat flour tortillas. Whole grain cornbread. Whole grain crackers. Low-sodium crackers.  Vegetables Fresh or frozen vegetables (raw, steamed, roasted, or grilled). Low-sodium or reduced-sodium tomato and vegetable juices. Low-sodium or reduced-sodium tomato sauce and paste. Low-sodium or reduced-sodium canned vegetables.   Fruits All fresh, canned (in natural juice), or frozen fruits.  Protein Products  All fish and seafood.  Dried  beans, peas, or lentils. Unsalted nuts and seeds. Unsalted canned beans.  Dairy Low-fat dairy products, such as skim or 1% milk, 2% or reduced-fat cheeses, low-fat ricotta or cottage cheese, or plain low-fat yogurt. Low-sodium or reduced-sodium cheeses.  Fats and Oils Tub margarines without trans fats. Light or reduced-fat mayonnaise and salad dressings (reduced sodium). Avocado. Safflower, olive, or canola oils. Natural peanut or almond butter.  Other Unsalted popcorn and pretzels. The items listed above may not be a complete list of recommended foods or beverages. Contact your dietitian for more options.  +++++++++++++++  WHAT FOODS ARE NOT RECOMMENDED? Grains/ White flour or wheat flour White bread. White pasta. White rice. Refined cornbread. Bagels and croissants. Crackers that contain trans fat.  Vegetables  Creamed or fried vegetables. Vegetables in a . Regular canned vegetables. Regular canned tomato sauce and paste. Regular tomato and vegetable juices.  Fruits Dried fruits. Canned fruit in light or heavy syrup. Fruit juice.  Meat and Other Protein Products Meat in general - RED meat & White meat.  Fatty cuts of meat. Ribs, chicken wings, all processed meats as bacon, sausage, bologna, salami, fatback, hot dogs, bratwurst and packaged luncheon meats.  Dairy Whole or 2% milk, cream, half-and-half, and cream cheese. Whole-fat or sweetened yogurt. Full-fat cheeses or blue cheese. Non-dairy creamers and whipped toppings. Processed cheese, cheese spreads, or cheese curds.  Condiments Onion and garlic salt, seasoned salt, table salt, and sea salt. Canned and packaged gravies. Worcestershire sauce. Tartar sauce. Barbecue sauce. Teriyaki sauce. Soy sauce, including reduced sodium. Steak sauce. Fish sauce. Oyster sauce. Cocktail sauce. Horseradish. Ketchup and mustard. Meat flavorings and tenderizers. Bouillon cubes. Hot sauce. Tabasco sauce. Marinades. Taco seasonings.  Relishes.  Fats and Oils Butter, stick margarine, lard, shortening and bacon fat. Coconut, palm kernel, or palm oils. Regular salad dressings.  Pickles and olives. Salted popcorn and pretzels.  The items listed above may not be a complete list of foods and beverages to avoid.    

## 2019-05-01 NOTE — Progress Notes (Signed)
Annual Screening/Preventative Visit & Comprehensive Evaluation &  Examination     This very nice 74 y.o. DWF  presents for a Screening /Preventative Visit & comprehensive evaluation and management of multiple medical co-morbidities.  Patient has been followed for labile  HTN, HLD, Prediabetes  and Vitamin D Deficiency. Recently underwent ORIF R proximal humerus fracture on 01/20/2019 by Dr. Onnie Graham.     Patient's BP has been controlled on no meds and patient denies any cardiac symptoms as chest pain, palpitations, shortness of breath, dizziness or ankle swelling. Today's BP is at goal -110/60.      Patient's lipids are controlled with diet. Last lipids were at goal:  Lab Results  Component Value Date   CHOL 185 03/18/2018   HDL 84 03/18/2018   LDLCALC 85 03/18/2018   TRIG 72 03/18/2018   CHOLHDL 2.2 03/18/2018      Patient is followed expectantly for glucose intolerance.  Patient denies reactive hypoglycemic symptoms, visual blurring, diabetic polys or paresthesias. Last A1c was Normal & at goal:  Lab Results  Component Value Date   HGBA1C 5.2 12/11/2017      Finally, patient has history of Vitamin D Deficiency and last Vitamin D was at goal:  Lab Results  Component Value Date   VD25OH 59 12/11/2017   Current Outpatient Medications on File Prior to Visit  Medication Sig  . Ascorbic Acid (VITAMIN C PO) Take 1 tablet by mouth daily.   . B Complex Vitamins (VITAMIN B COMPLEX PO) Take by mouth.  . Cholecalciferol (VITAMIN D PO) Take 5,000 Units by mouth daily.   . Multiple Vitamins-Minerals (MULTIVITAMIN PO) Take 1 tablet by mouth daily.   . Omega-3 Fatty Acids (OMEGA 3 PO) Take 500 mg by mouth 2 (two) times daily.  Marland Kitchen OVER THE COUNTER MEDICATION Iodine 7 drops daily  . OVER THE COUNTER MEDICATION thytrophen  3 per day.  Marland Kitchen OVER THE COUNTER MEDICATION OsteoSheath 1224 mg daily.  Marland Kitchen OVER THE COUNTER MEDICATION CBD oil  . OVER THE COUNTER MEDICATION ASHWAGHANDA DROPS  . Probiotic  Product (PROBIOTIC DAILY PO) Take 1 tablet by mouth daily.   . Pyridoxine HCl (VITAMIN B-6) 500 MG tablet Take 500 mg by mouth daily.   No current facility-administered medications on file prior to visit.   Allergies  Allergen Reactions  . Codeine     Emotional-Pt reports that she feels like crying  . Gluten Meal   . Milk-Related Compounds   . Other     MSG and oats  . Sulfa Antibiotics     GI problems  . Iodine Rash    Patient states she had a rash with IV contrast   Past Medical History:  Diagnosis Date  . Tachycardia    Related to Epinephrine used for dental procedure   Health Maintenance  Topic Date Due  . Fecal DNA (Cologuard)  03/10/2021  . DEXA SCAN  Completed  . INFLUENZA VACCINE  Discontinued  . MAMMOGRAM  Discontinued  . TETANUS/TDAP  Discontinued  . Hepatitis C Screening  Discontinued  . PNA vac Low Risk Adult  Discontinued   Last Colon - age 49 yo and declines repeat.   Last MGM -   Past Surgical History:  Procedure Laterality Date  . colonscopy    . ORIF HUMERUS FRACTURE Right 01/20/2019   Procedure: OPEN REDUCTION INTERNAL FIXATION (ORIF) PROXIMAL HUMERUS FRACTURE;  Surgeon: Justice Britain, MD;  Location: [facility];  Service: Orthopedics;  Laterality: Right;  123min  . OVARIAN CYST  REMOVAL  1973   Family History  Problem Relation Age of Onset  . Leukemia Mother   . Goiter Mother   . Stroke Father   . Cancer Father   . Heart attack Father    Social History   Tobacco Use  . Smoking status: Never Smoker  . Smokeless tobacco: Never Used  Substance Use Topics  . Alcohol use: No  . Drug use: No    ROS Constitutional: Denies fever, chills, weight loss/gain, headaches, insomnia,  night sweats, and change in appetite. Does c/o fatigue. Eyes: Denies redness, blurred vision, diplopia, discharge, itchy, watery eyes.  ENT: Denies discharge, congestion, post nasal drip, epistaxis, sore throat, earache, hearing loss, dental pain, Tinnitus, Vertigo, Sinus  pain, snoring.  Cardio: Denies chest pain, palpitations, irregular heartbeat, syncope, dyspnea, diaphoresis, orthopnea, PND, claudication, edema Respiratory: denies cough, dyspnea, DOE, pleurisy, hoarseness, laryngitis, wheezing.  Gastrointestinal: Denies dysphagia, heartburn, reflux, water brash, pain, cramps, nausea, vomiting, bloating, diarrhea, constipation, hematemesis, melena, hematochezia, jaundice, hemorrhoids Genitourinary: Denies dysuria, frequency, urgency, nocturia, hesitancy, discharge, hematuria, flank pain Breast: Breast lumps, nipple discharge, bleeding.  Musculoskeletal: Denies arthralgia, myalgia, stiffness, Jt. Swelling, pain, limp, and strain/sprain. Denies falls. Skin: Denies puritis, rash, hives, warts, acne, eczema, changing in skin lesion Neuro: No weakness, tremor, incoordination, spasms, paresthesia, pain Psychiatric: Denies confusion, memory loss, sensory loss. Denies Depression. Endocrine: Denies change in weight, skin, hair change, nocturia, and paresthesia, diabetic polys, visual blurring, hyper / hypo glycemic episodes.  Heme/Lymph: No excessive bleeding, bruising, enlarged lymph nodes.  Physical Exam  BP 110/60   Pulse 68   Temp (!) 95.4 F (35.2 C)   Resp 16   Ht 5\' 2"  (1.575 m)   Wt 98 lb 3.2 oz (44.5 kg)   BMI 17.96 kg/m   General Appearance: Well nourished, well groomed and in no apparent distress.  Eyes: PERRLA, EOMs, conjunctiva no swelling or erythema, normal fundi and vessels. Sinuses: No frontal/maxillary tenderness ENT/Mouth: EACs patent / TMs  nl. Nares clear without erythema, swelling, mucoid exudates. Oral hygiene is good. No erythema, swelling, or exudate. Tongue normal, non-obstructing. Tonsils not swollen or erythematous. Hearing normal.  Neck: Supple, thyroid not palpable. No bruits, nodes or JVD. Respiratory: Respiratory effort normal.  BS equal and clear bilateral without rales, rhonci, wheezing or stridor. Cardio: Heart sounds are  normal with regular rate and rhythm and no murmurs, rubs or gallops. Peripheral pulses are normal and equal bilaterally without edema. No aortic or femoral bruits. Chest: symmetric with normal excursions and percussion. Breasts: Symmetric, without lumps, nipple discharge, retractions, or fibrocystic changes.  Abdomen: Flat, soft with bowel sounds active. Nontender, no guarding, rebound, hernias, masses, or organomegaly.  Lymphatics: Non tender without lymphadenopathy.  Genitourinary:  Musculoskeletal: Full ROM all peripheral extremities, joint stability, 5/5 strength, and normal gait. Skin: Warm and dry without rashes, lesions, cyanosis, clubbing or  ecchymosis.  Neuro: Cranial nerves intact, reflexes equal bilaterally. Normal muscle tone, no cerebellar symptoms. Sensation intact.  Pysch: Alert and oriented X 3, normal affect, Insight and Judgment appropriate.   Assessment and Plan  1. Annual Preventative Screening Examination  2. Labile hypertension  - EKG 12-Lead - Urinalysis, Routine w reflex microscopic - Microalbumin / Creatinine Urine Ratio - CBC with Diff - COMPLETE METABOLIC PANEL WITH GFR - Magnesium - TSH  3. Hyperlipidemia, mixed  - EKG 12-Lead - Lipid Profile - TSH  4. Abnormal glucose  - EKG 12-Lead - Hemoglobin A1c ([facility]) - Insulin, random  5. Vitamin D deficiency  -  Vitamin D (25 hydroxy)  6. Screening for colorectal cancer  - POC Hemoccult Bld/Stl  7. Screening for ischemic heart disease  - EKG 12-Lead  8. FHx: heart disease  - EKG 12-Lead  9. Medication management  - Urinalysis, Routine w reflex microscopic - Microalbumin / Creatinine Urine Ratio - CBC with Diff - COMPLETE METABOLIC PANEL WITH GFR - Magnesium - Lipid Profile - TSH - Hemoglobin A1c ([facility]) - Insulin, random - Vitamin D (25 hydroxy)        Patient was counseled in prudent diet to achieve/maintain BMI less than 25 for weight control, BP monitoring, regular exercise  and medications. Discussed med's effects and SE's. Screening labs and tests as requested with regular follow-up as recommended. Over 40 minutes of exam, counseling, chart review and high complex critical decision making was performed.   Kirtland Bouchard, MD

## 2019-05-02 ENCOUNTER — Other Ambulatory Visit: Payer: Self-pay

## 2019-05-02 ENCOUNTER — Ambulatory Visit (INDEPENDENT_AMBULATORY_CARE_PROVIDER_SITE_OTHER): Payer: PPO | Admitting: Internal Medicine

## 2019-05-02 VITALS — BP 110/60 | HR 68 | Temp 95.4°F | Resp 16 | Ht 62.0 in | Wt 98.2 lb

## 2019-05-02 DIAGNOSIS — R0989 Other specified symptoms and signs involving the circulatory and respiratory systems: Secondary | ICD-10-CM

## 2019-05-02 DIAGNOSIS — E559 Vitamin D deficiency, unspecified: Secondary | ICD-10-CM | POA: Diagnosis not present

## 2019-05-02 DIAGNOSIS — Z0000 Encounter for general adult medical examination without abnormal findings: Secondary | ICD-10-CM

## 2019-05-02 DIAGNOSIS — Z136 Encounter for screening for cardiovascular disorders: Secondary | ICD-10-CM

## 2019-05-02 DIAGNOSIS — Z1211 Encounter for screening for malignant neoplasm of colon: Secondary | ICD-10-CM

## 2019-05-02 DIAGNOSIS — E782 Mixed hyperlipidemia: Secondary | ICD-10-CM

## 2019-05-02 DIAGNOSIS — Z79899 Other long term (current) drug therapy: Secondary | ICD-10-CM

## 2019-05-02 DIAGNOSIS — Z8249 Family history of ischemic heart disease and other diseases of the circulatory system: Secondary | ICD-10-CM | POA: Diagnosis not present

## 2019-05-02 DIAGNOSIS — R7309 Other abnormal glucose: Secondary | ICD-10-CM

## 2019-05-02 DIAGNOSIS — Z0001 Encounter for general adult medical examination with abnormal findings: Secondary | ICD-10-CM

## 2019-05-03 LAB — MAGNESIUM: Magnesium: 2.6 mg/dL — ABNORMAL HIGH (ref 1.5–2.5)

## 2019-05-03 LAB — CBC WITH DIFFERENTIAL/PLATELET
Absolute Monocytes: 316 cells/uL (ref 200–950)
Basophils Absolute: 40 cells/uL (ref 0–200)
Basophils Relative: 1.3 %
Eosinophils Absolute: 59 cells/uL (ref 15–500)
Eosinophils Relative: 1.9 %
HCT: 39.1 % (ref 35.0–45.0)
Hemoglobin: 13.4 g/dL (ref 11.7–15.5)
Lymphs Abs: 853 cells/uL (ref 850–3900)
MCH: 32.1 pg (ref 27.0–33.0)
MCHC: 34.3 g/dL (ref 32.0–36.0)
MCV: 93.5 fL (ref 80.0–100.0)
MPV: 9.6 fL (ref 7.5–12.5)
Monocytes Relative: 10.2 %
Neutro Abs: 1832 cells/uL (ref 1500–7800)
Neutrophils Relative %: 59.1 %
Platelets: 209 10*3/uL (ref 140–400)
RBC: 4.18 10*6/uL (ref 3.80–5.10)
RDW: 11.7 % (ref 11.0–15.0)
Total Lymphocyte: 27.5 %
WBC: 3.1 10*3/uL — ABNORMAL LOW (ref 3.8–10.8)

## 2019-05-03 LAB — VITAMIN D 25 HYDROXY (VIT D DEFICIENCY, FRACTURES): Vit D, 25-Hydroxy: 118 ng/mL — ABNORMAL HIGH (ref 30–100)

## 2019-05-03 LAB — COMPLETE METABOLIC PANEL WITH GFR
AG Ratio: 2.4 (calc) (ref 1.0–2.5)
ALT: 15 U/L (ref 6–29)
AST: 26 U/L (ref 10–35)
Albumin: 4.7 g/dL (ref 3.6–5.1)
Alkaline phosphatase (APISO): 108 U/L (ref 37–153)
BUN: 10 mg/dL (ref 7–25)
CO2: 33 mmol/L — ABNORMAL HIGH (ref 20–32)
Calcium: 10.4 mg/dL (ref 8.6–10.4)
Chloride: 104 mmol/L (ref 98–110)
Creat: 0.74 mg/dL (ref 0.60–0.93)
GFR, Est African American: 93 mL/min/{1.73_m2} (ref >=60)
GFR, Est Non African American: 80 mL/min/{1.73_m2} (ref >=60)
Globulin: 2 g/dL (calc) (ref 1.9–3.7)
Glucose, Bld: 79 mg/dL (ref 65–99)
Potassium: 4.4 mmol/L (ref 3.5–5.3)
Sodium: 142 mmol/L (ref 135–146)
Total Bilirubin: 0.6 mg/dL (ref 0.2–1.2)
Total Protein: 6.7 g/dL (ref 6.1–8.1)

## 2019-05-03 LAB — MICROALBUMIN / CREATININE URINE RATIO
Creatinine, Urine: 17 mg/dL — ABNORMAL LOW (ref 20–275)
Microalb, Ur: <0.2 mg/dL

## 2019-05-03 LAB — URINALYSIS, ROUTINE W REFLEX MICROSCOPIC
Bilirubin Urine: NEGATIVE
Glucose, UA: NEGATIVE
Hgb urine dipstick: NEGATIVE
Ketones, ur: NEGATIVE
Leukocytes,Ua: NEGATIVE
Nitrite: NEGATIVE
Protein, ur: NEGATIVE
Specific Gravity, Urine: 1.005 (ref 1.001–1.03)
pH: 7.5 (ref 5.0–8.0)

## 2019-05-03 LAB — LIPID PANEL
Cholesterol: 217 mg/dL — ABNORMAL HIGH (ref <200)
HDL: 93 mg/dL (ref >=50)
LDL Cholesterol (Calc): 106 mg/dL (calc) — ABNORMAL HIGH
Non-HDL Cholesterol (Calc): 124 mg/dL (calc) (ref <130)
Total CHOL/HDL Ratio: 2.3 (calc) (ref <5.0)
Triglycerides: 90 mg/dL (ref <150)

## 2019-05-03 LAB — HEMOGLOBIN A1C
Hgb A1c MFr Bld: 5.3 % of total Hgb (ref <5.7)
Mean Plasma Glucose: 105 (calc)
eAG (mmol/L): 5.8 (calc)

## 2019-05-03 LAB — TSH: TSH: 1.48 mIU/L (ref 0.40–4.50)

## 2019-05-03 LAB — INSULIN, RANDOM: Insulin: 3.6 u[IU]/mL

## 2019-05-16 DIAGNOSIS — Z4789 Encounter for other orthopedic aftercare: Secondary | ICD-10-CM | POA: Diagnosis not present

## 2019-05-16 DIAGNOSIS — S42211A Unspecified displaced fracture of surgical neck of right humerus, initial encounter for closed fracture: Secondary | ICD-10-CM | POA: Diagnosis not present

## 2019-07-04 ENCOUNTER — Other Ambulatory Visit: Payer: Self-pay | Admitting: Internal Medicine

## 2019-07-04 ENCOUNTER — Other Ambulatory Visit: Payer: Self-pay

## 2019-07-04 ENCOUNTER — Ambulatory Visit (INDEPENDENT_AMBULATORY_CARE_PROVIDER_SITE_OTHER): Payer: PPO | Admitting: *Deleted

## 2019-07-04 VITALS — BP 100/66 | HR 68 | Temp 97.0°F | Resp 16 | Wt 98.8 lb

## 2019-07-04 DIAGNOSIS — R300 Dysuria: Secondary | ICD-10-CM

## 2019-07-04 NOTE — Progress Notes (Signed)
Patient is here for a NV to check a UA and culture. She complained of urinary urgency and frequency, with an aching feeling since last week. She did not complain of painful urination.

## 2019-07-05 LAB — URINE CULTURE
MICRO NUMBER:: 10278508
Result:: NO GROWTH
SPECIMEN QUALITY:: ADEQUATE

## 2019-07-05 LAB — URINALYSIS, ROUTINE W REFLEX MICROSCOPIC
Bilirubin Urine: NEGATIVE
Glucose, UA: NEGATIVE
Hgb urine dipstick: NEGATIVE
Ketones, ur: NEGATIVE
Leukocytes,Ua: NEGATIVE
Nitrite: NEGATIVE
Protein, ur: NEGATIVE
Specific Gravity, Urine: 1.006 (ref 1.001–1.03)
pH: 7 (ref 5.0–8.0)

## 2019-08-15 NOTE — Progress Notes (Deleted)
MEDICARE ANNUAL WELLNESS VISIT AND 3 MONTH FOLLOW UP Assessment:   Diagnoses and all orders for this visit:  Encounter for Medicare annual wellness exam Yearly  Labile hypertension Doing well Monitor blood pressure in office Monitors diet, decreased sodium Will continue to monitor  Hyperlipidemia, mixed Taking Omega-3, 2,000mg  daily Discussed dietary and exercise modifications  Age related osteoporosis, unspecified pathological fracture presence Taking Calcium and Vitamin D Supplementation Participates in strengthening and stretching exercises at home Dexa screening   Vitamin D deficiency Doing well Continue supplementation    Follow Up Instructions:    I discussed the assessment and treatment plan with the patient. The patient was provided an opportunity to ask questions and all were answered. The patient agreed with the plan and demonstrated an understanding of the instructions.   The patient was advised to call back or seek an in-person evaluation if the symptoms worsen or if the condition fails to improve as anticipated.  I provided 30 minutes of non-face-to-face time during this encounter including counseling, chart review, and critical decision making was preformed.   Future Appointments  Date Time Provider [place]  08/16/2019 10:00 AM Garnet Sierras, NP [facility] None  05/11/2020  9:00 AM [name], MD [facility] None      Plan:   During the course of the visit the patient was educated and counseled about appropriate screening and preventive services including:    Pneumococcal vaccine   Influenza vaccine  Prevnar 13  Td vaccine  Screening electrocardiogram, deferred, telephone visit [place].  Colorectal cancer screening  Diabetes screening  Glaucoma screening  Nutrition counseling    Subjective:  Tricia [name] is a 74 y.o. female who presents for Medicare Annual Wellness Visit and 3 month follow up for labile HTN,  hyperlipidemia, osteoporosis, and vitamin D Def. Reports she has been doing well since last visit and no health concerns today.  Patient does not have resources to check blood pressure or temperature at home.  Her blood pressure has been controlled at home, today their BP is   She does workout. She denies chest pain, shortness of breath, dizziness.  She is not on cholesterol medication and denies myalgias. Her cholesterol is at goal. The cholesterol last visit was:   Lab Results  Component Value Date   CHOL 217 (H) 05/02/2019   HDL 93 05/02/2019   LDLCALC 106 (H) 05/02/2019   TRIG 90 05/02/2019   CHOLHDL 2.3 05/02/2019    Last GFR Lab Results  Component Value Date   GFRNONAA 80 05/02/2019     Lab Results  Component Value Date   GFRAA 93 05/02/2019   Patient is on Vitamin D supplement.   Lab Results  Component Value Date   VD25OH 118 (H) 05/02/2019      Medication Review:       Current Outpatient Medications (Other):  Marland Kitchen  Ascorbic Acid (VITAMIN C PO), Take 1 tablet by mouth daily.  .  B Complex Vitamins (VITAMIN B COMPLEX PO), Take by mouth. .  Cholecalciferol (VITAMIN D PO), Take 5,000 Units by mouth daily.  .  Multiple Vitamins-Minerals (MULTIVITAMIN PO), Take 1 tablet by mouth daily.  .  Omega-3 Fatty Acids (OMEGA 3 PO), Take 500 mg by mouth 2 (two) times daily. Marland Kitchen  OVER THE COUNTER MEDICATION, Iodine 7 drops daily .  OVER THE COUNTER MEDICATION, thytrophen  3 per day. Marland Kitchen  OVER THE COUNTER MEDICATION, OsteoSheath 1224 mg daily. Marland Kitchen  OVER THE COUNTER MEDICATION, CBD oil .  OVER THE COUNTER MEDICATION, ASHWAGHANDA DROPS .  Probiotic Product (PROBIOTIC DAILY PO), Take 1 tablet by mouth daily.  .  Pyridoxine HCl (VITAMIN B-6) 500 MG tablet, Take 500 mg by mouth daily.  Allergies: Allergies  Allergen Reactions  . Codeine     Emotional-Pt reports that she feels like crying  . Gluten Meal   . Milk-Related Compounds   . Other     MSG and oats  . Sulfa Antibiotics      GI problems  . Iodine Rash    Patient states she had a rash with IV contrast    Current Problems (verified) has Fatigue; Vitamin D deficiency; Osteoporosis; FHx: heart disease; Hyperlipidemia, mixed; Labile hypertension; and Proximal humerus fracture on their problem list.  Screening Tests  There is no immunization history on file for this patient.  Preventative care: Last colonoscopy: remote Mammogram 2018 [name] DUE  DEXA: 2019, T-4.0, 2017 T-4.1, DUE  Prior vaccinations: DECLINES ALL VACCINATIONS   Names of Other Physician/Practitioners you currently use: 1. [facility] here for primary care 2. Dr. Dallie Piles, 2019, DUE 2020 3. Dr Pablo Lawrence, 2019, DUE 2020  Patient Care Team: [name], MD as PCP - General (Internal Medicine) Paula Compton, MD as Consulting Physician (Obstetrics and Gynecology) Richmond Campbell, MD as Consulting Physician (Gastroenterology)  Surgical: She  has a past surgical history that includes Ovarian cyst removal (1973); colonscopy; and ORIF humerus fracture (Right, 01/20/2019). Family Her family history includes Cancer in her father; Goiter in her mother; Heart attack in her father; Leukemia in her mother; Stroke in her father. Social history  She reports that she has never smoked. She has never used smokeless tobacco. She reports that she does not drink alcohol or use drugs.  MEDICARE WELLNESS OBJECTIVES: Physical activity:   Cardiac risk factors:   Depression/mood screen:   Depression screen [facility] 2/9 05/01/2019  Decreased Interest 0  Down, Depressed, Hopeless 0  PHQ - 2 Score 0  Altered sleeping -  Tired, decreased energy -  Change in appetite -  Feeling bad or failure about yourself  -  Trouble concentrating -  Moving slowly or fidgety/restless -  Suicidal thoughts -  PHQ-9 Score -    ADLs:  In your present state of health, do you have any difficulty performing the following activities:  05/01/2019 01/20/2019  Hearing? N -  Vision? N -  Difficulty concentrating or making decisions? N -  Walking or climbing stairs? N -  Dressing or bathing? N -  Doing errands, shopping? N N  Some recent data might be hidden     Cognitive Testing  Alert? Yes  Normal Appearance?Yes  Oriented to person? Yes  Place? Yes   Time? Yes  Recall of three objects?  Yes  Can perform simple calculations? Yes  Displays appropriate judgment?Yes  Can read the correct time from a watch face?Yes  EOL planning:     Objective:   There were no vitals filed for this visit. There is no height or weight on file to calculate BMI.  General : Well sounding patient in no apparent distress HEENT: no hoarseness, no cough for duration of visit Lungs: speaks in complete sentences, no audible wheezing, no apparent distress Neurological: alert, oriented x 3 Psychiatric: pleasant, judgement appropriate   Medicare Attestation I have personally reviewed: The patient's medical and social history Their use of alcohol, tobacco or illicit drugs Their current medications and supplements The patient's functional ability including ADLs,fall risks, home  safety risks, cognitive, and hearing and visual impairment Diet and physical activities Evidence for depression or mood disorders  The patient's weight, height, BMI, and visual acuity have been recorded in the chart.  I have made referrals, counseling, and provided education to the patient based on review of the above and I have provided the patient with a written personalized care plan for preventive services.     Garnet Sierras, NP [facility] 08/06/2018  1:00 PM

## 2019-08-16 ENCOUNTER — Ambulatory Visit: Payer: PPO | Admitting: Adult Health Nurse Practitioner

## 2019-08-16 DIAGNOSIS — M810 Age-related osteoporosis without current pathological fracture: Secondary | ICD-10-CM

## 2019-08-16 DIAGNOSIS — Z79899 Other long term (current) drug therapy: Secondary | ICD-10-CM

## 2019-08-16 DIAGNOSIS — E782 Mixed hyperlipidemia: Secondary | ICD-10-CM

## 2019-08-16 DIAGNOSIS — E559 Vitamin D deficiency, unspecified: Secondary | ICD-10-CM

## 2019-08-16 DIAGNOSIS — R0989 Other specified symptoms and signs involving the circulatory and respiratory systems: Secondary | ICD-10-CM

## 2019-08-16 DIAGNOSIS — Z0000 Encounter for general adult medical examination without abnormal findings: Secondary | ICD-10-CM

## 2019-08-30 DIAGNOSIS — M25562 Pain in left knee: Secondary | ICD-10-CM | POA: Diagnosis not present

## 2019-08-30 DIAGNOSIS — S82032A Displaced transverse fracture of left patella, initial encounter for closed fracture: Secondary | ICD-10-CM | POA: Diagnosis not present

## 2019-08-31 ENCOUNTER — Encounter: Payer: Self-pay | Admitting: Adult Health

## 2019-08-31 NOTE — Progress Notes (Deleted)
MEDICARE ANNUAL WELLNESS VISIT AND FOLLOW UP Assessment:   Diagnoses and all orders for this visit:  Encounter for Medicare annual wellness exam Yearly  Labile hypertension Doing well Monitor blood pressure in office Monitors diet, decreased sodium Will continue to monitor  Hyperlipidemia, mixed Taking Omega-3, 2,000mg  daily Discussed dietary and exercise modifications  Age related osteoporosis, unspecified pathological fracture presence Taking Calcium *** and Vitamin D Supplementation Participates in strengthening and stretching exercises at home *** Dexa screening due 02/2020 ***  Vitamin D deficiency Above goal last check *** Continue supplementation ***     Future Appointments  Date Time Provider [place]  09/01/2019  9:00 AM Liane Comber, NP [facility] None  05/24/2020  3:00 PM [name], MD [facility] None      Plan:   During the course of the visit the patient was educated and counseled about appropriate screening and preventive services including:    Pneumococcal vaccine   Influenza vaccine  Prevnar 13  Td vaccine  Screening electrocardiogram, deferred, telephone visit [place].  Colorectal cancer screening  Diabetes screening  Glaucoma screening  Nutrition counseling    Subjective:  Tricia [name] is a 74 y.o. female who presents for Medicare Annual Wellness Visit and 3 month follow up for labile HTN, hyperlipidemia, osteoporosis, and vitamin D Def.   Recently underwent ORIF R proximal humerus fracture on 01/20/2019 by Dr. Onnie Graham. Notably with osteopenia, last DEXA 02/2018 showed R fem T score of -4.0, has been declining bisphosphonates ***  Dx fatigue ? ***  BMI is There is no height or weight on file to calculate BMI., she {HAS HAS [id number] been working on diet and exercise. Wt Readings from Last 3 Encounters:  07/04/19 98 lb 12.8 oz (44.8 kg)  05/02/19 98 lb 3.2 oz (44.5 kg)  04/26/19 102 lb (46.3 kg)   Her  blood pressure has been controlled at home, today their BP is   She does workout. She denies chest pain, shortness of breath, dizziness.   She is not on cholesterol medication and denies myalgias. Her cholesterol is not at goal. The cholesterol last visit was:   Lab Results  Component Value Date   CHOL 217 (H) 05/02/2019   HDL 93 05/02/2019   LDLCALC 106 (H) 05/02/2019   TRIG 90 05/02/2019   CHOLHDL 2.3 05/02/2019   Last GFR Lab Results  Component Value Date   GFRNONAA 80 05/02/2019    Patient is on Vitamin D supplement.   Lab Results  Component Value Date   VD25OH 118 (H) 05/02/2019      Medication Review:       Current Outpatient Medications (Other):  Marland Kitchen  Ascorbic Acid (VITAMIN C PO), Take 1 tablet by mouth daily.  .  B Complex Vitamins (VITAMIN B COMPLEX PO), Take by mouth. .  Cholecalciferol (VITAMIN D PO), Take 5,000 Units by mouth daily.  .  Multiple Vitamins-Minerals (MULTIVITAMIN PO), Take 1 tablet by mouth daily.  .  Omega-3 Fatty Acids (OMEGA 3 PO), Take 500 mg by mouth 2 (two) times daily. Marland Kitchen  OVER THE COUNTER MEDICATION, Iodine 7 drops daily .  OVER THE COUNTER MEDICATION, thytrophen  3 per day. Marland Kitchen  OVER THE COUNTER MEDICATION, OsteoSheath 1224 mg daily. Marland Kitchen  OVER THE COUNTER MEDICATION, CBD oil .  OVER THE COUNTER MEDICATION, ASHWAGHANDA DROPS .  Probiotic Product (PROBIOTIC DAILY PO), Take 1 tablet by mouth daily.  .  Pyridoxine HCl (VITAMIN B-6) 500 MG tablet, Take 500 mg by mouth daily.  Allergies: Allergies  Allergen Reactions  . Codeine     Emotional-Pt reports that she feels like crying  . Gluten Meal   . Milk-Related Compounds   . Other     MSG and oats  . Sulfa Antibiotics     GI problems  . Iodine Rash    Patient states she had a rash with IV contrast    Current Problems (verified) has Fatigue; Vitamin D deficiency; Osteoporosis; FHx: heart disease; Hyperlipidemia, mixed; and Labile hypertension on their problem list.  Screening  Tests  There is no immunization history on file for this patient.  Preventative care: Last colonoscopy: remote Mammogram 2018 Q2years, [place] *** DEXA: 02/2018, T-4.0, 2017 T-4.1, *** has been declining bisphosphonate   Prior vaccinations: DECLINES ALL VACCINATIONS   Names of Other Physician/Practitioners you currently use: 1. [facility] here for primary care 2. Dr. Dallie Piles, vision, last 2019 3. Dr Pablo Lawrence, dental, last 2019  Patient Care Team: [name], MD as PCP - General (Internal Medicine) Paula Compton, MD as Consulting Physician (Obstetrics and Gynecology) Richmond Campbell, MD as Consulting Physician (Gastroenterology)  Surgical: She  has a past surgical history that includes Ovarian cyst removal (1973); colonscopy; and ORIF humerus fracture (Right, 01/20/2019). Family Her family history includes Cancer in her father; Goiter in her mother; Heart attack in her father; Leukemia in her mother; Stroke in her father. Social history  She reports that she has never smoked. She has never used smokeless tobacco. She reports that she does not drink alcohol or use drugs.  MEDICARE WELLNESS OBJECTIVES: Physical activity:   Cardiac risk factors:   Depression/mood screen:   Depression screen [facility] 2/9 05/01/2019  Decreased Interest 0  Down, Depressed, Hopeless 0  PHQ - 2 Score 0  Altered sleeping -  Tired, decreased energy -  Change in appetite -  Feeling bad or failure about yourself  -  Trouble concentrating -  Moving slowly or fidgety/restless -  Suicidal thoughts -  PHQ-9 Score -    ADLs:  In your present state of health, do you have any difficulty performing the following activities: 05/01/2019 01/20/2019  Hearing? N -  Vision? N -  Difficulty concentrating or making decisions? N -  Walking or climbing stairs? N -  Dressing or bathing? N -  Doing errands, shopping? N N  Some recent data might be hidden     Cognitive  Testing  Alert? Yes  Normal Appearance?Yes  Oriented to person? Yes  Place? Yes   Time? Yes  Recall of three objects?  Yes  Can perform simple calculations? Yes  Displays appropriate judgment?Yes  Can read the correct time from a watch face?Yes  EOL planning:     Objective:   There were no vitals filed for this visit. There is no height or weight on file to calculate BMI.  General appearance: alert, no distress, WD/WN,  female HEENT: normocephalic, sclerae anicteric, TMs pearly, nares patent, no discharge or erythema, pharynx normal Oral cavity: MMM, no lesions Neck: supple, no lymphadenopathy, no thyromegaly, no masses Heart: RRR, normal S1, S2, no murmurs Lungs: CTA bilaterally, no wheezes, rhonchi, or rales Abdomen: +bs, soft, non tender, non distended, no masses, no hepatomegaly, no splenomegaly Musculoskeletal: nontender, no swelling, no obvious deformity Extremities: no edema, no cyanosis, no clubbing Pulses: 2+ symmetric, upper and lower extremities, normal cap refill Neurological: alert, oriented x 3, CN2-12 intact, strength normal upper extremities and lower extremities, sensation normal throughout, DTRs 2+ throughout, no  cerebellar signs, gait normal Psychiatric: normal affect, behavior normal, pleasant   Medicare Attestation I have personally reviewed: The patient's medical and social history Their use of alcohol, tobacco or illicit drugs Their current medications and supplements The patient's functional ability including ADLs,fall risks, home safety risks, cognitive, and hearing and visual impairment Diet and physical activities Evidence for depression or mood disorders  The patient's weight, height, BMI, and visual acuity have been recorded in the chart.  I have made referrals, counseling, and provided education to the patient based on review of the above and I have provided the patient with a written personalized care plan for preventive services.     Garnet Sierras, NP [facility] 08/06/2018  1:00 PM

## 2019-09-01 ENCOUNTER — Ambulatory Visit: Payer: PPO | Admitting: Adult Health

## 2019-09-06 DIAGNOSIS — M25562 Pain in left knee: Secondary | ICD-10-CM | POA: Diagnosis not present

## 2019-09-06 DIAGNOSIS — S82002A Unspecified fracture of left patella, initial encounter for closed fracture: Secondary | ICD-10-CM | POA: Diagnosis not present

## 2019-09-08 DIAGNOSIS — M25562 Pain in left knee: Secondary | ICD-10-CM | POA: Diagnosis not present

## 2019-09-16 DIAGNOSIS — M25562 Pain in left knee: Secondary | ICD-10-CM | POA: Diagnosis not present

## 2019-09-29 DIAGNOSIS — M25562 Pain in left knee: Secondary | ICD-10-CM | POA: Diagnosis not present

## 2019-10-04 DIAGNOSIS — S82002A Unspecified fracture of left patella, initial encounter for closed fracture: Secondary | ICD-10-CM | POA: Diagnosis not present

## 2019-10-25 NOTE — Progress Notes (Signed)
MEDICARE ANNUAL WELLNESS VISIT AND FOLLOW UP Assessment:   Diagnoses and all orders for this visit:  Encounter for Medicare annual wellness exam Yearly  Labile hypertension Doing well Monitor blood pressure in office Monitors diet, decreased sodium Will continue to monitor  Hyperlipidemia, mixed Taking Omega-3, 2,000mg  daily Discussed dietary and exercise modifications Mild elevations managed by lifestyle Check lipid panel   Age related osteoporosis, unspecified pathological fracture presence Taking Calcium and Vitamin D Supplementation, discussed vitamin K Participates in strengthening and stretching exercises at home - resume once knee allows Dexa follow up due 02/2020 - declines this year, will plan to get in 2022  Vitamin D deficiency Was above goal last visit - has reduced from 10000 IU to 5000 IU Continue supplementation, check levels at CPE   Urinary frequency UTI versus OAB versus vaginal dryness - will check UA, C&S.    Follow Up Instructions:  I discussed the assessment and treatment plan with the patient. The patient was provided an opportunity to ask questions and all were answered. The patient agreed with the plan and demonstrated an understanding of the instructions.   The patient was advised to call back or seek an in-person evaluation if the symptoms worsen or if the condition fails to improve as anticipated.  I provided 30 minutes of non-face-to-face time during this encounter including counseling, chart review, and critical decision making was preformed.   Future Appointments  Date Time Provider [place]  05/24/2020  3:00 PM [name], MD [facility] None      Plan:   During the course of the visit the patient was educated and counseled about appropriate screening and preventive services including:    Pneumococcal vaccine   Influenza vaccine  Prevnar 13  Td vaccine  Screening electrocardiogram, deferred, telephone  visit [place].  Colorectal cancer screening  Diabetes screening  Glaucoma screening  Nutrition counseling    Subjective:  Tricia [name] is a 74 y.o. female who presents for Medicare Annual Wellness Visit and 3 month follow up for labile HTN, hyperlipidemia, osteoporosis, and vitamin D Def.   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   Had fall in May, fractures L patella, bracing only, using support and crutches as needed, Dr. Stann Mainland is following, Dr.   She reports having some urinary frequency, worse than usual yesterday, didn't make it to to the bathroom 3 times, denies dysuria, urine character changes, requesting UTI check   She had DEXA in 02/2018 which showed R fem T -4.0, was recommended fosamax but ultimately declined, doing calcium and vitamin D, was doing taichi videos 5 days a week until recent patellar fracture  BMI is Body mass index is 17.45 kg/m., she has been working on diet and exercise. Wt Readings from Last 3 Encounters:  10/26/19 95 lb 6.4 oz (43.3 kg)  07/04/19 98 lb 12.8 oz (44.8 kg)  05/02/19 98 lb 3.2 oz (44.5 kg)   Her blood pressure has been controlled at home, today their BP is BP: 110/60 She does workout. She denies chest pain, shortness of breath, dizziness.   She is not on cholesterol medication and denies myalgias. Her cholesterol is at goal. The cholesterol last visit was:   Lab Results  Component Value Date   CHOL 217 (H) 05/02/2019   HDL 93 05/02/2019   LDLCALC 106 (H) 05/02/2019   TRIG 90 05/02/2019   CHOLHDL 2.3 05/02/2019   Last GFR Lab Results  Component Value Date   [facility]  80 05/02/2019   Patient is on Vitamin D supplement.  Was above goal, was taking 10000 IU and has reduced to taking 5000 IU  Lab Results  Component Value Date   VD25OH 118 (H) 05/02/2019      Medication Review:       Current Outpatient Medications (Other):  Marland Kitchen  Ascorbic Acid (VITAMIN C PO), Take 1 tablet by mouth daily.  .  B Complex  Vitamins (VITAMIN B COMPLEX PO), Take by mouth. .  calcium gluconate 500 MG tablet, Take 1 tablet by mouth 2 (two) times daily. .  Cholecalciferol (VITAMIN D PO), Take 5,000 Units by mouth daily.  .  Multiple Vitamins-Minerals (MULTIVITAMIN PO), Take 1 tablet by mouth daily.  .  Omega-3 Fatty Acids (OMEGA 3 PO), Take 500 mg by mouth 2 (two) times daily. Marland Kitchen  OVER THE COUNTER MEDICATION, Iodine 7 drops daily .  OVER THE COUNTER MEDICATION, thytrophen  3 per day. Marland Kitchen  OVER THE COUNTER MEDICATION, OsteoSheath 1224 mg daily. Marland Kitchen  OVER THE COUNTER MEDICATION, CBD oil .  OVER THE COUNTER MEDICATION, ASHWAGHANDA DROPS .  Probiotic Product (PROBIOTIC DAILY PO), Take 1 tablet by mouth daily.  .  Pyridoxine HCl (VITAMIN B-6) 500 MG tablet, Take 500 mg by mouth daily.  Allergies: Allergies  Allergen Reactions  . Codeine     Emotional-Pt reports that she feels like crying  . Gluten Meal   . Milk-Related Compounds   . Other     MSG and oats  . Sulfa Antibiotics     GI problems  . Iodine Rash    Patient states she had a rash with IV contrast    Current Problems (verified) has Vitamin D deficiency; Osteoporosis; FHx: heart disease; Hyperlipidemia, mixed; and Labile hypertension on their problem list.  Screening Tests  There is no immunization history on file for this patient.  Preventative care: Last colonoscopy: remote Cologuard: 02/2018  Mammogram 2018 [name] - patient has declined further mammograms  DEXA: 02/2018, T-4.0 R fem, 2017 T-4.1, declined fosamax - declines this year - plan to get 2022  Prior vaccinations: DECLINES ALL VACCINATIONS  Names of Other Physician/Practitioners you currently use: 1. [facility] here for primary care 2. Dr. [place], 2020, monitoring mild cataracts, wears glasses 3. Dr Pablo Lawrence, 2021  Patient Care Team: [name], MD as PCP - General (Internal Medicine) Paula Compton, MD as Consulting Physician  (Obstetrics and Gynecology) Richmond Campbell, MD as Consulting Physician (Gastroenterology)  Surgical: She  has a past surgical history that includes Ovarian cyst removal (1973); colonscopy; and ORIF humerus fracture (Right, 01/20/2019). Family Her family history includes Cancer in her father; Goiter in her mother; Heart attack in her father; Leukemia in her mother; Stroke in her father. Social history  She reports that she has never smoked. She has never used smokeless tobacco. She reports that she does not drink alcohol and does not use drugs.  MEDICARE WELLNESS OBJECTIVES: Physical activity: Current Exercise Habits: Home exercise routine, Type of exercise: strength training/weights, Time (Minutes): 10, Frequency (Times/Week): 7, Weekly Exercise (Minutes/Week): 70, Intensity: Mild, Exercise limited by: orthopedic condition(s) Cardiac risk factors: Cardiac Risk Factors include: advanced age (>41men, >24 women);dyslipidemia;smoking/ tobacco exposure Depression/mood screen:   Depression screen [facility] 2/9 10/26/2019  Decreased Interest 0  Down, Depressed, Hopeless 0  PHQ - 2 Score 0  Altered sleeping -  Tired, decreased energy -  Change in appetite -  Feeling bad or failure about yourself  -  Trouble concentrating -  Moving slowly or fidgety/restless -  Suicidal thoughts -  PHQ-9 Score -    ADLs:  In your present state of health, do you have any difficulty performing the following activities: 10/26/2019 05/01/2019  Hearing? N N  Vision? N N  Difficulty concentrating or making decisions? N N  Walking or climbing stairs? N N  Comment 3-4 steps, has a handrail -  Dressing or bathing? N N  Doing errands, shopping? N N  Some recent data might be hidden     Cognitive Testing  Alert? Yes  Normal Appearance?Yes  Oriented to person? Yes  Place? Yes   Time? Yes  Recall of three objects?  Yes  Can perform simple calculations? Yes  Displays appropriate judgment?Yes  Can read the correct time  from a watch face?Yes  EOL planning: Does Patient Have a Medical Advance Directive?: No Would patient like information on creating a medical advance directive?: Yes (MAU/Ambulatory/Procedural Areas - Information given)   Objective:   Today's Vitals   10/26/19 1135  BP: 110/60  Pulse: (!) 58  Temp: (!) 95.5 F (35.3 C)  SpO2: 99%  Weight: 95 lb 6.4 oz (43.3 kg)  Height: 5\' 2"  (1.575 m)   Body mass index is 17.45 kg/m.  General appearance: alert, no distress, WD/WN,  female HEENT: normocephalic, sclerae anicteric, TMs pearly, nares patent, no discharge or erythema, pharynx normal Oral cavity: MMM, no lesions Neck: supple, no lymphadenopathy, no thyromegaly, no masses Heart: RRR, normal S1, S2, no murmurs Lungs: CTA bilaterally, no wheezes, rhonchi, or rales Abdomen: +bs, soft, non tender, non distended, no masses, no hepatomegaly, no splenomegaly Musculoskeletal: L knee with some tenderness over patella and ligaments, no swelling, no obvious deformity Extremities: no edema, no cyanosis, no clubbing Pulses: 2+ symmetric, upper and lower extremities, normal cap refill Neurological: alert, oriented x 3, CN2-12 intact, strength normal upper extremities and lower extremities, sensation normal throughout, DTRs 2+ throughout, no cerebellar signs, gait is slow with crutches Psychiatric: normal affect, behavior normal, pleasant  Breast: defer Gyn: defer Rectal: defer  Medicare Attestation I have personally reviewed: The patient's medical and social history Their use of alcohol, tobacco or illicit drugs Their current medications and supplements The patient's functional ability including ADLs,fall risks, home safety risks, cognitive, and hearing and visual impairment Diet and physical activities Evidence for depression or mood disorders  The patient's weight, height, BMI, and visual acuity have been recorded in the chart.  I have made referrals, counseling, and provided education to  the patient based on review of the above and I have provided the patient with a written personalized care plan for preventive services.     Izora Ribas, NP 12:34 PM [facility]

## 2019-10-26 ENCOUNTER — Other Ambulatory Visit: Payer: Self-pay

## 2019-10-26 ENCOUNTER — Ambulatory Visit (INDEPENDENT_AMBULATORY_CARE_PROVIDER_SITE_OTHER): Payer: PPO | Admitting: Adult Health

## 2019-10-26 ENCOUNTER — Encounter: Payer: Self-pay | Admitting: Adult Health

## 2019-10-26 VITALS — BP 110/60 | HR 58 | Temp 95.5°F | Ht 62.0 in | Wt 95.4 lb

## 2019-10-26 DIAGNOSIS — R6889 Other general symptoms and signs: Secondary | ICD-10-CM

## 2019-10-26 DIAGNOSIS — E782 Mixed hyperlipidemia: Secondary | ICD-10-CM

## 2019-10-26 DIAGNOSIS — Z0001 Encounter for general adult medical examination with abnormal findings: Secondary | ICD-10-CM

## 2019-10-26 DIAGNOSIS — Z0000 Encounter for general adult medical examination without abnormal findings: Secondary | ICD-10-CM

## 2019-10-26 DIAGNOSIS — M810 Age-related osteoporosis without current pathological fracture: Secondary | ICD-10-CM | POA: Diagnosis not present

## 2019-10-26 DIAGNOSIS — D72819 Decreased white blood cell count, unspecified: Secondary | ICD-10-CM | POA: Diagnosis not present

## 2019-10-26 DIAGNOSIS — E559 Vitamin D deficiency, unspecified: Secondary | ICD-10-CM

## 2019-10-26 DIAGNOSIS — R350 Frequency of micturition: Secondary | ICD-10-CM | POA: Diagnosis not present

## 2019-10-26 DIAGNOSIS — R0989 Other specified symptoms and signs involving the circulatory and respiratory systems: Secondary | ICD-10-CM | POA: Diagnosis not present

## 2019-10-26 NOTE — Patient Instructions (Addendum)
[name] , Thank you for taking time to come for your Medicare Wellness Visit. I appreciate your ongoing commitment to your health goals. Please review the following plan we discussed and let me know if I can assist you in the future.   These are the goals we discussed: Goals    . DIET - INCREASE WATER INTAKE     65+ fluid ounces daily     . Exercise 150 min/wk Moderate Activity     Weight bearing/high impact exercises every other day for bone health       This is a list of the screening recommended for you and due dates:  Health Maintenance  Topic Date Due  . COVID-19 Vaccine (1) Never done  . Cologuard (Stool DNA test)  03/10/2021  . DEXA scan (bone density measurement)  Completed  . Flu Shot  Discontinued  . Mammogram  Discontinued  . Tetanus Vaccine  Discontinued  .  Hepatitis C: One time screening is recommended by Center for Disease Control  (CDC) for  adults born from [birth year] through 1965.   Discontinued  . Pneumonia vaccines  Discontinued    Recommend 300-600 mg calcium daily  Vitamin K is also good with vitamin D for healthy bones  Daily 15-20 min of weighted or body weight exercises - pilates, tai chi, even better if you add weights or increase resistance/diffictuly - need to "stress out" bones to teach them to get stronger    Osteoporosis  Osteoporosis is thinning and loss of density in your bones. Osteoporosis makes bones more brittle and fragile and more likely to break (fracture). Over time, osteoporosis can cause your bones to become so weak that they fracture after a minor fall. Bones in the hip, wrist, and spine are most likely to fracture due to osteoporosis. What are the causes? The exact cause of this condition is not known. What increases the risk? You may be at greater risk for osteoporosis if you:  Have a family history of the condition.  Have poor nutrition.  Use steroid medicines, such as prednisone.  Are female.  Are age 86 or  older.  Smoke or have a history of smoking.  Are not physically active (are sedentary).  Are white (Caucasian) or of Asian descent.  Have a small body frame.  Take certain medicines, such as antiseizure medicines. What are the signs or symptoms? A fracture might be the first sign of osteoporosis, especially if the fracture results from a fall or injury that usually would not cause a bone to break. Other signs and symptoms include:  Pain in the neck or low back.  Stooped posture.  Loss of height. How is this diagnosed? This condition may be diagnosed based on:  Your medical history.  A physical exam.  A bone mineral density test, also called a DXA or DEXA test (dual-energy X-ray absorptiometry test). This test uses X-rays to measure the amount of minerals in your bones. How is this treated? The goal of treatment is to strengthen your bones and lower your risk for a fracture. Treatment may involve:  Making lifestyle changes, such as: ? Including foods with more calcium and vitamin D in your diet. ? Doing weight-bearing and muscle-strengthening exercises. ? Stopping tobacco use. ? Limiting alcohol intake.  Taking medicine to slow the process of bone loss or to increase bone density.  Taking daily supplements of calcium and vitamin D.  Taking hormone replacement medicines, such as estrogen for women and testosterone for men.  Monitoring your levels of calcium and vitamin D. Follow these instructions at home:  Activity  Exercise as told by your health care provider. Ask your health care provider what exercises and activities are safe for you. You should do: ? Exercises that make you work against gravity (weight-bearing exercises), such as tai chi, yoga, or walking. ? Exercises to strengthen muscles, such as lifting weights. Lifestyle  Limit alcohol intake to no more than 1 drink a day for nonpregnant women and 2 drinks a day for men. One drink equals 12 oz of beer, 5 oz  of wine, or 1 oz of hard liquor.  Do not use any products that contain nicotine or tobacco, such as cigarettes and e-cigarettes. If you need help quitting, ask your health care provider. Preventing falls  Use devices to help you move around (mobility aids) as needed, such as canes, walkers, scooters, or crutches.  Keep rooms well-lit and clutter-free.  Remove tripping hazards from walkways, including cords and throw rugs.  Install grab bars in bathrooms and safety rails on stairs.  Use rubber mats in the bathroom and other areas that are often wet or slippery.  Wear closed-toe shoes that fit well and support your feet. Wear shoes that have rubber soles or low heels.  Review your medicines with your health care provider. Some medicines can cause dizziness or changes in blood pressure, which can increase your risk of falling. General instructions  Include calcium and vitamin D in your diet. Calcium is important for bone health, and vitamin D helps your body to absorb calcium. Good sources of calcium and vitamin D include: ? Certain fatty fish, such as salmon and tuna. ? Products that have calcium and vitamin D added to them (fortified products), such as fortified cereals. ? Egg yolks. ? Cheese. ? Liver.  Take over-the-counter and prescription medicines only as told by your health care provider.  Keep all follow-up visits as told by your health care provider. This is important. Contact a health care provider if:  You have never been screened for osteoporosis and you are: ? A woman who is age 51 or older. ? A man who is age 66 or older. Get help right away if:  You fall or injure yourself. Summary  Osteoporosis is thinning and loss of density in your bones. This makes bones more brittle and fragile and more likely to break (fracture),even with minor falls.  The goal of treatment is to strengthen your bones and reduce your risk for a fracture.  Include calcium and vitamin D in  your diet. Calcium is important for bone health, and vitamin D helps your body to absorb calcium.  Talk with your health care provider about screening for osteoporosis if you are a woman who is age 55 or older, or a man who is age 33 or older. This information is not intended to replace advice given to you by your health care provider. Make sure you discuss any questions you have with your health care provider. Document Revised: 03/13/2017 Document Reviewed: 01/23/2017 Elsevier Patient Education  2020 [employer].

## 2019-10-27 ENCOUNTER — Other Ambulatory Visit: Payer: Self-pay | Admitting: Adult Health

## 2019-10-28 LAB — CBC WITH DIFFERENTIAL/PLATELET
Absolute Monocytes: 310 cells/uL (ref 200–950)
Basophils Absolute: 29 cells/uL (ref 0–200)
Basophils Relative: 1 %
Eosinophils Absolute: 49 cells/uL (ref 15–500)
Eosinophils Relative: 1.7 %
HCT: 38 % (ref 35.0–45.0)
Hemoglobin: 12.7 g/dL (ref 11.7–15.5)
Lymphs Abs: 844 cells/uL — ABNORMAL LOW (ref 850–3900)
MCH: 32.2 pg (ref 27.0–33.0)
MCHC: 33.4 g/dL (ref 32.0–36.0)
MCV: 96.2 fL (ref 80.0–100.0)
MPV: 9.7 fL (ref 7.5–12.5)
Monocytes Relative: 10.7 %
Neutro Abs: 1668 cells/uL (ref 1500–7800)
Neutrophils Relative %: 57.5 %
Platelets: 174 10*3/uL (ref 140–400)
RBC: 3.95 10*6/uL (ref 3.80–5.10)
RDW: 11.4 % (ref 11.0–15.0)
Total Lymphocyte: 29.1 %
WBC: 2.9 10*3/uL — ABNORMAL LOW (ref 3.8–10.8)

## 2019-10-28 LAB — COMPLETE METABOLIC PANEL WITH GFR
AG Ratio: 2.4 (calc) (ref 1.0–2.5)
ALT: 12 U/L (ref 6–29)
AST: 20 U/L (ref 10–35)
Albumin: 4.5 g/dL (ref 3.6–5.1)
Alkaline phosphatase (APISO): 105 U/L (ref 37–153)
BUN: 10 mg/dL (ref 7–25)
CO2: 28 mmol/L (ref 20–32)
Calcium: 9.8 mg/dL (ref 8.6–10.4)
Chloride: 105 mmol/L (ref 98–110)
Creat: 0.79 mg/dL (ref 0.60–0.93)
GFR, Est African American: 85 mL/min/{1.73_m2} (ref >=60)
GFR, Est Non African American: 74 mL/min/{1.73_m2} (ref >=60)
Globulin: 1.9 g/dL (calc) (ref 1.9–3.7)
Glucose, Bld: 80 mg/dL (ref 65–99)
Potassium: 4.2 mmol/L (ref 3.5–5.3)
Sodium: 142 mmol/L (ref 135–146)
Total Bilirubin: 0.8 mg/dL (ref 0.2–1.2)
Total Protein: 6.4 g/dL (ref 6.1–8.1)

## 2019-10-28 LAB — LIPID PANEL
Cholesterol: 193 mg/dL (ref <200)
HDL: 85 mg/dL (ref >=50)
LDL Cholesterol (Calc): 87 mg/dL (calc)
Non-HDL Cholesterol (Calc): 108 mg/dL (calc) (ref <130)
Total CHOL/HDL Ratio: 2.3 (calc) (ref <5.0)
Triglycerides: 113 mg/dL (ref <150)

## 2019-10-28 LAB — URINALYSIS W MICROSCOPIC + REFLEX CULTURE
Bacteria, UA: NONE SEEN /HPF
Bilirubin Urine: NEGATIVE
Glucose, UA: NEGATIVE
Hgb urine dipstick: NEGATIVE
Hyaline Cast: NONE SEEN /LPF
Ketones, ur: NEGATIVE
Leukocyte Esterase: NEGATIVE
Nitrites, Initial: NEGATIVE
Protein, ur: NEGATIVE
RBC / HPF: NONE SEEN /HPF (ref 0–2)
Specific Gravity, Urine: 1.008 (ref 1.001–1.03)
Squamous Epithelial / HPF: NONE SEEN /HPF (ref <=5)
WBC, UA: NONE SEEN /HPF (ref 0–5)
pH: 7.5 (ref 5.0–8.0)

## 2019-10-28 LAB — TEST AUTHORIZATION

## 2019-10-28 LAB — MAGNESIUM: Magnesium: 2.3 mg/dL (ref 1.5–2.5)

## 2019-10-28 LAB — NO CULTURE INDICATED

## 2019-10-28 LAB — TSH: TSH: 0.56 mIU/L (ref 0.40–4.50)

## 2019-10-28 LAB — PATHOLOGIST SMEAR REVIEW

## 2020-01-09 NOTE — Progress Notes (Signed)
Assessment and Plan:  Albena was seen today for acute visit.  Diagnoses and all orders for this visit:  Sore throat -     POC COVID-19 BinaxNow - Neg       -      POC rapid Strep   - Neg Gargle with warm salt water, spit out Discussed OTC lozengers or throat sprays  Diarrhea, unspecified type STOP taking magnesium supplement BID  Monitor symptoms, consider stopping all together.  Seasonal allergic rhinitis due to pollen Discussed using OTC Claritin or zyrtec at night Drying up drainage will like decrease throat soreness.  Calf pain, bilateral No erythema, ecchymosis or tenderness noted Bilateral like related to MSK Discussed stretching to improve this.   Further disposition pending results of labs. Discussed med's effects and SE's.   Over 30 minutes of face to face interview, exam, counseling, chart review, and critical decision making was performed.   Future Appointments  Date Time Provider [place]  05/24/2020  3:00 PM [name], MD [facility] None  10/29/2020 11:15 AM Vicie Mutters, PA-C [facility] None    ------------------------------------------------------------------------------------------------------------------   HPI 74 y.o.female presents for evaluation of sore throat that started about three days ago.  It feels like there is something there.  Hot beverages make it worse, it is constant.  She reports that when she eats it dos not bother her.  She did have some pain in her left ear that was intermittent.  Denis coughing.  She is also having bilateral calf pain that is dull and achy.  She reports it feels tight.  She reports some plantar fascitis on left foot.  She is taking a magnesium supplement 200mg  BID.  She does report diarrhea.  Reports this started after initiation of magnesium supplementation.  [place] stool chart reports type 1 and then it moves to 3-4 Type.  Today she has gone 3-4 times.  She reports she is eating a special diet and is  very health conscience.  She also recently started ginger garlic lemon drink to boost her immune system.  She takes multiple supplements and no prescription medications and prefer more natural remedies.   Past Medical History:  Diagnosis Date  . Proximal humerus fracture 01/20/2019  . Tachycardia    Related to Epinephrine used for dental procedure     Allergies  Allergen Reactions  . Codeine     Emotional-Pt reports that she feels like crying  . Gluten Meal   . Milk-Related Compounds   . Other     MSG and oats  . Sulfa Antibiotics     GI problems  . Iodine Rash    Patient states she had a rash with IV contrast    Current Outpatient Medications on File Prior to Visit  Medication Sig  . Ascorbic Acid (VITAMIN C PO) Take 1 tablet by mouth daily.   . B Complex Vitamins (VITAMIN B COMPLEX PO) Take by mouth.  . calcium gluconate 500 MG tablet Take 1 tablet by mouth 2 (two) times daily.  . Cholecalciferol (VITAMIN D PO) Take 5,000 Units by mouth daily.   . Multiple Vitamins-Minerals (MULTIVITAMIN PO) Take 1 tablet by mouth daily.   . Omega-3 Fatty Acids (OMEGA 3 PO) Take 500 mg by mouth 2 (two) times daily.  Marland Kitchen OVER THE COUNTER MEDICATION Iodine 7 drops daily  . OVER THE COUNTER MEDICATION thytrophen  3 per day.  Marland Kitchen OVER THE COUNTER MEDICATION OsteoSheath 1224 mg daily.  Marland Kitchen OVER THE COUNTER MEDICATION CBD oil  .  OVER THE COUNTER MEDICATION ASHWAGHANDA DROPS  . Probiotic Product (PROBIOTIC DAILY PO) Take 1 tablet by mouth daily.   . Pyridoxine HCl (VITAMIN B-6) 500 MG tablet Take 500 mg by mouth daily.   No current facility-administered medications on file prior to visit.    ROS: Review of Systems  Constitutional: Negative for chills, diaphoresis, fever, malaise/fatigue and weight loss.  HENT: Positive for ear pain and sore throat. Negative for congestion, ear discharge, hearing loss, nosebleeds, sinus pain and tinnitus.   Eyes: Negative for blurred vision, double vision,  photophobia, pain, discharge and redness.  Respiratory: Negative for cough, hemoptysis, sputum production, shortness of breath, wheezing and stridor.   Cardiovascular: Negative for chest pain, palpitations and orthopnea.  Gastrointestinal: Positive for diarrhea. Negative for abdominal pain, blood in stool, constipation, heartburn, melena, nausea and vomiting.  Genitourinary: Negative for dysuria, flank pain, frequency, hematuria and urgency.  Musculoskeletal: Negative for back pain, myalgias and neck pain.  Skin: Negative for itching and rash.  Neurological: Negative for dizziness, tingling and headaches.  Endo/Heme/Allergies: Negative for environmental allergies. Does not bruise/bleed easily.      Physical Exam:  BP 102/64   Pulse 63   Temp 97.7 F (36.5 C)   Ht 5\' 2"  (1.575 m)   Wt 91 lb (41.3 kg)   SpO2 97%   BMI 16.64 kg/m   General Appearance: Well nourished, thin, in no apparent distress. Eyes: PERRLA, EOMs, conjunctiva no swelling or erythema Sinuses: No Frontal/maxillary tenderness ENT/Mouth: Ext aud canals clear, TMs without erythema, bulging. No erythema, swelling.  Mild exudate on post pharynx.  Tonsils not swollen or erythematous. Hearing normal.  Neck: Supple, thyroid normal.  Respiratory: Respiratory effort normal, BS equal bilaterally without rales, rhonchi, wheezing or stridor.  Cardio: RRR with no MRGs. Brisk peripheral pulses without edema.  Abdomen: Soft, + BS.  Non tender, no guarding, rebound, hernias, masses. Lymphatics: Non tender without lymphadenopathy.  Musculoskeletal: Full ROM, 5/5 strength, normal gait. Negative Homan's sign. Skin: Warm, dry without rashes, lesions, ecchymosis.  Neuro: Cranial nerves intact. Normal muscle tone, no cerebellar symptoms. Sensation intact.  Psych: Awake and oriented X 3, normal affect, Insight and Judgment appropriate.     Garnet Sierras, NP 2:12 PM [facility]

## 2020-01-10 ENCOUNTER — Encounter: Payer: Self-pay | Admitting: Adult Health Nurse Practitioner

## 2020-01-10 ENCOUNTER — Ambulatory Visit (INDEPENDENT_AMBULATORY_CARE_PROVIDER_SITE_OTHER): Payer: PPO | Admitting: Adult Health Nurse Practitioner

## 2020-01-10 ENCOUNTER — Other Ambulatory Visit: Payer: Self-pay

## 2020-01-10 VITALS — BP 102/64 | HR 63 | Temp 97.7°F | Ht 62.0 in | Wt 91.0 lb

## 2020-01-10 DIAGNOSIS — R197 Diarrhea, unspecified: Secondary | ICD-10-CM

## 2020-01-10 DIAGNOSIS — J029 Acute pharyngitis, unspecified: Secondary | ICD-10-CM

## 2020-01-10 DIAGNOSIS — J301 Allergic rhinitis due to pollen: Secondary | ICD-10-CM | POA: Diagnosis not present

## 2020-01-10 DIAGNOSIS — M79662 Pain in left lower leg: Secondary | ICD-10-CM | POA: Diagnosis not present

## 2020-01-10 DIAGNOSIS — M79661 Pain in right lower leg: Secondary | ICD-10-CM

## 2020-01-10 NOTE — Patient Instructions (Signed)
° °  Try gargling with warm salt water and spit out.  You can also try OTC throat lozenges to help with the throat discomfort.  You can also try an antihistamine such as Claritin, loratadine.  Another antihistamine that is stronger would be Zyrtec, Cetirizine.  Take this at night, it can cause sleepiness.   Please contact th office with any new or worsening symptoms.   Decrease th amount of magnesium you are taking.   Stop taking the morning dose to see if your diarrhea symptoms improve.

## 2020-01-13 LAB — POC COVID19 BINAXNOW: SARS Coronavirus 2 Ag: NEGATIVE

## 2020-01-13 LAB — POCT RAPID STREP A (OFFICE): Rapid Strep A Screen: NEGATIVE

## 2020-02-10 ENCOUNTER — Other Ambulatory Visit: Payer: Self-pay | Admitting: Internal Medicine

## 2020-02-10 MED ORDER — AMOXICILLIN 250 MG PO CAPS: ORAL_CAPSULE | ORAL | 0 refills | Status: DC

## 2020-02-14 DIAGNOSIS — Z20822 Contact with and (suspected) exposure to covid-19: Secondary | ICD-10-CM | POA: Diagnosis not present

## 2020-03-26 ENCOUNTER — Encounter: Payer: PPO | Admitting: Internal Medicine

## 2020-03-28 ENCOUNTER — Ambulatory Visit (INDEPENDENT_AMBULATORY_CARE_PROVIDER_SITE_OTHER): Payer: PPO | Admitting: Adult Health Nurse Practitioner

## 2020-03-28 ENCOUNTER — Encounter: Payer: Self-pay | Admitting: Adult Health Nurse Practitioner

## 2020-03-28 ENCOUNTER — Other Ambulatory Visit: Payer: Self-pay

## 2020-03-28 VITALS — BP 118/72 | HR 65 | Temp 97.6°F | Ht 62.0 in | Wt 92.2 lb

## 2020-03-28 DIAGNOSIS — H6502 Acute serous otitis media, left ear: Secondary | ICD-10-CM | POA: Diagnosis not present

## 2020-03-28 DIAGNOSIS — R634 Abnormal weight loss: Secondary | ICD-10-CM

## 2020-03-28 DIAGNOSIS — R0989 Other specified symptoms and signs involving the circulatory and respiratory systems: Secondary | ICD-10-CM

## 2020-03-28 DIAGNOSIS — R42 Dizziness and giddiness: Secondary | ICD-10-CM | POA: Diagnosis not present

## 2020-03-28 NOTE — Patient Instructions (Addendum)
° °  Work on putting together some stretching and weight strengthening exercises.  Use light weight or resistance bands for this.  Do upper and lower body.  You can look for supplemental drink, Boost Breeze.  This is a juice based supplement rather than milk.       Take Cetirazine (Zyrtec) nightly to help dry up fluid in your ears.  This can make you sleepy so take before bed.  Can also cause dry mouth so increase your water intake.   We will respond via MyChart with your lab results.

## 2020-03-28 NOTE — Progress Notes (Signed)
Assessment and Plan:  Tricia [name] was seen today for weight loss and dizziness.  Diagnoses and all orders for this visit:  Labile hypertension Controlled today -     CBC with Differential/Platelet -     COMPLETE METABOLIC PANEL WITH GFR -     TSH  Dizziness -     CBC with Differential/Platelet -     COMPLETE METABOLIC PANEL WITH GFR -     TSH Related to nutritional intake vs serous otitis?   Weight loss Discussed supplements such as Boost Breeze. Discussed increasing portions of the foods she eats She is vegetarian Discussed high calorie foods Do weight bearing exercises to increase wieght  Acute non-recurrent serous otitis Zyrtec nightly Discussed medication and side effects.    Further disposition pending results of labs. Discussed med's effects and SE's.   Over 30 minutes of face to face interview, exam, counseling, chart review, and critical decision making was performed.    Future Appointments  Date Time Provider [place]  05/24/2020  3:00 PM [name], MD [facility] None  10/29/2020 11:00 AM Garnet Sierras, NP [facility] None    ------------------------------------------------------------------------------------------------------------------   HPI 74 y.o.female presents for evaluation of wight loss.  She reports that she lost about 5lbs after she hurt her shoulder.  Th she continued to lose reports a total loss of 15lbs since 01/2019.  She then fracture her left knee 08/2019.   She has protein powder 3-4 times a day in coffee, decaf or water. Breakfast: One egg, crepe, toast (gluten free) Jelly or syrup, blueberries Snack: at times, cholate, almonds, walnuts Lunch: typically lite, avocados and cooks with olive oil Dinner: Salmon, baked potatoes, sweet potatoes She is vegetarian and eats lots of vegetables, olives  She is not exercising at this time. Reports she used to at one point.   Past Medical History:  Diagnosis Date  . Proximal humerus  fracture 01/20/2019  . Tachycardia    Related to Epinephrine used for dental procedure     Allergies  Allergen Reactions  . Codeine     Emotional-Pt reports that she feels like crying  . Gluten Meal   . Milk-Related Compounds   . Other     MSG and oats  . Sulfa Antibiotics     GI problems  . Iodine Rash    Patient states she had a rash with IV contrast    Current Outpatient Medications on File Prior to Visit  Medication Sig  . Ascorbic Acid (VITAMIN C PO) Take 1 tablet by mouth daily.   . B Complex Vitamins (VITAMIN B COMPLEX PO) Take by mouth.  . Cholecalciferol (VITAMIN D PO) Take 5,000 Units by mouth daily.   . Multiple Vitamins-Minerals (MULTIVITAMIN PO) Take 1 tablet by mouth daily.   . Omega-3 Fatty Acids (OMEGA 3 PO) Take 500 mg by mouth 2 (two) times daily.  . Probiotic Product (PROBIOTIC DAILY PO) Take 1 tablet by mouth daily.   Marland Kitchen zinc gluconate 50 MG tablet Take 50 mg by mouth daily.   No current facility-administered medications on file prior to visit.    ROS: all negative except above.   Physical Exam:  BP 118/72   Pulse 65   Temp 97.6 F (36.4 C)   Ht 5\' 2"  (1.575 m)   Wt 92 lb 3.2 oz (41.8 kg)   SpO2 98%   BMI 16.86 kg/m   General Appearance:Thin, frail, in no apparent distress. Eyes: PERRLA, EOMs, conjunctiva no swelling or erythema Sinuses: No Frontal/maxillary  tenderness ENT/Mouth: Ext aud canals clear, TMs without erythema, bulging. No erythema, swelling, or exudate on post pharynx.  Tonsils not swollen or erythematous. Hearing normal.  Neck: Supple, thyroid normal.  Respiratory: Respiratory effort normal, BS equal bilaterally without rales, rhonchi, wheezing or stridor.  Cardio: RRR with no MRGs. Brisk peripheral pulses without edema.  Abdomen: Soft, + BS.  Non tender, no guarding, rebound, hernias, masses. Lymphatics: Non tender without lymphadenopathy.  Musculoskeletal: Full ROM, 5/5 strength, normal gait.  Skin: Warm, dry without rashes,  lesions, ecchymosis.  Neuro: Cranial nerves intact. Normal muscle tone, no cerebellar symptoms. Sensation intact.  Psych: Awake and oriented X 3, normal affect, Insight and Judgment appropriate.      Garnet Sierras, Laqueta Jean, DNP [facility] 03/28/2020  4:59 PM

## 2020-03-29 LAB — CBC WITH DIFFERENTIAL/PLATELET
Absolute Monocytes: 364 cells/uL (ref 200–950)
Basophils Absolute: 40 cells/uL (ref 0–200)
Basophils Relative: 1.1 %
Eosinophils Absolute: 61 cells/uL (ref 15–500)
Eosinophils Relative: 1.7 %
HCT: 36.8 % (ref 35.0–45.0)
Hemoglobin: 12.8 g/dL (ref 11.7–15.5)
Lymphs Abs: 929 cells/uL (ref 850–3900)
MCH: 33.5 pg — ABNORMAL HIGH (ref 27.0–33.0)
MCHC: 34.8 g/dL (ref 32.0–36.0)
MCV: 96.3 fL (ref 80.0–100.0)
MPV: 9.8 fL (ref 7.5–12.5)
Monocytes Relative: 10.1 %
Neutro Abs: 2207 cells/uL (ref 1500–7800)
Neutrophils Relative %: 61.3 %
Platelets: 173 10*3/uL (ref 140–400)
RBC: 3.82 10*6/uL (ref 3.80–5.10)
RDW: 11.6 % (ref 11.0–15.0)
Total Lymphocyte: 25.8 %
WBC: 3.6 10*3/uL — ABNORMAL LOW (ref 3.8–10.8)

## 2020-03-29 LAB — COMPLETE METABOLIC PANEL WITH GFR
AG Ratio: 2.3 (calc) (ref 1.0–2.5)
ALT: 19 U/L (ref 6–29)
AST: 23 U/L (ref 10–35)
Albumin: 4.4 g/dL (ref 3.6–5.1)
Alkaline phosphatase (APISO): 103 U/L (ref 37–153)
BUN: 12 mg/dL (ref 7–25)
CO2: 30 mmol/L (ref 20–32)
Calcium: 9.5 mg/dL (ref 8.6–10.4)
Chloride: 105 mmol/L (ref 98–110)
Creat: 0.69 mg/dL (ref 0.60–0.93)
GFR, Est African American: 99 mL/min/{1.73_m2} (ref >=60)
GFR, Est Non African American: 86 mL/min/{1.73_m2} (ref >=60)
Globulin: 1.9 g/dL (calc) (ref 1.9–3.7)
Glucose, Bld: 102 mg/dL — ABNORMAL HIGH (ref 65–99)
Potassium: 4.4 mmol/L (ref 3.5–5.3)
Sodium: 140 mmol/L (ref 135–146)
Total Bilirubin: 0.4 mg/dL (ref 0.2–1.2)
Total Protein: 6.3 g/dL (ref 6.1–8.1)

## 2020-03-29 LAB — TSH: TSH: 0.68 mIU/L (ref 0.40–4.50)

## 2020-04-26 ENCOUNTER — Ambulatory Visit: Payer: PPO | Admitting: Adult Health Nurse Practitioner

## 2020-04-30 ENCOUNTER — Ambulatory Visit: Payer: PPO | Admitting: Adult Health Nurse Practitioner

## 2020-05-01 NOTE — Progress Notes (Deleted)
Assessment and Plan:  Tricia [name] was seen today for weight loss and dizziness.  Diagnoses and all orders for this visit:  Labile hypertension Controlled today -     CBC with Differential/Platelet -     COMPLETE METABOLIC PANEL WITH GFR -     TSH  Dizziness -     CBC with Differential/Platelet -     COMPLETE METABOLIC PANEL WITH GFR -     TSH Related to nutritional intake vs serous otitis?   Weight loss Discussed supplements such as Boost Breeze. Discussed increasing portions of the foods she eats She is vegetarian Discussed high calorie foods Do weight bearing exercises to increase wieght  Acute non-recurrent serous otitis Zyrtec nightly Discussed medication and side effects.    Further disposition pending results of labs. Discussed med's effects and SE's.   Over 30 minutes of face to face interview, exam, counseling, chart review, and critical decision making was performed.    Future Appointments  Date Time Provider [place]  05/02/2020  1:45 PM Garnet Sierras, NP [facility] None  05/24/2020  3:00 PM [name], MD [facility] None  10/29/2020 11:00 AM Garnet Sierras, NP [facility] None    ------------------------------------------------------------------------------------------------------------------   HPI 75 y.o.female presents for evaluation of wight loss.  She reports that she lost about 5lbs after she hurt her shoulder.  Th she continued to lose reports a total loss of 15lbs since 01/2019.  She then fracture her left knee 08/2019.   She has protein powder 3-4 times a day in coffee, decaf or water. Breakfast: One egg, crepe, toast (gluten free) Jelly or syrup, blueberries Snack: at times, cholate, almonds, walnuts Lunch: typically lite, avocados and cooks with olive oil Dinner: Salmon, baked potatoes, sweet potatoes She is vegetarian and eats lots of vegetables, olives  She is not exercising at this time. Reports she used to at one point.   Past  Medical History:  Diagnosis Date  . Proximal humerus fracture 01/20/2019  . Tachycardia    Related to Epinephrine used for dental procedure     Allergies  Allergen Reactions  . Codeine     Emotional-Pt reports that she feels like crying  . Gluten Meal   . Milk-Related Compounds   . Other     MSG and oats  . Sulfa Antibiotics     GI problems  . Iodine Rash    Patient states she had a rash with IV contrast    Current Outpatient Medications on File Prior to Visit  Medication Sig  . Ascorbic Acid (VITAMIN C PO) Take 1 tablet by mouth daily.   . B Complex Vitamins (VITAMIN B COMPLEX PO) Take by mouth.  . Cholecalciferol (VITAMIN D PO) Take 5,000 Units by mouth daily.   . Multiple Vitamins-Minerals (MULTIVITAMIN PO) Take 1 tablet by mouth daily.   . Omega-3 Fatty Acids (OMEGA 3 PO) Take 500 mg by mouth 2 (two) times daily.  . Probiotic Product (PROBIOTIC DAILY PO) Take 1 tablet by mouth daily.   Marland Kitchen zinc gluconate 50 MG tablet Take 50 mg by mouth daily.   No current facility-administered medications on file prior to visit.    ROS: all negative except above.   Physical Exam:  There were no vitals taken for this visit.  General Appearance:Thin, frail, in no apparent distress. Eyes: PERRLA, EOMs, conjunctiva no swelling or erythema Sinuses: No Frontal/maxillary tenderness ENT/Mouth: Ext aud canals clear, TMs without erythema, bulging. No erythema, swelling, or exudate on post pharynx.  Tonsils not  swollen or erythematous. Hearing normal.  Neck: Supple, thyroid normal.  Respiratory: Respiratory effort normal, BS equal bilaterally without rales, rhonchi, wheezing or stridor.  Cardio: RRR with no MRGs. Brisk peripheral pulses without edema.  Abdomen: Soft, + BS.  Non tender, no guarding, rebound, hernias, masses. Lymphatics: Non tender without lymphadenopathy.  Musculoskeletal: Full ROM, 5/5 strength, normal gait.  Skin: Warm, dry without rashes, lesions, ecchymosis.  Neuro:  Cranial nerves intact. Normal muscle tone, no cerebellar symptoms. Sensation intact.  Psych: Awake and oriented X 3, normal affect, Insight and Judgment appropriate.      Garnet Sierras, Laqueta Jean, DNP [facility] 05/01/2020  11:07 AM

## 2020-05-02 ENCOUNTER — Ambulatory Visit: Payer: PPO | Admitting: Adult Health Nurse Practitioner

## 2020-05-11 ENCOUNTER — Encounter: Payer: PPO | Admitting: Internal Medicine

## 2020-05-24 ENCOUNTER — Ambulatory Visit (INDEPENDENT_AMBULATORY_CARE_PROVIDER_SITE_OTHER): Payer: PPO | Admitting: Internal Medicine

## 2020-05-24 ENCOUNTER — Other Ambulatory Visit: Payer: Self-pay

## 2020-05-24 VITALS — BP 108/72 | HR 65 | Temp 97.4°F | Resp 16 | Ht 61.5 in | Wt 89.6 lb

## 2020-05-24 DIAGNOSIS — Z79899 Other long term (current) drug therapy: Secondary | ICD-10-CM

## 2020-05-24 DIAGNOSIS — E559 Vitamin D deficiency, unspecified: Secondary | ICD-10-CM

## 2020-05-24 DIAGNOSIS — R0989 Other specified symptoms and signs involving the circulatory and respiratory systems: Secondary | ICD-10-CM

## 2020-05-24 DIAGNOSIS — Z0000 Encounter for general adult medical examination without abnormal findings: Secondary | ICD-10-CM | POA: Diagnosis not present

## 2020-05-24 DIAGNOSIS — Z136 Encounter for screening for cardiovascular disorders: Secondary | ICD-10-CM

## 2020-05-24 DIAGNOSIS — E782 Mixed hyperlipidemia: Secondary | ICD-10-CM | POA: Diagnosis not present

## 2020-05-24 DIAGNOSIS — R7309 Other abnormal glucose: Secondary | ICD-10-CM

## 2020-05-24 DIAGNOSIS — Z1212 Encounter for screening for malignant neoplasm of rectum: Secondary | ICD-10-CM

## 2020-05-24 DIAGNOSIS — Z8249 Family history of ischemic heart disease and other diseases of the circulatory system: Secondary | ICD-10-CM

## 2020-05-24 DIAGNOSIS — Z0001 Encounter for general adult medical examination with abnormal findings: Secondary | ICD-10-CM

## 2020-05-24 DIAGNOSIS — Z1211 Encounter for screening for malignant neoplasm of colon: Secondary | ICD-10-CM

## 2020-05-24 NOTE — Patient Instructions (Signed)

## 2020-05-24 NOTE — Progress Notes (Signed)
Annual Screening/Preventative Visit & Comprehensive Evaluation &  Examination      This very nice 75 y.o. DWF presents for a Screening /Preventative Visit & comprehensive evaluation and management of multiple medical co-morbidities.  Patient has been followed for HTN, HLD, Prediabetes  and Vitamin D Deficiency.       Patient has been followed for labile HTN  . Patient's BP has been controlled at home and patient denies any cardiac symptoms as chest pain, palpitations, shortness of breath, dizziness or ankle swelling. Today's BP is at goal -  108/72.       Patient's lipids are controlled with diet and medications. Patient denies myalgias or other medication SE's. Last lipids were at goal:  Lab Results  Component Value Date   CHOL 193 10/26/2019   HDL 85 10/26/2019   LDLCALC 87 10/26/2019   TRIG 113 10/26/2019   CHOLHDL 2.3 10/26/2019        Patient has been monitored expectantly for glucose intolerance and patient denies reactive hypoglycemic symptoms, visual blurring, diabetic polys or paresthesias. Last A1c was at goal:  Lab Results  Component Value Date   HGBA1C 5.3 05/02/2019   Wt Readings from Last 3 Encounters:  03/28/20 92 lb 3.2 oz (41.8 kg)  01/10/20 91 lb (41.3 kg)  10/26/19 95 lb 6.4 oz (43.3 kg)       Finally, patient has history of Vitamin D Deficiency and last Vitamin D was slightly elevated:  Lab Results  Component Value Date   VD25OH 118 (H) 05/02/2019    Current Outpatient Medications on File Prior to Visit  Medication Sig  . VITAMIN C  Take 1 tablet  daily.   . B Complex Vitamins  Take daily  . VITAMIN D  5,000 Units Take daily.   . Multiple Vitamins-Minerals  Take 1 tablet daily.   . OMEGA 3 FA - 500 mg Take  2  times daily.  . K2  Takes 1 tablet daily.  . Probiotic  Take 1 tablet  daily.   Marland Kitchen zinc  50 MG tablet Take  daily.     Allergies  Allergen Reactions  . Codeine Emotional-Pt reports that she feels like crying  . Gluten Meal   .  Milk-Related Compounds   . MSG and oats   . Sulfa Antibiotics GI problems  . Iodine Rash    Past Medical History:  Diagnosis Date  . Proximal humerus fracture 01/20/2019  . Tachycardia    Related to Epinephrine used for dental procedure   Health Maintenance  Topic Date Due  . COVID-19 Vaccine (1) Never done  . Fecal DNA (Cologuard)  03/10/2021  . DEXA SCAN  Completed  . INFLUENZA VACCINE  Discontinued  . MAMMOGRAM  Discontinued  . TETANUS/TDAP  Discontinued  . Hepatitis C Screening  Discontinued  . PNA vac Low Risk Adult  Discontinued   Declines All immunizations  Last Colon -  age 84 yo and declines repeat.  Last MGM -   Past Surgical History:  Procedure Laterality Date  . colonscopy    . ORIF HUMERUS FRACTURE Right 01/20/2019   Procedure: OPEN REDUCTION INTERNAL FIXATION (ORIF) PROXIMAL HUMERUS FRACTURE;  Surgeon: Justice Britain, MD;  Location: [facility];  Service: Orthopedics;  Laterality: Right;  140min  . OVARIAN CYST REMOVAL  1973   Family History  Problem Relation Age of Onset  . Leukemia Mother   . Goiter Mother   . Stroke Father   . Cancer Father   . Heart  attack Father    Social History   Tobacco Use  . Smoking status: Never Smoker  . Smokeless tobacco: Never Used  Vaping Use  . Vaping Use: Never used  Substance Use Topics  . Alcohol use: No  . Drug use: No    ROS Constitutional: Denies fever, chills, weight loss/gain, headaches, insomnia,  night sweats, and change in appetite. Does c/o fatigue. Eyes: Denies redness, blurred vision, diplopia, discharge, itchy, watery eyes.  ENT: Denies discharge, congestion, post nasal drip, epistaxis, sore throat, earache, hearing loss, dental pain, Tinnitus, Vertigo, Sinus pain, snoring.  Cardio: Denies chest pain, palpitations, irregular heartbeat, syncope, dyspnea, diaphoresis, orthopnea, PND, claudication, edema Respiratory: denies cough, dyspnea, DOE, pleurisy, hoarseness, laryngitis, wheezing.   Gastrointestinal: Denies dysphagia, heartburn, reflux, water brash, pain, cramps, nausea, vomiting, bloating, diarrhea, constipation, hematemesis, melena, hematochezia, jaundice, hemorrhoids Genitourinary: Denies dysuria, frequency, urgency, nocturia, hesitancy, discharge, hematuria, flank pain Breast: Breast lumps, nipple discharge, bleeding.  Musculoskeletal: Denies arthralgia, myalgia, stiffness, Jt. Swelling, pain, limp, and strain/sprain. Denies falls. Skin: Denies puritis, rash, hives, warts, acne, eczema, changing in skin lesion Neuro: No weakness, tremor, incoordination, spasms, paresthesia, pain Psychiatric: Denies confusion, memory loss, sensory loss. Denies Depression. Endocrine: Denies change in weight, skin, hair change, nocturia, and paresthesia, diabetic polys, visual blurring, hyper / hypo glycemic episodes.  Heme/Lymph: No excessive bleeding, bruising, enlarged lymph nodes.  Physical Exam  BP 108/72   Pulse 65   Temp (!) 97.4 F (36.3 C)   Resp 16   Ht 5' 1.5" (1.562 m)   Wt 89 lb 9.6 oz (40.6 kg)   SpO2 97%   BMI 16.66 kg/m   General Appearance: Well nourished, well groomed and in no apparent distress.  Eyes: PERRLA, EOMs, conjunctiva no swelling or erythema, normal fundi and vessels. Sinuses: No frontal/maxillary tenderness ENT/Mouth: EACs patent / TMs  nl. Nares clear without erythema, swelling, mucoid exudates. Oral hygiene is good. No erythema, swelling, or exudate. Tongue normal, non-obstructing. Tonsils not swollen or erythematous. Hearing normal.  Neck: Supple, thyroid not palpable. No bruits, nodes or JVD. Respiratory: Respiratory effort normal.  BS equal and clear bilateral without rales, rhonci, wheezing or stridor. Cardio: Heart sounds are normal with regular rate and rhythm and no murmurs, rubs or gallops. Peripheral pulses are normal and equal bilaterally without edema. No aortic or femoral bruits. Chest: symmetric with normal excursions and  percussion. Breasts: Symmetric, without lumps, nipple discharge, retractions, or fibrocystic changes.  Abdomen: Flat, soft with bowel sounds active. Nontender, no guarding, rebound, hernias, masses, or organomegaly.  Lymphatics: Non tender without lymphadenopathy.  Genitourinary:  Musculoskeletal: Full ROM all peripheral extremities, joint stability, 5/5 strength, and normal gait. Skin: Warm and dry without rashes, lesions, cyanosis, clubbing or  ecchymosis.  Neuro: Cranial nerves intact, reflexes equal bilaterally. Normal muscle tone, no cerebellar symptoms. Sensation intact.  Pysch: Alert and oriented X 3, normal affect, Insight and Judgment appropriate.   Assessment and Plan  1. Annual Preventative Screening Examination   2. Labile hypertension  - EKG 12-Lead - Urinalysis, Routine w reflex microscopic - Microalbumin / creatinine urine ratio - CBC with Differential/Platelet - COMPLETE METABOLIC PANEL WITH GFR - Magnesium - TSH  3. Hyperlipidemia, mixed  - EKG 12-Lead - Lipid panel - TSH  4. Abnormal glucose  - EKG 12-Lead - Hemoglobin A1c - Insulin, random  5. Vitamin D deficiency  - VITAMIN D 25 Hydroxyl  6. Screening for colorectal cancer  - POC Hemoccult Bld/Stl   7. Screening for ischemic heart disease  -  EKG 12-Lead  8. FHx: heart disease  - EKG 12-Lead  9. Medication management  - Urinalysis, Routine w reflex microscopic - Microalbumin / creatinine urine ratio - CBC with Differential/Platelet - COMPLETE METABOLIC PANEL WITH GFR - Magnesium - Lipid panel - TSH - Hemoglobin A1c - Insulin, random - VITAMIN D 25 Hydroxyl         Patient was counseled in prudent diet to achieve/maintain BMI less than 25 for weight control, BP monitoring, regular exercise and medications. Discussed med's effects and SE's. Screening labs and tests as requested with regular follow-up as recommended. Over 40 minutes of exam, counseling, chart review and high complex  critical decision making was performed.   Kirtland Bouchard, MD

## 2020-05-25 LAB — COMPLETE METABOLIC PANEL WITH GFR
AG Ratio: 2.4 (calc) (ref 1.0–2.5)
ALT: 14 U/L (ref 6–29)
AST: 24 U/L (ref 10–35)
Albumin: 4.5 g/dL (ref 3.6–5.1)
Alkaline phosphatase (APISO): 105 U/L (ref 37–153)
BUN: 10 mg/dL (ref 7–25)
CO2: 29 mmol/L (ref 20–32)
Calcium: 9.8 mg/dL (ref 8.6–10.4)
Chloride: 105 mmol/L (ref 98–110)
Creat: 0.71 mg/dL (ref 0.60–0.93)
GFR, Est African American: 97 mL/min/{1.73_m2} (ref >=60)
GFR, Est Non African American: 84 mL/min/{1.73_m2} (ref >=60)
Globulin: 1.9 g/dL (calc) (ref 1.9–3.7)
Glucose, Bld: 84 mg/dL (ref 65–99)
Potassium: 4.1 mmol/L (ref 3.5–5.3)
Sodium: 141 mmol/L (ref 135–146)
Total Bilirubin: 0.6 mg/dL (ref 0.2–1.2)
Total Protein: 6.4 g/dL (ref 6.1–8.1)

## 2020-05-25 LAB — URINALYSIS, ROUTINE W REFLEX MICROSCOPIC
Bilirubin Urine: NEGATIVE
Glucose, UA: NEGATIVE
Hgb urine dipstick: NEGATIVE
Ketones, ur: NEGATIVE
Leukocytes,Ua: NEGATIVE
Nitrite: NEGATIVE
Protein, ur: NEGATIVE
Specific Gravity, Urine: 1.013 (ref 1.001–1.03)
pH: 7 (ref 5.0–8.0)

## 2020-05-25 LAB — CBC WITH DIFFERENTIAL/PLATELET
Absolute Monocytes: 391 cells/uL (ref 200–950)
Basophils Absolute: 31 cells/uL (ref 0–200)
Basophils Relative: 0.9 %
Eosinophils Absolute: 99 cells/uL (ref 15–500)
Eosinophils Relative: 2.9 %
HCT: 36.7 % (ref 35.0–45.0)
Hemoglobin: 13 g/dL (ref 11.7–15.5)
Lymphs Abs: 660 cells/uL — ABNORMAL LOW (ref 850–3900)
MCH: 33.1 pg — ABNORMAL HIGH (ref 27.0–33.0)
MCHC: 35.4 g/dL (ref 32.0–36.0)
MCV: 93.4 fL (ref 80.0–100.0)
MPV: 9.3 fL (ref 7.5–12.5)
Monocytes Relative: 11.5 %
Neutro Abs: 2220 cells/uL (ref 1500–7800)
Neutrophils Relative %: 65.3 %
Platelets: 168 10*3/uL (ref 140–400)
RBC: 3.93 10*6/uL (ref 3.80–5.10)
RDW: 11.4 % (ref 11.0–15.0)
Total Lymphocyte: 19.4 %
WBC: 3.4 10*3/uL — ABNORMAL LOW (ref 3.8–10.8)

## 2020-05-25 LAB — LIPID PANEL
Cholesterol: 202 mg/dL — ABNORMAL HIGH (ref <200)
HDL: 82 mg/dL (ref >=50)
LDL Cholesterol (Calc): 97 mg/dL (calc)
Non-HDL Cholesterol (Calc): 120 mg/dL (calc) (ref <130)
Total CHOL/HDL Ratio: 2.5 (calc) (ref <5.0)
Triglycerides: 126 mg/dL (ref <150)

## 2020-05-25 LAB — INSULIN, RANDOM: Insulin: 9.8 u[IU]/mL

## 2020-05-25 LAB — MAGNESIUM: Magnesium: 2.3 mg/dL (ref 1.5–2.5)

## 2020-05-25 LAB — MICROALBUMIN / CREATININE URINE RATIO
Creatinine, Urine: 62 mg/dL (ref 20–275)
Microalb Creat Ratio: 5 mcg/mg creat (ref <30)
Microalb, Ur: 0.3 mg/dL

## 2020-05-25 LAB — HEMOGLOBIN A1C
Hgb A1c MFr Bld: 5.2 % of total Hgb (ref <5.7)
Mean Plasma Glucose: 103 mg/dL
eAG (mmol/L): 5.7 mmol/L

## 2020-05-25 LAB — TSH: TSH: 0.9 mIU/L (ref 0.40–4.50)

## 2020-05-25 LAB — VITAMIN D 25 HYDROXY (VIT D DEFICIENCY, FRACTURES): Vit D, 25-Hydroxy: 83 ng/mL (ref 30–100)

## 2020-05-26 NOTE — Progress Notes (Signed)
========================================================== -   Test results slightly outside the reference range are not unusual. If there is anything important, I will review this with you,  otherwise it is considered normal test values.  If you have further questions,  please do not hesitate to contact me at the office or via My Chart.  ========================================================== ==========================================================  -  Chol = 202 and LDL  = 97 with a                                                    very high "good" HDL level of 82 is  Excellent   - Very low risk for Heart Attack  / Stroke  ========================================================== ==========================================================  - A1c - is Normal - great - No Diabetes  ========================================================== ==========================================================  -  Vitamin D = 83 - Excellent  !  ========================================================== ==========================================================  -  All Else - CBC - Kidneys - Electrolytes - Liver - Magnesium & Thyroid    - all  Normal / OK  ===========================================================  ===========================================================  - Keep up the [place] Work   !  ===========================================================  ===========================================================

## 2020-05-27 ENCOUNTER — Encounter: Payer: Self-pay | Admitting: Internal Medicine

## 2020-08-29 ENCOUNTER — Ambulatory Visit: Payer: PPO | Admitting: Adult Health

## 2020-08-31 DIAGNOSIS — E441 Mild protein-calorie malnutrition: Secondary | ICD-10-CM | POA: Insufficient documentation

## 2020-08-31 DIAGNOSIS — Z681 Body mass index (BMI) 19 or less, adult: Secondary | ICD-10-CM | POA: Insufficient documentation

## 2020-08-31 NOTE — Progress Notes (Deleted)
MEDICARE ANNUAL WELLNESS VISIT AND FOLLOW UP Assessment:   Diagnoses and all orders for this visit:  Encounter for Medicare annual wellness exam Yearly  Labile hypertension Doing well Monitor blood pressure in office Monitors diet, decreased sodium Will continue to monitor  Hyperlipidemia, mixed Taking Omega-3, 2,000mg  daily Discussed dietary and exercise modifications Mild elevations managed by lifestyle Check lipid panel   Age related osteoporosis, unspecified pathological fracture presence Taking Calcium and Vitamin D Supplementation, discussed vitamin K Participates in strengthening and stretching exercises at home - resume once knee allows Dexa follow up due 02/2020 - declines this year, will plan to get in 2022 ***  Vitamin D deficiency At goal with 5000 IU Continue supplementation, check levels at CPE   BMI <19 ***  Follow Up Instructions:  I discussed the assessment and treatment plan with the patient. The patient was provided an opportunity to ask questions and all were answered. The patient agreed with the plan and demonstrated an understanding of the instructions.   The patient was advised to call back or seek an in-person evaluation if the symptoms worsen or if the condition fails to improve as anticipated.  I provided 30 minutes of non-face-to-face time during this encounter including counseling, chart review, and critical decision making was preformed.   Future Appointments  Date Time Provider [place]  09/03/2020 11:30 AM Liane Comber, NP [facility] None  12/05/2020 10:30 AM [name], MD [facility] None  06/04/2021 11:00 AM [name], MD [facility] None  09/03/2021 11:30 AM Liane Comber, NP [facility] None      Plan:   During the course of the visit the patient was educated and counseled about appropriate screening and preventive services including:    Pneumococcal vaccine   Influenza vaccine  Prevnar 13  Td  vaccine  Screening electrocardiogram, deferred, telephone visit [place].  Colorectal cancer screening  Diabetes screening  Glaucoma screening  Nutrition counseling    Subjective:  Tricia [name] is a 75 y.o. female who presents for Medicare Annual Wellness Visit and 3 month follow up for labile HTN, hyperlipidemia, osteoporosis, and vitamin D Def.   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   Had fall in May 2021, fractured L patella, bracing only, Dr. Stann Mainland followed and resolved.   She had DEXA in 02/2018 which showed R fem T -4.0, was recommended fosamax but ultimately declined, doing calcium *** vitamin D and K2, was doing taichi videos 5 days a week until recent patellar fracture ***  BMI is There is no height or weight on file to calculate BMI., she has been working on diet and exercise. Wt Readings from Last 3 Encounters:  05/24/20 89 lb 9.6 oz (40.6 kg)  03/28/20 92 lb 3.2 oz (41.8 kg)  01/10/20 91 lb (41.3 kg)   Her blood pressure has been controlled at home, today their BP is   She does workout. She denies chest pain, shortness of breath, dizziness.   She is not on cholesterol medication and denies myalgias. Her cholesterol is at goal. The cholesterol last visit was:   Lab Results  Component Value Date   CHOL 202 (H) 05/24/2020   HDL 82 05/24/2020   LDLCALC 97 05/24/2020   TRIG 126 05/24/2020   CHOLHDL 2.5 05/24/2020   Last GFR Lab Results  Component Value Date   GFRNONAA 84 05/24/2020   Patient is on Vitamin D supplement.  Was above goal, was taking 10000 IU and has reduced to taking  5000 IU  Lab Results  Component Value Date   VD25OH 83 05/24/2020      Medication Review:       Current Outpatient Medications (Other):  Marland Kitchen  Ascorbic Acid (VITAMIN C PO), Take 1 tablet by mouth daily.  .  B Complex Vitamins (VITAMIN B COMPLEX PO), Take by mouth. .  Cholecalciferol (VITAMIN D PO), Take 5,000 Units by mouth daily.  .  Multiple  Vitamins-Minerals (MULTIVITAMIN PO), Take 1 tablet by mouth daily.  .  Omega-3 Fatty Acids (OMEGA 3 PO), Take 500 mg by mouth 2 (two) times daily. Marland Kitchen  OVER THE COUNTER MEDICATION, Takes K2 1 tablet daily. .  Probiotic Product (PROBIOTIC DAILY PO), Take 1 tablet by mouth daily.  Marland Kitchen  zinc gluconate 50 MG tablet, Take 50 mg by mouth daily.  Allergies: Allergies  Allergen Reactions  . Codeine     Emotional-Pt reports that she feels like crying  . Gluten Meal   . Milk-Related Compounds   . Other     MSG and oats  . Sulfa Antibiotics     GI problems  . Iodine Rash    Patient states she had a rash with IV contrast    Current Problems (verified) has Vitamin D deficiency; Osteoporosis; FHx: heart disease; Hyperlipidemia, mixed; Labile hypertension; and BMI less than 19,adult on their problem list.  Screening Tests  There is no immunization history on file for this patient.  Preventative care: Last colonoscopy: remote Cologuard: 02/2018  Mammogram 2018 [name] - patient has declined further mammograms  DEXA: 02/2018, T-4.0 R fem, 2017 T-4.1, declined fosamax - declines this year - plan to get 2022 ***  Prior vaccinations: DECLINES ALL VACCINATIONS  Names of Other Physician/Practitioners you currently use: 1. [facility] here for primary care 2. Dr. [place], 2020, monitoring mild cataracts, wears glasses 3. Dr Pablo Lawrence, 2021  Patient Care Team: [name], MD as PCP - General (Internal Medicine) Paula Compton, MD as Consulting Physician (Obstetrics and Gynecology) Richmond Campbell, MD as Consulting Physician (Gastroenterology)  Surgical: She  has a past surgical history that includes Ovarian cyst removal (1973); colonscopy; and ORIF humerus fracture (Right, 01/20/2019). Family Her family history includes Cancer in her father; Goiter in her mother; Heart attack in her father; Leukemia in her mother; Stroke in her father. Social  history  She reports that she has never smoked. She has never used smokeless tobacco. She reports that she does not drink alcohol and does not use drugs.  MEDICARE WELLNESS OBJECTIVES: Physical activity:   Cardiac risk factors:   Depression/mood screen:   Depression screen [facility] 2/9 05/27/2020  Decreased Interest 0  Down, Depressed, Hopeless 0  PHQ - 2 Score 0  Altered sleeping -  Tired, decreased energy -  Change in appetite -  Feeling bad or failure about yourself  -  Trouble concentrating -  Moving slowly or fidgety/restless -  Suicidal thoughts -  PHQ-9 Score -    ADLs:  In your present state of health, do you have any difficulty performing the following activities: 05/27/2020 10/26/2019  Hearing? N N  Vision? N N  Difficulty concentrating or making decisions? N N  Walking or climbing stairs? N N  Comment - 3-4 steps, has a handrail  Dressing or bathing? N N  Doing errands, shopping? N N  Some recent data might be hidden     Cognitive Testing  Alert? Yes  Normal Appearance?Yes  Oriented to person? Yes  Place?  Yes   Time? Yes  Recall of three objects?  Yes  Can perform simple calculations? Yes  Displays appropriate judgment?Yes  Can read the correct time from a watch face?Yes  EOL planning:     Objective:   There were no vitals filed for this visit. There is no height or weight on file to calculate BMI.  General appearance: alert, no distress, WD, frail/very thin elder  female HEENT: normocephalic, sclerae anicteric, TMs pearly, nares patent, no discharge or erythema, pharynx normal Oral cavity: MMM, no lesions Neck: supple, no lymphadenopathy, no thyromegaly, no masses Heart: RRR, normal S1, S2, no murmurs Lungs: CTA bilaterally, no wheezes, rhonchi, or rales Abdomen: +bs, soft, non tender, non distended, no masses, no hepatomegaly, no splenomegaly Musculoskeletal: L knee with some tenderness over patella and ligaments, no swelling, no obvious  deformity Extremities: no edema, no cyanosis, no clubbing Pulses: 2+ symmetric, upper and lower extremities, normal cap refill Neurological: alert, oriented x 3, CN2-12 intact, strength normal upper extremities and lower extremities, sensation normal throughout, DTRs 2+ throughout, no cerebellar signs, gait is slow with crutches Psychiatric: normal affect, behavior normal, pleasant  Breast: defer Gyn: defer Rectal: defer  Medicare Attestation I have personally reviewed: The patient's medical and social history Their use of alcohol, tobacco or illicit drugs Their current medications and supplements The patient's functional ability including ADLs,fall risks, home safety risks, cognitive, and hearing and visual impairment Diet and physical activities Evidence for depression or mood disorders  The patient's weight, height, BMI, and visual acuity have been recorded in the chart.  I have made referrals, counseling, and provided education to the patient based on review of the above and I have provided the patient with a written personalized care plan for preventive services.     Izora Ribas, NP 8:55 AM [facility]

## 2020-09-03 ENCOUNTER — Ambulatory Visit: Payer: PPO | Admitting: Adult Health

## 2020-09-03 DIAGNOSIS — Z8249 Family history of ischemic heart disease and other diseases of the circulatory system: Secondary | ICD-10-CM

## 2020-09-03 DIAGNOSIS — Z681 Body mass index (BMI) 19 or less, adult: Secondary | ICD-10-CM

## 2020-09-03 DIAGNOSIS — Z0000 Encounter for general adult medical examination without abnormal findings: Secondary | ICD-10-CM

## 2020-09-03 DIAGNOSIS — M810 Age-related osteoporosis without current pathological fracture: Secondary | ICD-10-CM

## 2020-09-03 DIAGNOSIS — E559 Vitamin D deficiency, unspecified: Secondary | ICD-10-CM

## 2020-09-03 DIAGNOSIS — E441 Mild protein-calorie malnutrition: Secondary | ICD-10-CM

## 2020-09-03 DIAGNOSIS — E782 Mixed hyperlipidemia: Secondary | ICD-10-CM

## 2020-09-03 DIAGNOSIS — R0989 Other specified symptoms and signs involving the circulatory and respiratory systems: Secondary | ICD-10-CM

## 2020-10-16 ENCOUNTER — Other Ambulatory Visit: Payer: Self-pay | Admitting: Internal Medicine

## 2020-10-16 MED ORDER — TRIAMCINOLONE ACETONIDE 0.5 % EX OINT: 1.0000 "application " | TOPICAL_OINTMENT | Freq: Two times a day (BID) | CUTANEOUS | 1 refills | Status: DC

## 2020-10-26 NOTE — Progress Notes (Signed)
MEDICARE ANNUAL WELLNESS VISIT AND FOLLOW UP Assessment:   Diagnoses and all orders for this visit:  Annual Medicare Wellness Visit Due annually  Health maintenance reviewed  Labile hypertension Doing well Monitor blood pressure in office Monitors diet, decreased sodium Will continue to monitor  Hyperlipidemia, mixed Taking Omega-3, 2,000mg  daily Discussed dietary and exercise modifications Mild elevations managed by lifestyle Check lipid panel   Age related osteoporosis, unspecified pathological fracture presence Taking Calcium and Vitamin D Supplementation, discussed vitamin K Participates in strengthening and stretching exercises at home - resume once knee allows Dexa follow up due 02/2020 - ordered to schedule with mammogram   Vitamin D deficiency Taking 5000 IU; continue supplement for goal 60-100 Continue supplementation, check levels    BMI <19, adult/ Mild protein calorie deficiency (HCC) High fat/protein diet, adequate intake discussed. Check serum protein. Discussed age appropriate cancer screening. Discussed possible benefit from remeron but declines at this time. Dietary supplements reviewed -   Unintended weight loss/ ? Worms in stool Patient requesting r/o parasites, ? Changes in stool - Ova and parasites   Follow Up Instructions:  I discussed the assessment and treatment plan with the patient. The patient was provided an opportunity to ask questions and all were answered. The patient agreed with the plan and demonstrated an understanding of the instructions.   The patient was advised to call back or seek an in-person evaluation if the symptoms worsen or if the condition fails to improve as anticipated.  I provided 30 minutes of non-face-to-face time during this encounter including counseling, chart review, and critical decision making was preformed.   Future Appointments  Date Time Provider [place]  06/04/2021 11:00 AM [name], MD  [facility] None      Plan:   During the course of the visit the patient was educated and counseled about appropriate screening and preventive services including:   Pneumococcal vaccine  Influenza vaccine Prevnar 13 Td vaccine Screening electrocardiogram, deferred, telephone visit [place]. Colorectal cancer screening Diabetes screening Glaucoma screening Nutrition counseling    Subjective:  Tricia [name] is a 75 y.o. female who presents for Medicare Annual Wellness Visit and 3 month follow up for labile HTN, hyperlipidemia, osteoporosis, and vitamin D Def.   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   Had fall in May 2021, fractures L patella, bracing only, using support and crutches as needed, Dr. Stann Mainland is following/   She had DEXA in 02/2018 which showed R fem T -4.0, was recommended fosamax but ultimately declined, doing calcium and vitamin D, was doing taichi videos 5 days a week.   BMI is Body mass index is 17.44 kg/m., she has been working on diet and exercise, doing "classical stretch" series, trying to gain weight back up to 105 lb. She has noted some increased abdominal discomfort, difficulty maintaining weight (reports baseline 105 lb), has noted some irregularities in stool, ? Worms and questions if we could r/o parasites.  Wt Readings from Last 3 Encounters:  10/29/20 93 lb 12.8 oz (42.5 kg)  05/24/20 89 lb 9.6 oz (40.6 kg)  03/28/20 92 lb 3.2 oz (41.8 kg)   Her blood pressure has been controlled at home, today their BP is   She does workout. She denies chest pain, shortness of breath, dizziness.   She is not on cholesterol medication and denies myalgias. Her cholesterol is at goal. The cholesterol last visit was:   Lab Results  Component Value Date   CHOL  202 (H) 05/24/2020   HDL 82 05/24/2020   LDLCALC 97 05/24/2020   TRIG 126 05/24/2020   CHOLHDL 2.5 05/24/2020   Last GFR Lab Results  Component Value Date   GFRNONAA 84 05/24/2020    Patient is on Vitamin D supplement.  Taking 5000 IU daily, no recent dose changes Lab Results  Component Value Date   VD25OH 83 05/24/2020       Medication Review:       Current Outpatient Medications (Other):    Ascorbic Acid (VITAMIN C PO), Take 1 tablet by mouth daily.    B Complex Vitamins (VITAMIN B COMPLEX PO), Take by mouth.   Cholecalciferol (VITAMIN D PO), Take 5,000 Units by mouth daily.    Omega-3 Fatty Acids (OMEGA 3 PO), Take 500 mg by mouth 2 (two) times daily.   zinc gluconate 50 MG tablet, Take 50 mg by mouth daily.   Multiple Vitamins-Minerals (MULTIVITAMIN PO), Take 1 tablet by mouth daily.  (Patient not taking: Reported on 10/29/2020)   OVER THE COUNTER MEDICATION, Takes K2 1 tablet daily. (Patient not taking: Reported on 10/29/2020)   Probiotic Product (PROBIOTIC DAILY PO), Take 1 tablet by mouth daily.  (Patient not taking: Reported on 10/29/2020)   triamcinolone ointment (KENALOG) 0.5 %, Apply 1 application topically 2 (two) times daily. Apply to [place] Ivy Rash 2 to 3 x /day  Allergies: Allergies  Allergen Reactions   Codeine     Emotional-Pt reports that she feels like crying   Gluten Meal    Milk-Related Compounds    Other     MSG and oats   Sulfa Antibiotics     GI problems   Iodine Rash    Patient states she had a rash with IV contrast    Current Problems (verified) has Vitamin D deficiency; Osteoporosis; FHx: heart disease; Hyperlipidemia, mixed; Labile hypertension; BMI less than 19,adult; and Mild protein-calorie malnutrition (HCC) on their problem list.  Screening Tests  There is no immunization history on file for this patient.  Preventative care: Last colonoscopy: remote Cologuard: 03/10/2018,   Mammogram 2018 Q2years, last by OBGYN - patient was declining but receptive to ordering today  DEXA: 02/2018, T-4.0 R fem, 2017 T-4.1, declined fosamax - ordered to get with mmg at breast center   Prior vaccinations: DECLINES ALL  VACCINATIONS  Names of Other Physician/Practitioners you currently use: 1. [facility] here for primary care 2. Dr. [place], 2022, monitoring mild cataracts, wears glasses 3. Dr Pablo Lawrence, 2022, goes q66m  Patient Care Team: [name], MD as PCP - General (Internal Medicine) Paula Compton, MD as Consulting Physician (Obstetrics and Gynecology) Richmond Campbell, MD as Consulting Physician (Gastroenterology)  Surgical: She  has a past surgical history that includes Ovarian cyst removal (1973); colonscopy; and ORIF humerus fracture (Right, 01/20/2019). Family Her family history includes Cancer in her father; Goiter in her mother; Heart attack in her father; Leukemia in her mother; Stroke in her father. Social history  She reports that she has never smoked. She has never used smokeless tobacco. She reports that she does not drink alcohol and does not use drugs.  MEDICARE WELLNESS OBJECTIVES: Physical activity: Current Exercise Habits: Home exercise routine, Type of exercise: strength training/weights, Time (Minutes): 25, Frequency (Times/Week): 5, Weekly Exercise (Minutes/Week): 125, Intensity: Mild, Exercise limited by: None identified Cardiac risk factors: Cardiac Risk Factors include: advanced age (>61men, >30 women);dyslipidemia;hypertension Depression/mood screen:   Depression screen [facility] 2/9 10/29/2020  Decreased Interest 0  Down,  Depressed, Hopeless 0  PHQ - 2 Score 0  Altered sleeping -  Tired, decreased energy -  Change in appetite -  Feeling bad or failure about yourself  -  Trouble concentrating -  Moving slowly or fidgety/restless -  Suicidal thoughts -  PHQ-9 Score -    ADLs:  In your present state of health, do you have any difficulty performing the following activities: 10/29/2020 05/27/2020  Hearing? N N  Vision? N N  Difficulty concentrating or making decisions? N N  Walking or climbing stairs? N N  Dressing or bathing? N N   Doing errands, shopping? N N  Some recent data might be hidden     Cognitive Testing  Alert? Yes  Normal Appearance?Yes  Oriented to person? Yes  Place? Yes   Time? Yes  Recall of three objects?  Yes  Can perform simple calculations? Yes  Displays appropriate judgment?Yes  Can read the correct time from a watch face?Yes  EOL planning: Does Patient Have a Medical Advance Directive?: No Would patient like information on creating a medical advance directive?: Yes (MAU/Ambulatory/Procedural Areas - Information given)   Objective:   Today's Vitals   10/29/20 1539  Pulse: (!) 59  Temp: 97.7 F (36.5 C)  SpO2: 99%  Weight: 93 lb 12.8 oz (42.5 kg)    Body mass index is 17.44 kg/m.  General appearance: alert, no distress, WD/WN,  female HEENT: normocephalic, sclerae anicteric, TMs pearly, nares patent, no discharge or erythema, pharynx normal Oral cavity: MMM, no lesions Neck: supple, no lymphadenopathy, no thyromegaly, no masses Heart: RRR, normal S1, S2, no murmurs Lungs: CTA bilaterally, no wheezes, rhonchi, or rales Abdomen: +bs, soft, vague generalized tenderness, non distended, no masses, no hepatomegaly, no splenomegaly Musculoskeletal: L knee with some tenderness over patella and ligaments, no swelling, mild bony enlargement. No effusion.  Extremities: no edema, no cyanosis, no clubbing Pulses: 2+ symmetric, upper and lower extremities, normal cap refill Neurological: alert, oriented x 3, CN2-12 intact, strength normal upper extremities and lower extremities, sensation normal throughout, DTRs 2+ throughout, no cerebellar signs, gait is steady.  Psychiatric: normal affect, behavior normal, pleasant   Medicare Attestation I have personally reviewed: The patient's medical and social history Their use of alcohol, tobacco or illicit drugs Their current medications and supplements The patient's functional ability including ADLs,fall risks, home safety risks, cognitive, and  hearing and visual impairment Diet and physical activities Evidence for depression or mood disorders  The patient's weight, height, BMI, and visual acuity have been recorded in the chart.  I have made referrals, counseling, and provided education to the patient based on review of the above and I have provided the patient with a written personalized care plan for preventive services.     Izora Ribas, NP 5:42 PM [facility]

## 2020-10-29 ENCOUNTER — Encounter: Payer: Self-pay | Admitting: Adult Health

## 2020-10-29 ENCOUNTER — Other Ambulatory Visit: Payer: Self-pay

## 2020-10-29 ENCOUNTER — Ambulatory Visit (INDEPENDENT_AMBULATORY_CARE_PROVIDER_SITE_OTHER): Payer: PPO | Admitting: Adult Health

## 2020-10-29 ENCOUNTER — Ambulatory Visit: Payer: PPO | Admitting: Adult Health Nurse Practitioner

## 2020-10-29 VITALS — BP 112/64 | HR 59 | Temp 97.7°F | Wt 93.8 lb

## 2020-10-29 DIAGNOSIS — E441 Mild protein-calorie malnutrition: Secondary | ICD-10-CM

## 2020-10-29 DIAGNOSIS — M810 Age-related osteoporosis without current pathological fracture: Secondary | ICD-10-CM

## 2020-10-29 DIAGNOSIS — Z0001 Encounter for general adult medical examination with abnormal findings: Secondary | ICD-10-CM

## 2020-10-29 DIAGNOSIS — Z1231 Encounter for screening mammogram for malignant neoplasm of breast: Secondary | ICD-10-CM | POA: Diagnosis not present

## 2020-10-29 DIAGNOSIS — R634 Abnormal weight loss: Secondary | ICD-10-CM | POA: Diagnosis not present

## 2020-10-29 DIAGNOSIS — R0989 Other specified symptoms and signs involving the circulatory and respiratory systems: Secondary | ICD-10-CM | POA: Diagnosis not present

## 2020-10-29 DIAGNOSIS — Z681 Body mass index (BMI) 19 or less, adult: Secondary | ICD-10-CM

## 2020-10-29 DIAGNOSIS — E559 Vitamin D deficiency, unspecified: Secondary | ICD-10-CM

## 2020-10-29 DIAGNOSIS — B839 Helminthiasis, unspecified: Secondary | ICD-10-CM

## 2020-10-29 DIAGNOSIS — E782 Mixed hyperlipidemia: Secondary | ICD-10-CM | POA: Diagnosis not present

## 2020-10-29 DIAGNOSIS — R6889 Other general symptoms and signs: Secondary | ICD-10-CM

## 2020-10-29 DIAGNOSIS — Z0000 Encounter for general adult medical examination without abnormal findings: Secondary | ICD-10-CM

## 2020-10-29 LAB — COMPLETE METABOLIC PANEL WITH GFR
AG Ratio: 2.2 (calc) (ref 1.0–2.5)
ALT: 13 U/L (ref 6–29)
AST: 23 U/L (ref 10–35)
Albumin: 4.4 g/dL (ref 3.6–5.1)
Alkaline phosphatase (APISO): 87 U/L (ref 37–153)
BUN: 8 mg/dL (ref 7–25)
CO2: 31 mmol/L (ref 20–32)
Calcium: 9.8 mg/dL (ref 8.6–10.4)
Chloride: 104 mmol/L (ref 98–110)
Creat: 0.77 mg/dL (ref 0.60–1.00)
Globulin: 2 g/dL (calc) (ref 1.9–3.7)
Glucose, Bld: 81 mg/dL (ref 65–99)
Potassium: 4.3 mmol/L (ref 3.5–5.3)
Sodium: 141 mmol/L (ref 135–146)
Total Bilirubin: 0.7 mg/dL (ref 0.2–1.2)
Total Protein: 6.4 g/dL (ref 6.1–8.1)
eGFR: 80 mL/min/{1.73_m2} (ref >=60)

## 2020-10-29 LAB — LIPID PANEL
Cholesterol: 224 mg/dL — ABNORMAL HIGH (ref <200)
HDL: 101 mg/dL (ref >=50)
LDL Cholesterol (Calc): 106 mg/dL (calc) — ABNORMAL HIGH
Non-HDL Cholesterol (Calc): 123 mg/dL (calc) (ref <130)
Total CHOL/HDL Ratio: 2.2 (calc) (ref <5.0)
Triglycerides: 77 mg/dL (ref <150)

## 2020-10-29 LAB — CBC WITH DIFFERENTIAL/PLATELET
Absolute Monocytes: 317 cells/uL (ref 200–950)
Basophils Absolute: 40 cells/uL (ref 0–200)
Basophils Relative: 1.2 %
Eosinophils Absolute: 89 cells/uL (ref 15–500)
Eosinophils Relative: 2.7 %
HCT: 37.6 % (ref 35.0–45.0)
Hemoglobin: 12.9 g/dL (ref 11.7–15.5)
Lymphs Abs: 1082 cells/uL (ref 850–3900)
MCH: 32.8 pg (ref 27.0–33.0)
MCHC: 34.3 g/dL (ref 32.0–36.0)
MCV: 95.7 fL (ref 80.0–100.0)
MPV: 9.4 fL (ref 7.5–12.5)
Monocytes Relative: 9.6 %
Neutro Abs: 1772 cells/uL (ref 1500–7800)
Neutrophils Relative %: 53.7 %
Platelets: 174 10*3/uL (ref 140–400)
RBC: 3.93 10*6/uL (ref 3.80–5.10)
RDW: 11.8 % (ref 11.0–15.0)
Total Lymphocyte: 32.8 %
WBC: 3.3 10*3/uL — ABNORMAL LOW (ref 3.8–10.8)

## 2020-10-29 LAB — MAGNESIUM: Magnesium: 2.4 mg/dL (ref 1.5–2.5)

## 2020-10-29 LAB — VITAMIN D 25 HYDROXY (VIT D DEFICIENCY, FRACTURES): Vit D, 25-Hydroxy: 75 ng/mL (ref 30–100)

## 2020-10-29 LAB — TSH: TSH: 1.05 mIU/L (ref 0.40–4.50)

## 2020-10-29 NOTE — Patient Instructions (Addendum)
HOW TO SCHEDULE A MAMMOGRAM AND BONE DENSITY   [facility]  7 a.m.-6:30 p.m., Monday 7 a.m.-5 p.m., Tuesday-Friday Schedule an appointment by calling [phone].   Chia seeds - smoothies  Ground flax seeds  Nuts, beans   High-Protein and High-Calorie Diet Eating high-protein and high-calorie foods can help you to gain weight, heal after an injury, and recover after an illness or surgery. The specific amount of daily protein and calories you need depends on: Your body weight. The reason this diet is recommended for you. Generally, a high-protein, high-calorie diet involves: Eating 250-500 extra calories each day. Making sure that you get enough of your daily calories from protein. Ask your health care provider how many of your calories should come from protein. Talk with a health care provider or a dietitian about how much protein and how many calories you need each day. Follow the diet as directed by your healthcare provider. What are tips for following this plan?  Reading food labels Check the nutrition facts label for calories, grams of fat and protein. Items with more than 4 grams of protein are high-protein foods. Preparing meals Add whole milk, half-and-half, or heavy cream to cereal, pudding, soup, or hot cocoa. Add whole milk to instant breakfast drinks. Add peanut butter to oatmeal or smoothies. Add powdered milk to baked goods, smoothies, or milkshakes. Add powdered milk, cream, or butter to mashed potatoes. Add cheese to cooked vegetables. Make whole-milk yogurt parfaits. Top them with granola, fruit, or nuts. Add cottage cheese to fruit. Add avocado, cheese, or both to sandwiches or salads. Add avocado to smoothies. Add meat, poultry, or seafood to rice, pasta, casseroles, salads, and soups. Use mayonnaise when making egg salad, chicken salad, or tuna salad. Use peanut butter as a dip for fruits and vegetables or as a topping for  pretzels, celery, or crackers. Add beans to casseroles, dips, and spreads. Add pureed beans to sauces and soups. Replace calorie-free drinks with calorie-containing drinks, such as milk and fruit juice. Replace water with milk or heavy cream when making foods such as oatmeal, pudding, or cocoa. Add oil or butter to cooked vegetables and grains. Add cream cheese to sandwiches or as a topping on crackers and bread. Make cream-based pastas and soups. General information Ask your health care provider if you should take a nutritional supplement. Try to eat six small meals each day instead of three large meals. A general goal is to eat every 2 to 3 hours. Eat a balanced diet. In each meal, include one food that is high in protein and one food with fat in it. Keep nutritious snacks available, such as nuts, trail mixes, dried fruit, and yogurt. If you have kidney disease or diabetes, talk with your health care provider about how much protein is safe for you. Too much protein may put extra stress on your kidneys. Drink your calories. Choose high-calorie drinks and have them after your meals. Consider setting a timer to remind you to eat. You will want to eat even if you do not feel very hungry. What high-protein foods should I eat?  Vegetables Soybeans. Peas. Grains Quinoa. Bulgur wheat. Buckwheat. Meats and other proteins Beef, pork, and poultry. Fish and seafood. Eggs. Tofu. Textured vegetable protein (TVP). Peanut butter. Nuts and seeds. Dried beans. Protein powders.Hummus. Dairy Whole milk. Whole-milk yogurt. Powdered milk. Cheese. [employer]. Eggnog. Beverages High-protein supplement drinks. Soy milk. Other foods Protein bars. The items listed above may not be  a complete list of foods and beverages you can eat and drink. Contact a dietitian for more information. What high-calorie foods should I eat? Fruits Dried fruit. Fruit leather. Canned fruit in syrup. Fruit juice.  Avocado. Vegetables Vegetables cooked in oil or butter. Fried potatoes. Grains Pasta. Quick breads. Muffins. Pancakes. Ready-to-eat cereal. Meats and other proteins Peanut butter. Nuts and seeds. Dairy Heavy cream. Whipped cream. Cream cheese. Sour cream. Ice cream. Custard.Pudding. Whole milk dairy products. Beverages Meal-replacement beverages. Nutrition shakes. Fruit juice. Seasonings and condiments Salad dressing. Mayonnaise. Alfredo sauce. Fruit preserves or jelly. Honey.Syrup. Sweets and desserts Cake. Cookies. Pie. Pastries. Candy bars. Chocolate. Fats and oils Butter or margarine. Oil. Gravy. Other foods Meal-replacement bars. The items listed above may not be a complete list of foods and beverages you can eat and drink. Contact a dietitian for more information. Summary A high-protein, high-calorie diet can help you gain weight or heal faster after an injury, illness, or surgery. To increase your protein and calories, add ingredients such as whole milk, peanut butter, cheese, beans, meat, or seafood to meal items. To get enough extra calories each day, include high-calorie foods and beverages at each meal. Adding a high-calorie drink or shake can be an easy way to help you get enough calories each day. Talk with your healthcare provider or dietitian about the best options for you. This information is not intended to replace advice given to you by your health care provider. Make sure you discuss any questions you have with your healthcare provider. Document Revised: 03/04/2020 Document Reviewed: 03/04/2020 Elsevier Patient Education  2022 [employer].

## 2020-10-30 DIAGNOSIS — R634 Abnormal weight loss: Secondary | ICD-10-CM | POA: Diagnosis not present

## 2020-10-30 DIAGNOSIS — B839 Helminthiasis, unspecified: Secondary | ICD-10-CM | POA: Diagnosis not present

## 2020-11-02 LAB — OVA AND PARASITE EXAMINATION
CONCENTRATE RESULT:: NONE SEEN
MICRO NUMBER:: 12137003
SPECIMEN QUALITY:: ADEQUATE
TRICHROME RESULT:: NONE SEEN

## 2020-11-19 ENCOUNTER — Ambulatory Visit (INDEPENDENT_AMBULATORY_CARE_PROVIDER_SITE_OTHER): Payer: PPO | Admitting: Internal Medicine

## 2020-11-19 ENCOUNTER — Encounter: Payer: Self-pay | Admitting: Internal Medicine

## 2020-11-19 ENCOUNTER — Other Ambulatory Visit: Payer: Self-pay

## 2020-11-19 VITALS — BP 112/60 | HR 75 | Temp 97.6°F | Resp 16 | Ht 61.5 in | Wt 94.2 lb

## 2020-11-19 DIAGNOSIS — S6010XA Contusion of unspecified finger with damage to nail, initial encounter: Secondary | ICD-10-CM

## 2020-11-19 DIAGNOSIS — I10 Essential (primary) hypertension: Secondary | ICD-10-CM

## 2020-11-19 NOTE — Progress Notes (Signed)
    Future Appointments  Date Time Provider [place]  11/19/2020  4:00 PM [name], MD [facility] None  06/04/2021 11:00 AM [name], MD [facility] None    History of Present Illness:     This delightful 75 y.o. DWF with  HTN, HLD, Prediabetes  and Vitamin D Deficiency presents for evaluation of  a tender inflamed "lump" or cyst of her Lt 5th finger. Patient denies k/o injury.   Medications    Ascorbic Acid (VITAMIN C PO), Take 1 tablet  daily.    B Complex Vitamins, Take daily   VITAMIN D, Take 5,000 Units daily.    Omega-3 OMEGA 3 , Take 500 mg 2 times daily.   zinc g50 MG tablet, Take daily.  Problem list She has Vitamin D deficiency; Osteoporosis; FHx: heart disease; Hyperlipidemia, mixed; Labile hypertension; BMI less than 19,adult; and Mild protein-calorie malnutrition (HCC) on their problem list.   Observations/Objective:  BP 112/60   Pulse 75   Temp 97.6 F (36.4 C)   Resp 16   Ht 5' 1.5" (1.562 m)   Wt 94 lb 3.2 oz (42.7 kg)   SpO2 98%   BMI 17.51 kg/m   Exam focused to the left hand find a slightly tender subcutaneous cystic type mass over the dorsum of the distal Left 5th finger.   After informed consent and aseptic with alcohol the ares was anesthetized with 0.5 ml of Marcaine 0.5 % intradermal. Then a #10 blade was used to I & D the cystic structure and black blood was expressed. Then a guaze was applied secured by band aids. Patient instructed in warm salt soaks 2//da and to return is develop signs of infection as purulent drainage, increased pain, redness or swelling.   Assessment and Plan:   1. Subungual hematoma of finger of left hand (5th finger)    Follow Up Instructions:        I discussed the assessment and treatment plan with the patient. The patient was provided an opportunity to ask questions and all were answered. The patient agreed with the plan and demonstrated an understanding of the instructions.       The patient  was advised to call back or seek an in-person evaluation if the symptoms worsen or if the condition fails to improve as anticipated.    Kirtland Bouchard, MD

## 2020-11-26 ENCOUNTER — Ambulatory Visit (INDEPENDENT_AMBULATORY_CARE_PROVIDER_SITE_OTHER): Payer: PPO | Admitting: Internal Medicine

## 2020-11-26 ENCOUNTER — Encounter: Payer: Self-pay | Admitting: Internal Medicine

## 2020-11-26 ENCOUNTER — Other Ambulatory Visit: Payer: Self-pay

## 2020-11-26 VITALS — BP 105/52 | HR 64 | Temp 97.9°F | Resp 16 | Ht 61.5 in | Wt 94.0 lb

## 2020-11-26 DIAGNOSIS — I10 Essential (primary) hypertension: Secondary | ICD-10-CM | POA: Diagnosis not present

## 2020-11-26 DIAGNOSIS — S60459A Superficial foreign body of unspecified finger, initial encounter: Secondary | ICD-10-CM | POA: Diagnosis not present

## 2020-11-26 NOTE — Progress Notes (Signed)
    Future Appointments  Date Time Provider [place]  06/04/2021 11:00 AM [name], MD [facility] None    History of Present Illness:     This very nice 75 yo DWF presents with a splinter in the tip of her Left 5th finger.  Apparently she sustained this from a piece of wood yesterday.   Medications    Ascorbic Acid (VITAMIN C PO), Take 1 tablet by mouth daily.    B Complex Vitamins (VITAMIN B COMPLEX PO), Take by mouth.   Cholecalciferol (VITAMIN D PO), Take 5,000 Units by mouth daily.    Omega-3 Fatty Acids (OMEGA 3 PO), Take 500 mg by mouth 2 (two) times daily.   zinc gluconate 50 MG tablet, Take 50 mg by mouth daily.  Problem list She has Vitamin D deficiency; Osteoporosis; FHx: heart disease; Hyperlipidemia, mixed; Labile hypertension; BMI less than 19,adult; and Mild protein-calorie malnutrition (HCC) on their problem list.   Observations/Objective:  BP (!) 105/52   Pulse 64   Temp 97.9 F (36.6 C)   Resp 16   Ht 5' 1.5" (1.562 m)   Wt 94 lb (42.6 kg)   SpO2 99%   BMI 17.47 kg/m   Exam focused on the distal Left finger and finds a small linear wood fragment embedded near a superficial laceration about the tip of the Lt 5th finger .    Procedure   ( CPT:  [id number]   )   After aseptic prep and under magnification, the wooden FB was extracted by splinter forceps. Sterile bandage was applied. Patient instructed in post-op care.   Assessment and Plan:   1. Splinter of finger without major open wound or infection, initial encounter   Follow Up Instructions:        I discussed the assessment and treatment plan with the patient. The patient was provided an opportunity to ask questions and all were answered. The patient agreed with the plan and demonstrated an understanding of the instructions.       The patient was advised to call back or seek an in-person evaluation if the symptoms worsen or if the condition fails to improve as  anticipated.   Kirtland Bouchard, MD

## 2020-12-05 ENCOUNTER — Ambulatory Visit: Payer: PPO | Admitting: Internal Medicine

## 2021-01-07 DIAGNOSIS — Z9189 Other specified personal risk factors, not elsewhere classified: Secondary | ICD-10-CM | POA: Diagnosis not present

## 2021-01-07 DIAGNOSIS — Z1152 Encounter for screening for COVID-19: Secondary | ICD-10-CM | POA: Diagnosis not present

## 2021-03-12 ENCOUNTER — Other Ambulatory Visit: Payer: Self-pay | Admitting: Adult Health

## 2021-03-12 ENCOUNTER — Encounter: Payer: Self-pay | Admitting: Adult Health

## 2021-03-12 DIAGNOSIS — Z1211 Encounter for screening for malignant neoplasm of colon: Secondary | ICD-10-CM

## 2021-03-25 DIAGNOSIS — Z1211 Encounter for screening for malignant neoplasm of colon: Secondary | ICD-10-CM | POA: Diagnosis not present

## 2021-03-26 DIAGNOSIS — M545 Low back pain, unspecified: Secondary | ICD-10-CM | POA: Diagnosis not present

## 2021-03-26 DIAGNOSIS — M546 Pain in thoracic spine: Secondary | ICD-10-CM | POA: Diagnosis not present

## 2021-04-02 LAB — COLOGUARD: COLOGUARD: NEGATIVE

## 2021-05-22 DIAGNOSIS — H2513 Age-related nuclear cataract, bilateral: Secondary | ICD-10-CM | POA: Diagnosis not present

## 2021-05-22 DIAGNOSIS — H40013 Open angle with borderline findings, low risk, bilateral: Secondary | ICD-10-CM | POA: Diagnosis not present

## 2021-05-22 DIAGNOSIS — H40003 Preglaucoma, unspecified, bilateral: Secondary | ICD-10-CM | POA: Diagnosis not present

## 2021-05-22 DIAGNOSIS — H04123 Dry eye syndrome of bilateral lacrimal glands: Secondary | ICD-10-CM | POA: Diagnosis not present

## 2021-06-04 ENCOUNTER — Encounter: Payer: PPO | Admitting: Internal Medicine

## 2021-06-25 NOTE — Progress Notes (Signed)
? ?Complete Physical Exam ?Assessment:  ? ?Diagnoses and all orders for this visit: ? ?Encounter for general adult medical examination with abnormal findings ?Due annually  ?Mammogram scheduled for 07/02/21 ? ?Labile hypertension ?Doing well ?Monitor blood pressure in office ?Monitors diet, decreased sodium ?Will continue to monitor ? ?Hyperlipidemia, mixed ?Taking Omega-3, 2,'000mg'$  daily ?Discussed dietary and exercise modifications ?Mild elevations managed by lifestyle ?Check lipid panel  ? ?Age related osteoporosis, unspecified pathological fracture presence ?Taking Calcium and Vitamin D Supplementation, discussed vitamin K ?Participates in strengthening and stretching exercises at home - resume once knee allows ?Dexa follow up due scheduled 12/06/21 ? ?Vitamin D deficiency ?Taking 5000 IU; continue supplement for goal 60-100 ?Continue supplementation, check levels  ?  ?BMI <19, adult/ Mild protein calorie deficiency ([place]) ?High fat/protein diet, adequate intake discussed. Check serum protein. Discussed age appropriate cancer screening. Discussed possible benefit from remeron but declines at this time. Dietary supplements reviewed -  ? ?Abnormal Glucose ?Continue diet and exercise ?- A1c ?  ?Medication Management ?- Magnesium ? ?Screening for ischemic heart disease ?- EKG ? ?Screening for hematuria or proteinuria ?- Routine UA with reflex microscopic ?- Microalbumin/ Creatinine urine ratio ? ?Memory change ?Given memory compensation strategies ?Encourage brain exercises daily ?Monitor ?  ?Frequency of urination ?-Urine culture ? ? ? ?I provided 30 minutes of non-face-to-face time during this encounter including counseling, chart review, and critical decision making was preformed. ? ? ?Future Appointments  ?Date Time Provider [place]  ?06/30/2022  2:00 PM Kylie Gros, Townsend Roger, NP [facility] None  ? ? ? ? ? ? ? ?Subjective:  ?Tricia [name] is a 76 y.o. female who presents for Medicare Annual Wellness Visit and 3  month follow up for labile HTN, hyperlipidemia, osteoporosis, and vitamin D Def.  ? ?She got remarried 11/30/20. He lives in [place] and currently they are splitting time between [place] and [place]. ? ?Had fall in May 2021, fractures L patella, bracing only, using support and crutches as needed, Dr. Stann Mainland is following.  She has recovered quite well and denies pain.  ? ?She has noticed frequency of urination x 2 weeks and is concerned she has a UTI.  Denies hematuria and dysuria.  ? ?She had DEXA in 02/2018 which showed R fem T -4.0, was recommended fosamax but ultimately declined. She continues to take Vit D and Calcium. Continues to perform weight bearing exercises ? ?Her memory has worsened, more difficulty with short term memory. Difficulty remembering names.  ? ?She does have TMJ. Follows with dentist. Doing behavioral changes and it has improved.  ? ?BMI is Body mass index is 17.62 kg/m?., she has been working on diet and exercise, doing "classical stretch" series, trying to gain weight back up to 105 lb. She can go a whole day without eating  ?Wt Readings from Last 3 Encounters:  ?06/26/21 94 lb 12.8 oz (43 kg)  ?11/26/20 94 lb (42.6 kg)  ?11/19/20 94 lb 3.2 oz (42.7 kg)  ? ?Her blood pressure has been controlled at home, today their BP is BP: (!) 102/52  ?BP Readings from Last 3 Encounters:  ?06/26/21 (!) 102/52  ?11/26/20 (!) 105/52  ?11/19/20 112/60  ?She does workout. She denies chest pain, shortness of breath, dizziness.  ? ?She is not on cholesterol medication and denies myalgias. Her cholesterol is at goal. She is a vegetarian. The cholesterol last visit was:   ?Lab Results  ?Component Value Date  ? CHOL 224 (H) 10/29/2020  ? HDL 101 10/29/2020  ?  LDLCALC 106 (H) 10/29/2020  ? TRIG 77 10/29/2020  ? CHOLHDL 2.2 10/29/2020  ? ?Last GFR ?Lab Results  ?Component Value Date  ? GFRNONAA 84 05/24/2020  ? ?Patient is on Vitamin D supplement.  Taking 5000 IU daily, no recent dose changes ?Lab Results  ?Component Value  Date  ? VD25OH 75 10/29/2020  ?   ? ? ?Medication Review: ? ? ? ? ? ? ?Current Outpatient Medications (Other):  ?  Ascorbic Acid (VITAMIN C PO), Take 1 tablet by mouth daily.  ?  B Complex Vitamins (VITAMIN B COMPLEX PO), Take by mouth. ?  Cholecalciferol (VITAMIN D PO), Take 5,000 Units by mouth daily.  ?  Omega-3 Fatty Acids (OMEGA 3 PO), Take 500 mg by mouth 2 (two) times daily. ?  zinc gluconate 50 MG tablet, Take 50 mg by mouth daily. ? ?Allergies: ?Allergies  ?Allergen Reactions  ? Codeine   ?  Emotional-Pt reports that she feels like crying  ? Gluten Meal   ? Milk-Related Compounds   ? Other   ?  MSG and oats  ? Sulfa Antibiotics   ?  GI problems  ? Iodine Rash  ?  Patient states she had a rash with IV contrast  ? ? ?Current Problems (verified) ?has Vitamin D deficiency; Osteoporosis; FHx: heart disease; Hyperlipidemia, mixed; Labile hypertension; BMI less than 19,adult; and Mild protein-calorie malnutrition ([place]) on their problem list. ? ?Screening Tests ? ?There is no immunization history on file for this patient. ? ?Preventative care: ?Last colonoscopy: remote ?Cologuard: 03/10/2018,  ? ?Mammogram 2018 , scheduled mammogram today ?DEXA: 02/2018, T-4.0 R fem, 2017 T-4.1, declined fosamax - ordered to get with mmg at breast center  ? ?Prior vaccinations: DECLINES ALL VACCINATIONS ? ?Names of Other Physician/Practitioners you currently use: ?1. [facility] here for primary care ?2. Dr. [place], 05/2021, monitoring mild cataracts, wears glasses ?3. Dr Pablo Lawrence, has appointment 06/2021, goes q34m? ?Patient Care Team: ?MUnk Pinto MD as PCP - General (Internal Medicine) ?RPaula Compton MD as Consulting Physician (Obstetrics and Gynecology) ?MRichmond Campbell MD as Consulting Physician (Gastroenterology) ? ?Surgical: ?She  has a past surgical history that includes Ovarian cyst removal (1973); colonscopy; and ORIF humerus fracture (Right, 01/20/2019). ?Family ?Her family  history includes Cancer in her father; Goiter in her mother; Heart attack in her father; Leukemia in her mother; Stroke in her father. ?Social history  ?She reports that she has never smoked. She has never used smokeless tobacco. She reports that she does not drink alcohol and does not use drugs. ? ? ?Review of Systems  ?Constitutional:  Negative for chills and fever.  ?HENT:  Negative for congestion, hearing loss, sinus pain, sore throat and tinnitus.   ?Eyes:  Negative for blurred vision and double vision.  ?Respiratory:  Negative for cough, hemoptysis, sputum production, shortness of breath and wheezing.   ?Cardiovascular:  Negative for chest pain, palpitations and leg swelling.  ?Gastrointestinal:  Negative for abdominal pain, constipation, diarrhea, heartburn, nausea and vomiting.  ?Genitourinary:  Positive for frequency. Negative for dysuria and urgency.  ?Musculoskeletal:  Negative for back pain, falls, joint pain, myalgias and neck pain.  ?Skin:  Negative for rash.  ?Neurological:  Negative for dizziness, tingling, tremors, weakness and headaches.  ?Endo/Heme/Allergies:  Does not bruise/bleed easily.  ?Psychiatric/Behavioral:  Positive for memory loss. Negative for depression and suicidal ideas. The patient is not nervous/anxious and does not have insomnia.   ? ? ?Objective:  ? ?Today's Vitals  ?  06/26/21 1408  ?BP: (!) 102/52  ?Pulse: 74  ?Temp: 97.7 ?F (36.5 ?C)  ?SpO2: 96%  ?Weight: 94 lb 12.8 oz (43 kg)  ?Height: 5' 1.5" (1.562 m)  ? ? ? ?Body mass index is 17.62 kg/m?. ? ?General appearance: pleasant thin female in no apparent distress ?HEENT: normocephalic, sclerae anicteric, TMs pearly, nares patent, no discharge or erythema, pharynx normal ?Oral cavity: MMM, no lesions ?Neck: supple, no lymphadenopathy, no thyromegaly, no masses ?Heart: RRR, normal S1, S2, no murmurs ?Lungs: CTA bilaterally, no wheezes, rhonchi, or rales ?Abdomen: +bs, soft, vague generalized tenderness, non distended, no masses, no  hepatomegaly, no splenomegaly ?Musculoskeletal: Normal strength and range of motion. No CVA tenderness ?Extremities: no edema, no cyanosis, no clubbing ?Pulses: 2+ symmetric, upper and lower extremities, nor

## 2021-06-26 ENCOUNTER — Other Ambulatory Visit: Payer: Self-pay

## 2021-06-26 ENCOUNTER — Ambulatory Visit (INDEPENDENT_AMBULATORY_CARE_PROVIDER_SITE_OTHER): Payer: PPO | Admitting: Nurse Practitioner

## 2021-06-26 ENCOUNTER — Other Ambulatory Visit: Payer: Self-pay | Admitting: Adult Health

## 2021-06-26 ENCOUNTER — Encounter: Payer: Self-pay | Admitting: Nurse Practitioner

## 2021-06-26 VITALS — BP 102/52 | HR 74 | Temp 97.7°F | Ht 61.5 in | Wt 94.8 lb

## 2021-06-26 DIAGNOSIS — Z1389 Encounter for screening for other disorder: Secondary | ICD-10-CM

## 2021-06-26 DIAGNOSIS — Z681 Body mass index (BMI) 19 or less, adult: Secondary | ICD-10-CM

## 2021-06-26 DIAGNOSIS — E559 Vitamin D deficiency, unspecified: Secondary | ICD-10-CM

## 2021-06-26 DIAGNOSIS — Z79899 Other long term (current) drug therapy: Secondary | ICD-10-CM | POA: Diagnosis not present

## 2021-06-26 DIAGNOSIS — M810 Age-related osteoporosis without current pathological fracture: Secondary | ICD-10-CM

## 2021-06-26 DIAGNOSIS — E782 Mixed hyperlipidemia: Secondary | ICD-10-CM

## 2021-06-26 DIAGNOSIS — Z0001 Encounter for general adult medical examination with abnormal findings: Secondary | ICD-10-CM

## 2021-06-26 DIAGNOSIS — R0989 Other specified symptoms and signs involving the circulatory and respiratory systems: Secondary | ICD-10-CM

## 2021-06-26 DIAGNOSIS — E441 Mild protein-calorie malnutrition: Secondary | ICD-10-CM

## 2021-06-26 DIAGNOSIS — R350 Frequency of micturition: Secondary | ICD-10-CM | POA: Diagnosis not present

## 2021-06-26 DIAGNOSIS — Z136 Encounter for screening for cardiovascular disorders: Secondary | ICD-10-CM

## 2021-06-26 DIAGNOSIS — Z0000 Encounter for general adult medical examination without abnormal findings: Secondary | ICD-10-CM | POA: Diagnosis not present

## 2021-06-26 DIAGNOSIS — R7309 Other abnormal glucose: Secondary | ICD-10-CM | POA: Diagnosis not present

## 2021-06-26 DIAGNOSIS — R413 Other amnesia: Secondary | ICD-10-CM

## 2021-06-26 NOTE — Patient Instructions (Signed)

## 2021-06-27 LAB — URINALYSIS, ROUTINE W REFLEX MICROSCOPIC
Bacteria, UA: NONE SEEN /HPF
Bilirubin Urine: NEGATIVE
Glucose, UA: NEGATIVE
Hgb urine dipstick: NEGATIVE
Hyaline Cast: NONE SEEN /LPF
Ketones, ur: NEGATIVE
Nitrite: NEGATIVE
Protein, ur: NEGATIVE
RBC / HPF: NONE SEEN /HPF (ref 0–2)
Specific Gravity, Urine: 1.01 (ref 1.001–1.035)
WBC, UA: NONE SEEN /HPF (ref 0–5)
pH: 7.5 (ref 5.0–8.0)

## 2021-06-27 LAB — CBC WITH DIFFERENTIAL/PLATELET
Absolute Monocytes: 252 cells/uL (ref 200–950)
Basophils Absolute: 42 cells/uL (ref 0–200)
Basophils Relative: 1.2 %
Eosinophils Absolute: 102 cells/uL (ref 15–500)
Eosinophils Relative: 2.9 %
HCT: 37.4 % (ref 35.0–45.0)
Hemoglobin: 12.9 g/dL (ref 11.7–15.5)
Lymphs Abs: 851 cells/uL (ref 850–3900)
MCH: 33 pg (ref 27.0–33.0)
MCHC: 34.5 g/dL (ref 32.0–36.0)
MCV: 95.7 fL (ref 80.0–100.0)
MPV: 9.6 fL (ref 7.5–12.5)
Monocytes Relative: 7.2 %
Neutro Abs: 2254 cells/uL (ref 1500–7800)
Neutrophils Relative %: 64.4 %
Platelets: 170 10*3/uL (ref 140–400)
RBC: 3.91 10*6/uL (ref 3.80–5.10)
RDW: 11.6 % (ref 11.0–15.0)
Total Lymphocyte: 24.3 %
WBC: 3.5 10*3/uL — ABNORMAL LOW (ref 3.8–10.8)

## 2021-06-27 LAB — MICROALBUMIN / CREATININE URINE RATIO
Creatinine, Urine: 42 mg/dL (ref 20–275)
Microalb, Ur: <0.2 mg/dL

## 2021-06-27 LAB — COMPLETE METABOLIC PANEL WITH GFR
AG Ratio: 2.2 (calc) (ref 1.0–2.5)
ALT: 14 U/L (ref 6–29)
AST: 26 U/L (ref 10–35)
Albumin: 4.6 g/dL (ref 3.6–5.1)
Alkaline phosphatase (APISO): 91 U/L (ref 37–153)
BUN: 13 mg/dL (ref 7–25)
CO2: 30 mmol/L (ref 20–32)
Calcium: 10 mg/dL (ref 8.6–10.4)
Chloride: 104 mmol/L (ref 98–110)
Creat: 0.91 mg/dL (ref 0.60–1.00)
Globulin: 2.1 g/dL (calc) (ref 1.9–3.7)
Glucose, Bld: 100 mg/dL — ABNORMAL HIGH (ref 65–99)
Potassium: 4.1 mmol/L (ref 3.5–5.3)
Sodium: 141 mmol/L (ref 135–146)
Total Bilirubin: 0.7 mg/dL (ref 0.2–1.2)
Total Protein: 6.7 g/dL (ref 6.1–8.1)
eGFR: 66 mL/min/{1.73_m2} (ref >=60)

## 2021-06-27 LAB — VITAMIN D 25 HYDROXY (VIT D DEFICIENCY, FRACTURES): Vit D, 25-Hydroxy: 98 ng/mL (ref 30–100)

## 2021-06-27 LAB — MICROSCOPIC MESSAGE

## 2021-06-27 LAB — LIPID PANEL
Cholesterol: 224 mg/dL — ABNORMAL HIGH (ref <200)
HDL: 97 mg/dL (ref >=50)
LDL Cholesterol (Calc): 104 mg/dL (calc) — ABNORMAL HIGH
Non-HDL Cholesterol (Calc): 127 mg/dL (calc) (ref <130)
Total CHOL/HDL Ratio: 2.3 (calc) (ref <5.0)
Triglycerides: 132 mg/dL (ref <150)

## 2021-06-27 LAB — URINE CULTURE
MICRO NUMBER:: 13134873
SPECIMEN QUALITY:: ADEQUATE

## 2021-06-27 LAB — HEMOGLOBIN A1C
Hgb A1c MFr Bld: 5.2 % of total Hgb (ref <5.7)
Mean Plasma Glucose: 103 mg/dL
eAG (mmol/L): 5.7 mmol/L

## 2021-06-27 LAB — MAGNESIUM: Magnesium: 2.6 mg/dL — ABNORMAL HIGH (ref 1.5–2.5)

## 2021-06-27 LAB — TSH: TSH: 0.98 mIU/L (ref 0.40–4.50)

## 2021-07-02 ENCOUNTER — Ambulatory Visit: Payer: PPO

## 2021-07-03 ENCOUNTER — Encounter: Payer: Self-pay | Admitting: Student

## 2021-07-03 ENCOUNTER — Telehealth: Payer: Self-pay | Admitting: Student

## 2021-07-03 ENCOUNTER — Other Ambulatory Visit: Payer: Self-pay

## 2021-07-03 ENCOUNTER — Ambulatory Visit: Payer: PPO | Admitting: Student

## 2021-07-03 VITALS — BP 125/65 | HR 74 | Temp 98.1°F | Resp 17 | Ht 61.5 in | Wt 93.8 lb

## 2021-07-03 DIAGNOSIS — R5383 Other fatigue: Secondary | ICD-10-CM | POA: Diagnosis not present

## 2021-07-03 DIAGNOSIS — R7303 Prediabetes: Secondary | ICD-10-CM

## 2021-07-03 DIAGNOSIS — K219 Gastro-esophageal reflux disease without esophagitis: Secondary | ICD-10-CM

## 2021-07-03 DIAGNOSIS — R072 Precordial pain: Secondary | ICD-10-CM

## 2021-07-03 DIAGNOSIS — R079 Chest pain, unspecified: Secondary | ICD-10-CM | POA: Diagnosis not present

## 2021-07-03 NOTE — Progress Notes (Signed)
? ?Primary Physician/Referring:  [name], MD ? ?Patient ID: Tricia [name], female    DOB: [birth year], 76 y.o.   MRN: [id number] ? ?Chief Complaint  ?Patient presents with  ? New Patient (Initial Visit)  ? Chest Pain  ? ?HPI:   ? ?Tricia [name]  is a 76 y.o. Caucasian female with recently diagnosed prediabetes, brief history of tobacco use in her 14s, and occasional GERD.  Denies history of hypertension, hyperlipidemia, family history of premature CAD. ? ?Patient is typically relatively active exercising 3 days/week, however she does note over the last few weeks she has not been active as she and her husband have been traveling.  She eats a primarily plant-based diet and overall is careful to try to eat heart healthy.  She does admit due to recent travel she is eating more sugar and carbs than typical. ? ?Patient presents to the office with concerns of intermittent neck and shoulder pain days ago which she describes as sharp, lasting several seconds.  She then this morning developed primarily right-sided chest pain which she stated felt like reflux, however it did not resolve when she relaxed and therefore this prompted her to seek dilation.  This pain lasted approximately 30 minutes.  She denies associated symptoms with the exception of a headache which she continues to have during today's visit.  Upon further questioning patient does note intermittent episodes of substernal chest pain which are brief and tend to be associated with anxiety provoking situations over the last several months. ? ?Denies dyspnea, syncope, near syncope, orthopnea. ? ?Past Medical History:  ?Diagnosis Date  ? Proximal humerus fracture 01/20/2019  ? Tachycardia   ? Related to Epinephrine used for dental procedure  ? ?Past Surgical History:  ?Procedure Laterality Date  ? colonscopy    ? ORIF HUMERUS FRACTURE Right 01/20/2019  ? Procedure: OPEN REDUCTION INTERNAL FIXATION (ORIF) PROXIMAL HUMERUS FRACTURE;  Surgeon: Justice Britain,  MD;  Location: [facility];  Service: Orthopedics;  Laterality: Right;  117mn  ? OVARIAN CYST REMOVAL  1973  ? ?Family History  ?Problem Relation Age of Onset  ? Leukemia Mother   ? Goiter Mother   ? Stroke Father   ? Cancer Father   ? Heart attack Father   ? Heart disease Brother   ? Parkinsonism Brother   ?  ?Social History  ? ?Tobacco Use  ? Smoking status: Former  ?  Packs/day: 0.25  ?  Types: Cigarettes  ? Smokeless tobacco: Never  ? Tobacco comments:  ?  Smoked cigarettes when she was younger   ?Substance Use Topics  ? Alcohol use: No  ? ?Marital Status: Single  ? ?ROS  ?Review of Systems  ?Constitutional: Positive for malaise/fatigue.  ?Cardiovascular:  Positive for chest pain. Negative for claudication, dyspnea on exertion, leg swelling, near-syncope, orthopnea, palpitations, paroxysmal nocturnal dyspnea and syncope.  ?Neurological:  Negative for dizziness.  ? ?Objective  ?Blood pressure 125/65, pulse 74, temperature 98.1 ?F (36.7 ?C), temperature source Temporal, resp. rate 17, height 5' 1.5" (1.562 m), weight 93 lb 12.8 oz (42.5 kg), SpO2 98 %.  ? ?  07/03/2021  ?  8:39 AM 06/26/2021  ?  2:08 PM 11/26/2020  ?  4:52 PM  ?Vitals with BMI  ?Height 5' 1.5" 5' 1.5" 5' 1.5"  ?Weight 93 lbs 13 oz 94 lbs 13 oz 94 lbs  ?BMI 17.44 17.62 17.48  ?Systolic [id number] ?Diastolic 65 52 52  ?Pulse 74 74 64  ?  ? ?  Physical Exam ?Vitals reviewed.  ?Neck:  ?   Vascular: No carotid bruit.  ?Cardiovascular:  ?   Rate and Rhythm: Normal rate and regular rhythm.  ?   Pulses: Intact distal pulses.     ?     Carotid pulses are 2+ on the right side and 2+ on the left side. ?     Radial pulses are 2+ on the right side and 2+ on the left side.  ?     Femoral pulses are 2+ on the right side and 2+ on the left side. ?     Popliteal pulses are 2+ on the right side and 2+ on the left side.  ?     Dorsalis pedis pulses are 2+ on the right side and 2+ on the left side.  ?     Posterior tibial pulses are 2+ on the right side and 2+ on the left  side.  ?   Heart sounds: S1 normal and S2 normal. No murmur heard. ?  No gallop.  ?Pulmonary:  ?   Effort: Pulmonary effort is normal. No respiratory distress.  ?   Breath sounds: No wheezing, rhonchi or rales.  ?Musculoskeletal:  ?   Right lower leg: No edema.  ?   Left lower leg: No edema.  ?Neurological:  ?   General: No focal deficit present.  ? ? ?Laboratory examination:  ? ?estimated creatinine clearance is 35.8 mL/min (by C-G formula based on SCr of 0.91 mg/dL).  ? ?  Latest Ref Rng & Units 06/26/2021  ?  3:08 PM 10/29/2020  ?  4:31 PM 05/24/2020  ?  3:04 PM  ?CMP  ?Glucose 65 - 99 mg/dL 100   81   84    ?BUN 7 - 25 mg/dL '13   8   10    '$ ?Creatinine 0.60 - 1.00 mg/dL 0.91   0.77   0.71    ?Sodium 135 - 146 mmol/L 141   141   141    ?Potassium 3.5 - 5.3 mmol/L 4.1   4.3   4.1    ?Chloride 98 - 110 mmol/L 104   104   105    ?CO2 20 - 32 mmol/L '30   31   29    '$ ?Calcium 8.6 - 10.4 mg/dL 10.0   9.8   9.8    ?Total Protein 6.1 - 8.1 g/dL 6.7   6.4   6.4    ?Total Bilirubin 0.2 - 1.2 mg/dL 0.7   0.7   0.6    ?AST 10 - 35 U/L '26   23   24    '$ ?ALT 6 - 29 U/L '14   13   14    '$ ? ? ?  Latest Ref Rng & Units 06/26/2021  ?  3:08 PM 10/29/2020  ?  4:31 PM 05/24/2020  ?  3:04 PM  ?CBC  ?WBC 3.8 - 10.8 Thousand/uL 3.5   3.3   3.4    ?Hemoglobin 11.7 - 15.5 g/dL 12.9   12.9   13.0    ?Hematocrit 35.0 - 45.0 % 37.4   37.6   36.7    ?Platelets 140 - 400 Thousand/uL 170   174   168    ? ? ?Lipid Panel ?Recent Labs  ?  10/29/20 ?1631 06/26/21 ?1508  ?CHOL 224* 224*  ?TRIG 77 132  ?LDLCALC 106* 104*  ?HDL 101 97  ?CHOLHDL 2.2 2.3  ? ? ?HEMOGLOBIN A1C ?Lab Results  ?Component  Value Date  ? HGBA1C 5.2 06/26/2021  ? MPG 103 06/26/2021  ? ?TSH ?Recent Labs  ?  10/29/20 ?1631 06/26/21 ?1508  ?TSH 1.05 0.98  ? ? ?External labs:  ?None ? ?Allergies  ? ?Allergies  ?Allergen Reactions  ? Codeine   ?  Emotional-Pt reports that she feels like crying  ? Gluten Meal   ? Milk-Related Compounds   ? Other   ?  MSG and oats  ? Sulfa Antibiotics   ?  GI  problems  ? Iodine Rash  ?  Patient states she had a rash with IV contrast  ?  ?Medications Prior to Visit:  ? ?Outpatient Medications Prior to Visit  ?Medication Sig Dispense Refill  ? Ascorbic Acid (VITAMIN C PO) Take 1 tablet by mouth daily.     ? B Complex Vitamins (VITAMIN B COMPLEX PO) Take by mouth.    ? Calcium Carbonate (CALCIUM 500 PO) Take 2,000 mg by mouth daily.    ? Cholecalciferol (VITAMIN D PO) Take 5,000 Units by mouth daily.     ? Homeopathic Products [facility] ALLERGY EYE RELIEF OP) Apply 1 drop to eye at bedtime.    ? NON FORMULARY Take 1 tablet by mouth in the morning, at noon, and at bedtime. THYTROPHEN    ? Omega-3 Fatty Acids (OMEGA 3 PO) Take 500 mg by mouth 2 (two) times daily.    ? zinc gluconate 50 MG tablet Take 50 mg by mouth daily.    ? NEOMYCIN-POLYMYXIN-HC, OPHTH, SUSP neomycin 3.5 mg-polymyxin 10,000 unit-hydrocort 10 mg/mL eye drop,susp    ? ?No facility-administered medications prior to visit.  ? ?Final Medications at End of Visit   ? ?Current Meds  ?Medication Sig  ? Ascorbic Acid (VITAMIN C PO) Take 1 tablet by mouth daily.   ? B Complex Vitamins (VITAMIN B COMPLEX PO) Take by mouth.  ? Calcium Carbonate (CALCIUM 500 PO) Take 2,000 mg by mouth daily.  ? Cholecalciferol (VITAMIN D PO) Take 5,000 Units by mouth daily.   ? Homeopathic Products [facility] ALLERGY EYE RELIEF OP) Apply 1 drop to eye at bedtime.  ? NON FORMULARY Take 1 tablet by mouth in the morning, at noon, and at bedtime. THYTROPHEN  ? Omega-3 Fatty Acids (OMEGA 3 PO) Take 500 mg by mouth 2 (two) times daily.  ? zinc gluconate 50 MG tablet Take 50 mg by mouth daily.  ? ?Radiology:  ? ?No results found. ? ?Cardiac Studies:  ? ?None ? ?EKG:  ? ?07/03/2021: Sinus rhythm at a rate of 68 beats per minute.  Normal axis.  Nonspecific T wave abnormality.  No evidence of ischemia or underlying injury pattern. ? ?Assessment  ? ?  ICD-10-CM   ?1. Precordial pain  R07.2 EKG 12-Lead  ?  PCV CARDIAC STRESS TEST  ?  ?2. Other  fatigue  R53.83   ?  ?3. Prediabetes  R73.03   ?  ?  ? ?Medications Discontinued During This Encounter  ?Medication Reason  ? NEOMYCIN-POLYMYXIN-HC, OPHTH, SUSP   ?  ?No orders of the defined types were placed

## 2021-07-03 NOTE — Telephone Encounter (Signed)
ON-CALL CARDIOLOGY ?07/03/2021 ? ?Patient's name: Tricia [name].   ?MRN: [id number].    ?DOB: 23-Nov-1945 ?Primary care provider: [name], MD. ?Primary cardiologist: Lawerance Cruel, PA-C, Dr. Einar Gip  ? ?Interaction regarding this patient's care today: ?Patient called complaining of burning pain in her stomach and frequent belching.  She has full recommendations regarding medicine to treat reflux.  ? ?Impression: ?  ICD-10-CM   ?1. Gastroesophageal reflux disease, unspecified whether esophagitis present  K21.9   ?  ? ? ?No orders of the defined types were placed in this encounter. ? ? ?No orders of the defined types were placed in this encounter. ? ? ?Recommendations: ?Advised patient that she may take over-the-counter GERD medication such as omeprazole.  Also advised her regarding avoidance of triggering foods.  Counseled patient regarding signs and symptoms that would warrant urgent or emergent evaluation, she verbalized understanding agreement. ? ?Telephone encounter total time: 7 minutes ? ? ? ?Alethia Berthold, PA-C ?07/04/2021, 12:11 PM ?Office: [phone] ? ?

## 2021-07-04 ENCOUNTER — Telehealth: Payer: Self-pay

## 2021-07-04 NOTE — Progress Notes (Signed)
THIS ENCOUNTER IS A VIRTUAL VISIT DUE TO COVID-19 - PATIENT WAS NOT SEEN IN THE OFFICE.  ?PATIENT HAS CONSENTED TO VIRTUAL VISIT / TELEMEDICINE VISIT ? ? ?Virtual Visit via telephone Note ? ?I connected with  Tricia [name] on 07/05/2021 by telephone.  ?I verified that I am speaking with the correct person using two identifiers.  ?  ?I discussed the limitations of evaluation and management by telemedicine and the availability of in person appointments. The patient expressed understanding and agreed to proceed. ? ?History of Present Illness: ? ?Pulse 87   Temp 97.7 ?F (36.5 ?C)   SpO2 95%  ?76 y.o. patient contacted office reporting URI sx . she tested positive by test in parking lot of office. OV was conducted by telephone to minimize exposure. This patient was not  vaccinated for covid 19 ? ?Sx began 2 days ago with Fever, headache, body aches, productive cough of yellow mucus, sinus congestion and pressure ? ?Treatments tried so far: Clear lungs ? ?Exposures: unknown ? ? ?Medications ? ? ? ? ? ? ?Current Outpatient Medications (Other):  ?  Ascorbic Acid (VITAMIN C PO), Take 1 tablet by mouth daily.  ?  B Complex Vitamins (VITAMIN B COMPLEX PO), Take by mouth. ?  Calcium Carbonate (CALCIUM 500 PO), Take 2,000 mg by mouth daily. ?  Cholecalciferol (VITAMIN D PO), Take 5,000 Units by mouth daily.  ?  Homeopathic Products [facility] ALLERGY EYE RELIEF OP), Apply 1 drop to eye at bedtime. ?  NON FORMULARY, Take 1 tablet by mouth in the morning, at noon, and at bedtime. THYTROPHEN ?  Omega-3 Fatty Acids (OMEGA 3 PO), Take 500 mg by mouth 2 (two) times daily. ?  zinc gluconate 50 MG tablet, Take 50 mg by mouth daily. ? ?Allergies:  ?Allergies  ?Allergen Reactions  ? Codeine   ?  Emotional-Pt reports that she feels like crying  ? Gluten Meal   ? Milk-Related Compounds   ? Other   ?  MSG and oats  ? Sulfa Antibiotics   ?  GI problems  ? Iodine Rash  ?  Patient states she had a rash with IV contrast  ? ? ?Problem  list ?She has Vitamin D deficiency; Osteoporosis; FHx: heart disease; Hyperlipidemia, mixed; Labile hypertension; BMI less than 19,adult; and Mild protein-calorie malnutrition ([place]) on their problem list. ?  ?Social History:  ? reports that she has quit smoking. Her smoking use included cigarettes. She smoked an average of .25 packs per day. She has never used smokeless tobacco. She reports that she does not drink alcohol and does not use drugs. ? ?Observations/Objective: ? ?General : Well sounding patient in no apparent distress ?HEENT: no hoarseness, no cough for duration of visit ?Lungs: speaks in complete sentences, no audible wheezing, no apparent distress ?Neurological: alert, oriented x 3 ?Psychiatric: pleasant, judgement appropriate  ? ?Assessment and Plan: ? ?Tricia [name] was seen today for acute visit. ? ?Diagnoses and all orders for this visit: ? ?Flu-like symptoms ?-     POCT Influenza A/B ? Negative ? ?Encounter for screening for COVID-19 ?-     POC COVID-19 ? Positive ? ?COVID-19 ?-     azithromycin (ZITHROMAX) 250 MG tablet; Take 2 tablets (500 mg) on  Day 1,  followed by 1 tablet (250 mg) once daily on Days 2 through 5. ?-     dexamethasone (DECADRON) 1 MG tablet; Take 3 tabs for 3 days, 2 tabs for 3 days 1 tab for 5 days. Take  with food. ?-     promethazine-dextromethorphan (PROMETHAZINE-DM) 6.25-15 MG/5ML syrup; Take 5 mLs by mouth 4 (four) times daily as needed for cough. ?-     molnupiravir EUA (LAGEVRIO) 200 mg CAPS capsule; Take 4 capsules (800 mg total) by mouth 2 (two) times daily for 5 days. ? ?  ?Covid 19 positive per rapid screening test in today in parking lot ?Risk factors include: labile hypertension ?Symptoms are: mild ?Due to co morbid conditions and risk factors, discussed antivirals  ?Immue support reviewed  ?Take tylenol PRN temp 101+ ?Push hydration ?Regular ambulation or calf exercises exercises for clot prevention and 81 mg ASA unless contraindicated ?Sx supportive therapy  suggested ?Follow up via mychart or telephone if needed ?Advised patient obtain O2 monitor; present to ED if persistently <90% or with severe dyspnea, CP, fever uncontrolled by tylenol, confusion, sudden decline ?Should remain in isolation until at least 5 days from onset of sx, 24-48 hours fever free without tylenol, sx such as cough are improved. Should then wear a mask the following 5 days around other people ? ? ? ? ?Follow Up Instructions: ? ?I discussed the assessment and treatment plan with the patient. The patient was provided an opportunity to ask questions and all were answered. The patient agreed with the plan and demonstrated an understanding of the instructions. ?  ?The patient was advised to call back or seek an in-person evaluation if the symptoms worsen or if the condition fails to improve as anticipated. ? ?I provided 20 minutes of non-face-to-face time during this encounter. ? ? ?Magda Bernheim, NP  ?

## 2021-07-04 NOTE — Telephone Encounter (Signed)
Patient called after hours, stated that was having very painful acid reflux with a fever of 100.4. Can you please give her a call?  ?

## 2021-07-05 ENCOUNTER — Encounter: Payer: Self-pay | Admitting: Nurse Practitioner

## 2021-07-05 ENCOUNTER — Other Ambulatory Visit: Payer: Self-pay

## 2021-07-05 ENCOUNTER — Ambulatory Visit: Payer: PPO | Admitting: Nurse Practitioner

## 2021-07-05 VITALS — HR 87 | Temp 97.7°F

## 2021-07-05 DIAGNOSIS — Z1152 Encounter for screening for COVID-19: Secondary | ICD-10-CM | POA: Diagnosis not present

## 2021-07-05 DIAGNOSIS — R6889 Other general symptoms and signs: Secondary | ICD-10-CM

## 2021-07-05 DIAGNOSIS — U071 COVID-19: Secondary | ICD-10-CM | POA: Diagnosis not present

## 2021-07-05 LAB — POCT INFLUENZA A/B
Influenza A, POC: NEGATIVE
Influenza B, POC: NEGATIVE

## 2021-07-05 LAB — POC COVID19 BINAXNOW: SARS Coronavirus 2 Ag: POSITIVE — AB

## 2021-07-05 MED ORDER — PROMETHAZINE-DM 6.25-15 MG/5ML PO SYRP: 5.0000 mL | ORAL_SOLUTION | Freq: Four times a day (QID) | ORAL | 0 refills | Status: DC | PRN

## 2021-07-05 MED ORDER — DEXAMETHASONE 1 MG PO TABS: ORAL_TABLET | ORAL | 0 refills | Status: DC

## 2021-07-05 MED ORDER — AZITHROMYCIN 250 MG PO TABS: ORAL_TABLET | ORAL | 1 refills | Status: DC

## 2021-07-05 MED ORDER — MOLNUPIRAVIR EUA 200MG CAPSULE: 4.0000 | ORAL_CAPSULE | Freq: Two times a day (BID) | ORAL | 0 refills | Status: AC

## 2021-07-05 NOTE — Telephone Encounter (Signed)
I spoke with her that evening. Please refer to telephone encounter

## 2021-07-05 NOTE — Patient Instructions (Signed)
COVID-19 ?-     azithromycin (ZITHROMAX) 250 MG tablet; Take 2 tablets (500 mg) on  Day 1,  followed by 1 tablet (250 mg) once daily on Days 2 through 5. ?-     dexamethasone (DECADRON) 1 MG tablet; Take 3 tabs for 3 days, 2 tabs for 3 days 1 tab for 5 days. Take with food. ?-     promethazine-dextromethorphan (PROMETHAZINE-DM) 6.25-15 MG/5ML syrup; Take 5 mLs by mouth 4 (four) times daily as needed for cough. ?-     molnupiravir EUA (LAGEVRIO) 200 mg CAPS capsule; Take 4 capsules (800 mg total) by mouth 2 (two) times daily for 5 days. ? ?  ?Covid 19 positive per rapid screening test in today in parking lot ?Risk factors include: labile hypertension ?Symptoms are: mild ?Due to co morbid conditions and risk factors, discussed antivirals  ?Immue support reviewed  ?Take tylenol PRN temp 101+ ?Push hydration ?Regular ambulation or calf exercises exercises for clot prevention and 81 mg ASA unless contraindicated ?Sx supportive therapy suggested ?Follow up via mychart or telephone if needed ?Advised patient obtain O2 monitor; present to ED if persistently <90% or with severe dyspnea, CP, fever uncontrolled by tylenol, confusion, sudden decline ?Should remain in isolation until at least 5 days from onset of sx, 24-48 hours fever free without tylenol, sx such as cough are improved. Should then wear a mask the following 5 days around other people ?

## 2021-07-18 ENCOUNTER — Telehealth: Payer: Self-pay | Admitting: Student

## 2021-07-18 DIAGNOSIS — R072 Precordial pain: Secondary | ICD-10-CM

## 2021-07-18 NOTE — Telephone Encounter (Signed)
Patient requesting order for calcium score. There is no order currently, so once that's been done, I can call her and let her know so she can schedule. ?

## 2021-07-18 NOTE — Telephone Encounter (Signed)
Order is in.

## 2021-08-02 ENCOUNTER — Ambulatory Visit: Payer: PPO | Admitting: Student

## 2021-08-09 ENCOUNTER — Ambulatory Visit: Payer: PPO

## 2021-08-09 ENCOUNTER — Ambulatory Visit: Payer: PPO | Admitting: Student

## 2021-08-09 DIAGNOSIS — R072 Precordial pain: Secondary | ICD-10-CM | POA: Diagnosis not present

## 2021-08-09 NOTE — Progress Notes (Deleted)
Primary Physician/Referring:  [name], MD  Patient ID: Tricia [name], female    DOB: 20-Sep-1945, 76 y.o.   MRN: [id number]  No chief complaint on file.  HPI:    Tricia [name]  is a 76 y.o. Caucasian female with recently diagnosed prediabetes, brief history of tobacco use in her 27s, and occasional GERD.  Denies history of hypertension, hyperlipidemia, family history of premature CAD.  Patient is typically relatively active exercising 3 days/week, however she does note over the last few weeks she has not been active as she and her husband have been traveling.  She eats a primarily plant-based diet and overall is careful to try to eat heart healthy.  She does admit due to recent travel she is eating more sugar and carbs than typical.  Patient presents to the office with concerns of intermittent neck and shoulder pain days ago which she describes as sharp, lasting several seconds.  She then this morning developed primarily right-sided chest pain which she stated felt like reflux, however it did not resolve when she relaxed and therefore this prompted her to seek dilation.  This pain lasted approximately 30 minutes.  She denies associated symptoms with the exception of a headache which she continues to have during today's visit.  Upon further questioning patient does note intermittent episodes of substernal chest pain which are brief and tend to be associated with anxiety provoking situations over the last several months.  Denies dyspnea, syncope, near syncope, orthopnea.  Past Medical History:  Diagnosis Date   Proximal humerus fracture 01/20/2019   Tachycardia    Related to Epinephrine used for dental procedure   Past Surgical History:  Procedure Laterality Date   colonscopy     ORIF HUMERUS FRACTURE Right 01/20/2019   Procedure: OPEN REDUCTION INTERNAL FIXATION (ORIF) PROXIMAL HUMERUS FRACTURE;  Surgeon: Justice Britain, MD;  Location: [facility];  Service: Orthopedics;  Laterality:  Right;  196mn   OVARIAN CYST REMOVAL  1973   Family History  Problem Relation Age of Onset   Leukemia Mother    Goiter Mother    Stroke Father    Cancer Father    Heart attack Father    Heart disease Brother    Parkinsonism Brother     Social History   Tobacco Use   Smoking status: Former    Packs/day: 0.25    Types: Cigarettes   Smokeless tobacco: Never   Tobacco comments:    Smoked cigarettes when she was younger   Substance Use Topics   Alcohol use: No   Marital Status: Single   ROS  Review of Systems  Constitutional: Positive for malaise/fatigue.  Cardiovascular:  Positive for chest pain. Negative for claudication, dyspnea on exertion, leg swelling, near-syncope, orthopnea, palpitations, paroxysmal nocturnal dyspnea and syncope.  Neurological:  Negative for dizziness.   Objective  There were no vitals taken for this visit.     07/05/2021    9:20 AM 07/03/2021    8:39 AM 06/26/2021    2:08 PM  Vitals with BMI  Height  5' 1.5" 5' 1.5"  Weight  93 lbs 13 oz 94 lbs 13 oz  BMI  176.16107.37 Systolic  [id number] Diastolic  65 52  Pulse 87 74 74      Physical Exam Vitals reviewed.  Neck:     Vascular: No carotid bruit.  Cardiovascular:     Rate and Rhythm: Normal rate and regular rhythm.     Pulses: Intact distal  pulses.          Carotid pulses are 2+ on the right side and 2+ on the left side.      Radial pulses are 2+ on the right side and 2+ on the left side.       Femoral pulses are 2+ on the right side and 2+ on the left side.      Popliteal pulses are 2+ on the right side and 2+ on the left side.       Dorsalis pedis pulses are 2+ on the right side and 2+ on the left side.       Posterior tibial pulses are 2+ on the right side and 2+ on the left side.     Heart sounds: S1 normal and S2 normal. No murmur heard.   No gallop.  Pulmonary:     Effort: Pulmonary effort is normal. No respiratory distress.     Breath sounds: No wheezing, rhonchi or rales.   Musculoskeletal:     Right lower leg: No edema.     Left lower leg: No edema.  Neurological:     General: No focal deficit present.    Laboratory examination:   CrCl cannot be calculated (Patient's most recent lab result is older than the maximum 21 days allowed.).     Latest Ref Rng & Units 06/26/2021    3:08 PM 10/29/2020    4:31 PM 05/24/2020    3:04 PM  CMP  Glucose 65 - 99 mg/dL 100   81   84    BUN 7 - 25 mg/dL '13   8   10    '$ Creatinine 0.60 - 1.00 mg/dL 0.91   0.77   0.71    Sodium 135 - 146 mmol/L 141   141   141    Potassium 3.5 - 5.3 mmol/L 4.1   4.3   4.1    Chloride 98 - 110 mmol/L 104   104   105    CO2 20 - 32 mmol/L '30   31   29    '$ Calcium 8.6 - 10.4 mg/dL 10.0   9.8   9.8    Total Protein 6.1 - 8.1 g/dL 6.7   6.4   6.4    Total Bilirubin 0.2 - 1.2 mg/dL 0.7   0.7   0.6    AST 10 - 35 U/L '26   23   24    '$ ALT 6 - 29 U/L '14   13   14        '$ Latest Ref Rng & Units 06/26/2021    3:08 PM 10/29/2020    4:31 PM 05/24/2020    3:04 PM  CBC  WBC 3.8 - 10.8 Thousand/uL 3.5   3.3   3.4    Hemoglobin 11.7 - 15.5 g/dL 12.9   12.9   13.0    Hematocrit 35.0 - 45.0 % 37.4   37.6   36.7    Platelets 140 - 400 Thousand/uL 170   174   168      Lipid Panel Recent Labs    10/29/20 1631 06/26/21 1508  CHOL 224* 224*  TRIG 77 132  LDLCALC 106* 104*  HDL 101 97  CHOLHDL 2.2 2.3     HEMOGLOBIN A1C Lab Results  Component Value Date   HGBA1C 5.2 06/26/2021   MPG 103 06/26/2021   TSH Recent Labs    10/29/20 1631 06/26/21 1508  TSH 1.05 0.98  External labs:  None  Allergies   Allergies  Allergen Reactions   Codeine     Emotional-Pt reports that she feels like crying   Gluten Meal    Milk-Related Compounds    Other     MSG and oats   Sulfa Antibiotics     GI problems   Iodine Rash    Patient states she had a rash with IV contrast    Medications Prior to Visit:   Outpatient Medications Prior to Visit  Medication Sig Dispense Refill   Ascorbic  Acid (VITAMIN C PO) Take 1 tablet by mouth daily.      azithromycin (ZITHROMAX) 250 MG tablet Take 2 tablets (500 mg) on  Day 1,  followed by 1 tablet (250 mg) once daily on Days 2 through 5. 6 each 1   B Complex Vitamins (VITAMIN B COMPLEX PO) Take by mouth.     Calcium Carbonate (CALCIUM 500 PO) Take 2,000 mg by mouth daily.     Cholecalciferol (VITAMIN D PO) Take 5,000 Units by mouth daily.      dexamethasone (DECADRON) 1 MG tablet Take 3 tabs for 3 days, 2 tabs for 3 days 1 tab for 5 days. Take with food. 20 tablet 0   Homeopathic Products [facility] ALLERGY EYE RELIEF OP) Apply 1 drop to eye at bedtime.     NON FORMULARY Take 1 tablet by mouth in the morning, at noon, and at bedtime. THYTROPHEN     Omega-3 Fatty Acids (OMEGA 3 PO) Take 500 mg by mouth 2 (two) times daily.     promethazine-dextromethorphan (PROMETHAZINE-DM) 6.25-15 MG/5ML syrup Take 5 mLs by mouth 4 (four) times daily as needed for cough. 118 mL 0   zinc gluconate 50 MG tablet Take 50 mg by mouth daily.     No facility-administered medications prior to visit.   Final Medications at End of Visit    No outpatient medications have been marked as taking for the 08/09/21 encounter (Appointment) with Rayetta Pigg, Upton Russey C, PA-C.   Radiology:   No results found.  Cardiac Studies:   None  EKG:   07/03/2021: Sinus rhythm at a rate of 68 beats per minute.  Normal axis.  Nonspecific T wave abnormality.  No evidence of ischemia or underlying injury pattern.  Assessment   No diagnosis found.    There are no discontinued medications.   No orders of the defined types were placed in this encounter.   Recommendations:   SHERILYNN DIEU is a 76 y.o. Caucasian female with recently diagnosed prediabetes, brief history of tobacco use in her 69s, and occasional GERD.  Denies history of hypertension, hyperlipidemia, family history of premature CAD.  Patient presents with concerns of intermittent neck pain as well as atypical  chest pain.  I reviewed external labs, although LDL is minimally elevated patient's HDL is 97 and she follows fairly strict heart healthy diet.  Encouraged her to continue to do this.  Suspect patient's fatigue is related to deconditioning as she has not been physically active over the last few weeks as typical.  Given that patient has relatively few cardiovascular risk factors and symptoms are quite atypical discussed options for further work-up including coronary calcium score and exercise stress test.  Discussed indications and benefits of each.  Patient opted to proceed first with exercise stress test and close monitoring of symptoms.  Counseled patient regarding signs and symptoms that would warrant urgent or emergent evaluation, she verbalized understanding agreement.  We will obtain  GXT.  Pressure is well controlled.  EKG is without acute ischemic changes.  Physical exam is essentially unremarkable.  Further recommendations pending results of cardiac testing.    Alethia Berthold, PA-C 08/09/2021, 9:32 AM Office: [phone]

## 2021-08-19 ENCOUNTER — Ambulatory Visit
Admission: RE | Admit: 2021-08-19 | Discharge: 2021-08-19 | Disposition: A | Discharge status: Home / Self Care | Payer: No Typology Code available for payment source | Source: Ambulatory Visit | Attending: Student | Admitting: Student

## 2021-08-19 ENCOUNTER — Ambulatory Visit: Payer: PPO | Admitting: Student

## 2021-08-19 DIAGNOSIS — R072 Precordial pain: Secondary | ICD-10-CM

## 2021-08-19 DIAGNOSIS — I700 Atherosclerosis of aorta: Secondary | ICD-10-CM | POA: Diagnosis not present

## 2021-08-22 ENCOUNTER — Ambulatory Visit: Payer: PPO | Admitting: Student

## 2021-08-27 ENCOUNTER — Encounter: Payer: Self-pay | Admitting: Student

## 2021-08-27 ENCOUNTER — Ambulatory Visit: Payer: PPO | Admitting: Student

## 2021-08-27 VITALS — BP 111/59 | HR 60 | Temp 98.0°F | Resp 16 | Ht 62.0 in | Wt 94.0 lb

## 2021-08-27 DIAGNOSIS — R072 Precordial pain: Secondary | ICD-10-CM

## 2021-08-27 NOTE — Progress Notes (Signed)
? ?Primary Physician/Referring:  [name], MD ? ?Patient ID: Tricia [name], female    DOB: 1945-11-04, 76 y.o.   MRN: [id number] ? ?Chief Complaint  ?Patient presents with  ? Follow-up  ? Chest Pain  ?  1 week  ? ?HPI:   ? ?Tricia [name]  is a 76 y.o. Caucasian female with recently diagnosed prediabetes, brief history of tobacco use in her 46s, and occasional GERD.  Denies history of hypertension, hyperlipidemia, family history of premature CAD. ? ?Patient was last seen in our office 07/03/2021 at which time she presented with complaints of intermittent neck pain and atypical chest pain.  Shared decision was to proceed with exercise treadmill stress test and coronary calcium score.  Exercise stress test was overall low risk and total coronary calcium score was 0.  Patient now presents for follow-up.  Patient has had no recurrence of chest pain since last office visit and is overall feeling well. ? ?Denies dyspnea, syncope, near syncope, orthopnea. ? ?Past Medical History:  ?Diagnosis Date  ? Proximal humerus fracture 01/20/2019  ? Tachycardia   ? Related to Epinephrine used for dental procedure  ? ?Past Surgical History:  ?Procedure Laterality Date  ? colonscopy    ? ORIF HUMERUS FRACTURE Right 01/20/2019  ? Procedure: OPEN REDUCTION INTERNAL FIXATION (ORIF) PROXIMAL HUMERUS FRACTURE;  Surgeon: Justice Britain, MD;  Location: [facility];  Service: Orthopedics;  Laterality: Right;  126mn  ? OVARIAN CYST REMOVAL  1973  ? ?Family History  ?Problem Relation Age of Onset  ? Leukemia Mother   ? Goiter Mother   ? Stroke Father   ? Cancer Father   ? Heart attack Father   ? Heart disease Brother   ? Parkinsonism Brother   ?  ?Social History  ? ?Tobacco Use  ? Smoking status: Former  ?  Packs/day: 0.25  ?  Types: Cigarettes  ?  Quit date: 159 ?  Years since quitting: 46.4  ? Smokeless tobacco: Never  ? Tobacco comments:  ?  Smoked cigarettes when she was younger   ?Substance Use Topics  ? Alcohol use: No  ? ?Marital  Status: Single  ? ?ROS  ?Review of Systems  ?Cardiovascular:  Negative for chest pain, claudication, dyspnea on exertion, leg swelling, near-syncope, orthopnea, palpitations, paroxysmal nocturnal dyspnea and syncope.  ?Neurological:  Negative for dizziness.  ? ?Objective  ?Blood pressure (!) 111/59, pulse 60, temperature 98 ?F (36.7 ?C), temperature source Temporal, resp. rate 16, height '5\' 2"'$  (1.575 m), weight 94 lb (42.6 kg), SpO2 99 %.  ? ?  08/27/2021  ?  9:29 AM 08/27/2021  ?  9:21 AM 07/05/2021  ?  9:20 AM  ?Vitals with BMI  ?Height  '5\' 2"'$    ?Weight  94 lbs   ?BMI  17.19   ?Systolic [id number]  ?Diastolic 59 57   ?Pulse 60 94 87  ?  ? ? Physical Exam ?Vitals reviewed.  ?Neck:  ?   Vascular: No carotid bruit.  ?Cardiovascular:  ?   Rate and Rhythm: Normal rate and regular rhythm.  ?   Pulses: Intact distal pulses.     ?     Carotid pulses are 2+ on the right side and 2+ on the left side. ?     Radial pulses are 2+ on the right side and 2+ on the left side.  ?     Femoral pulses are 2+ on the right side and 2+ on the left side. ?  Popliteal pulses are 2+ on the right side and 2+ on the left side.  ?     Dorsalis pedis pulses are 2+ on the right side and 2+ on the left side.  ?     Posterior tibial pulses are 2+ on the right side and 2+ on the left side.  ?   Heart sounds: S1 normal and S2 normal. No murmur heard. ?  No gallop.  ?Pulmonary:  ?   Effort: Pulmonary effort is normal. No respiratory distress.  ?   Breath sounds: No wheezing, rhonchi or rales.  ?Musculoskeletal:  ?   Right lower leg: No edema.  ?   Left lower leg: No edema.  ?Neurological:  ?   General: No focal deficit present.  ?Physical exam unchanged compared to previous office visit. ? ?Laboratory examination:  ? ?CrCl cannot be calculated (Patient's most recent lab result is older than the maximum 21 days allowed.).  ? ?  Latest Ref Rng & Units 06/26/2021  ?  3:08 PM 10/29/2020  ?  4:31 PM 05/24/2020  ?  3:04 PM  ?CMP  ?Glucose 65 - 99 mg/dL 100   81    84    ?BUN 7 - 25 mg/dL '13   8   10    '$ ?Creatinine 0.60 - 1.00 mg/dL 0.91   0.77   0.71    ?Sodium 135 - 146 mmol/L 141   141   141    ?Potassium 3.5 - 5.3 mmol/L 4.1   4.3   4.1    ?Chloride 98 - 110 mmol/L 104   104   105    ?CO2 20 - 32 mmol/L '30   31   29    '$ ?Calcium 8.6 - 10.4 mg/dL 10.0   9.8   9.8    ?Total Protein 6.1 - 8.1 g/dL 6.7   6.4   6.4    ?Total Bilirubin 0.2 - 1.2 mg/dL 0.7   0.7   0.6    ?AST 10 - 35 U/L '26   23   24    '$ ?ALT 6 - 29 U/L '14   13   14    '$ ? ? ?  Latest Ref Rng & Units 06/26/2021  ?  3:08 PM 10/29/2020  ?  4:31 PM 05/24/2020  ?  3:04 PM  ?CBC  ?WBC 3.8 - 10.8 Thousand/uL 3.5   3.3   3.4    ?Hemoglobin 11.7 - 15.5 g/dL 12.9   12.9   13.0    ?Hematocrit 35.0 - 45.0 % 37.4   37.6   36.7    ?Platelets 140 - 400 Thousand/uL 170   174   168    ? ? ?Lipid Panel ?Recent Labs  ?  10/29/20 ?1631 06/26/21 ?1508  ?CHOL 224* 224*  ?TRIG 77 132  ?LDLCALC 106* 104*  ?HDL 101 97  ?CHOLHDL 2.2 2.3  ? ? ?HEMOGLOBIN A1C ?Lab Results  ?Component Value Date  ? HGBA1C 5.2 06/26/2021  ? MPG 103 06/26/2021  ? ?TSH ?Recent Labs  ?  10/29/20 ?1631 06/26/21 ?1508  ?TSH 1.05 0.98  ? ?External labs:  ?None ? ?Allergies  ? ?Allergies  ?Allergen Reactions  ? Codeine   ?  Emotional-Pt reports that she feels like crying  ? Gluten Meal   ? Milk-Related Compounds   ? Other   ?  MSG and oats  ? Sulfa Antibiotics   ?  GI problems  ? Iodine Rash  ?  Patient  states she had a rash with IV contrast  ?  ?Medications Prior to Visit:  ? ?Outpatient Medications Prior to Visit  ?Medication Sig Dispense Refill  ? Ascorbic Acid (VITAMIN C PO) Take 1 tablet by mouth daily.     ? B Complex Vitamins (VITAMIN B COMPLEX PO) Take by mouth.    ? Calcium Carbonate (CALCIUM 500 PO) Take 2,000 mg by mouth daily.    ? Cholecalciferol (VITAMIN D PO) Take 5,000 Units by mouth daily.     ? Homeopathic Products [facility] ALLERGY EYE RELIEF OP) Apply 1 drop to eye at bedtime.    ? Omega-3 Fatty Acids (OMEGA 3 PO) Take 500 mg by mouth 2 (two) times  daily.    ? zinc gluconate 50 MG tablet Take 50 mg by mouth daily.    ? azithromycin (ZITHROMAX) 250 MG tablet Take 2 tablets (500 mg) on  Day 1,  followed by 1 tablet (250 mg) once daily on Days 2 through 5. 6 each 1  ? dexamethasone (DECADRON) 1 MG tablet Take 3 tabs for 3 days, 2 tabs for 3 days 1 tab for 5 days. Take with food. 20 tablet 0  ? NON FORMULARY Take 1 tablet by mouth in the morning, at noon, and at bedtime. THYTROPHEN    ? promethazine-dextromethorphan (PROMETHAZINE-DM) 6.25-15 MG/5ML syrup Take 5 mLs by mouth 4 (four) times daily as needed for cough. 118 mL 0  ? ?No facility-administered medications prior to visit.  ? ?Final Medications at End of Visit   ? ?Current Meds  ?Medication Sig  ? Ascorbic Acid (VITAMIN C PO) Take 1 tablet by mouth daily.   ? B Complex Vitamins (VITAMIN B COMPLEX PO) Take by mouth.  ? Calcium Carbonate (CALCIUM 500 PO) Take 2,000 mg by mouth daily.  ? Cholecalciferol (VITAMIN D PO) Take 5,000 Units by mouth daily.   ? Homeopathic Products [facility] ALLERGY EYE RELIEF OP) Apply 1 drop to eye at bedtime.  ? Omega-3 Fatty Acids (OMEGA 3 PO) Take 500 mg by mouth 2 (two) times daily.  ? zinc gluconate 50 MG tablet Take 50 mg by mouth daily.  ? ?Radiology:  ? ?No results found. ? ?Cardiac Studies:  ? ?Exercise treadmill stress test 08/09/2021: ?Exercise treadmill stress test performed using Bruce protocol.  Patient reached 7.0 METS, and 94% of age predicted maximum heart rate.  Exercise capacity was fair.  No chest pain reported.  Normal heart rate and hemodynamic response. Stress EKG revealed no ischemic changes. ?Low risk study. ? ?CORONARY CALCIUM SCORES 08/19/2021: ? Left Main: 0 ? LAD: 0 ? LCx: 0 ? RCA: 0 ? Total Agatston Score: 0 ? MESA database percentile: 0 ?  ?AORTA MEASUREMENTS: ? Ascending Aorta: 36 mm ? Descending Aorta: 24 mm ?  ?OTHER FINDINGS: ? Scattered aortic calcifications. Heart is normal size. Aorta normal caliber. No adenopathy. No confluent opacities or  effusions. 5 mm nodule in the left lower lobe on image 61. No acute findings in the upper abdomen. Chest wall soft tissues are unremarkable. No acute bony abnormality. ?  ?IMPRESSION: ?No visible coronary art

## 2021-09-03 ENCOUNTER — Ambulatory Visit: Payer: PPO | Admitting: Adult Health

## 2021-09-26 DIAGNOSIS — M79645 Pain in left finger(s): Secondary | ICD-10-CM | POA: Diagnosis not present

## 2021-10-02 ENCOUNTER — Ambulatory Visit (INDEPENDENT_AMBULATORY_CARE_PROVIDER_SITE_OTHER): Payer: PPO | Admitting: Adult Health

## 2021-10-02 ENCOUNTER — Encounter: Payer: Self-pay | Admitting: Adult Health

## 2021-10-02 VITALS — BP 94/60 | HR 70 | Temp 97.6°F | Wt 93.6 lb

## 2021-10-02 DIAGNOSIS — J069 Acute upper respiratory infection, unspecified: Secondary | ICD-10-CM

## 2021-10-02 LAB — POC COVID19 BINAXNOW: SARS Coronavirus 2 Ag: NEGATIVE

## 2021-10-02 LAB — POCT INFLUENZA A/B
Influenza A, POC: NEGATIVE
Influenza B, POC: NEGATIVE

## 2021-10-02 NOTE — Patient Instructions (Signed)

## 2021-10-02 NOTE — Progress Notes (Signed)
Assessment and Plan:  Tricia [name] was seen today for acute visit.  Diagnoses and all orders for this visit:  Viral URI with cough Benign exam, negative influenza/covid, she is reassured by this Discussed the importance of avoiding unnecessary antibiotic therapy. Discussed typical viral course, worst day 3-4, lasts about 7 days Suggested symptomatic OTC remedies. Nasal saline spray for congestion. Hydration, immune support Follow up as needed if not improving as expected or any new sx Declined any prescription supportive meds today  -     POCT Influenza A/B -     POC COVID-19  Over 20 minutes of exam, counseling, chart review, and critical decision making was performed.   Future Appointments  Date Time Provider [place]  12/06/2021  3:00 PM [facility] DX DEXA 1 [facility]  06/30/2022  2:00 PM Alycia Rossetti, NP [facility] None    ------------------------------------------------------------------------------------------------------------------   HPI BP 94/60   Pulse 70   Temp 97.6 F (36.4 C)   Wt 93 lb 9.6 oz (42.5 kg)   SpO2 99%   BMI 17.12 kg/m  76 y.o.female presents for evaluation of URI with body aches. She tested negative for flu A/B and covid 19 while here today.   She reports 2-3 days of gradual onset  Day 1 - fatigued, achy  Progressed to feeling vaguely weak and tired, sense of chills but no fever Feels worse today, aching in her muscles She also reports slight intermittent sore throat and intermittent cough from her throat Denies notable congestion Denies dyspnea, wheezing, chest/back aching Denies GI sx Denies urinary sx Denies rash, tick bite  Does have hx of allergies, typically earlier in the Spring.  Denies watery/itchy eyes, sneezing.   Last had covid 19 07/05/2021   Past Medical History:  Diagnosis Date   Proximal humerus fracture 01/20/2019   Tachycardia    Related to Epinephrine used for dental procedure     Allergies   Allergen Reactions   Codeine     Emotional-Pt reports that she feels like crying   Gluten Meal    Milk-Related Compounds    Other     MSG and oats   Sulfa Antibiotics     GI problems   Iodine Rash    Patient states she had a rash with IV contrast    Current Outpatient Medications on File Prior to Visit  Medication Sig   Ascorbic Acid (VITAMIN C PO) Take 1 tablet by mouth daily.    B Complex Vitamins (VITAMIN B COMPLEX PO) Take by mouth.   Calcium Carbonate (CALCIUM 500 PO) Take 2,000 mg by mouth daily.   Cholecalciferol (VITAMIN D PO) Take 5,000 Units by mouth daily.    Homeopathic Products [facility] ALLERGY EYE RELIEF OP) Apply 1 drop to eye at bedtime.   Omega-3 Fatty Acids (OMEGA 3 PO) Take 500 mg by mouth 2 (two) times daily.   zinc gluconate 50 MG tablet Take 50 mg by mouth daily.   No current facility-administered medications on file prior to visit.    ROS: all negative except above.   Physical Exam:  BP 94/60   Pulse 70   Temp 97.6 F (36.4 C)   Wt 93 lb 9.6 oz (42.5 kg)   SpO2 99%   BMI 17.12 kg/m   General Appearance: Well nourished, thin elder female in no apparent distress. Eyes: PERRLA, conjunctiva no swelling or erythema Sinuses: No Frontal/maxillary tenderness ENT/Mouth: Ext aud canals clear, TMs without erythema, bulging. No erythema, swelling, or exudate on  post pharynx.  Tonsils not swollen or erythematous. Hearing normal.  Neck: Supple, thyroid normal.  Respiratory: Respiratory effort normal, BS equal bilaterally without rales, rhonchi, wheezing or stridor.  Cardio: RRR with no MRGs. Brisk peripheral pulses without edema.  Abdomen: Soft, + BS.  Non tender, no guarding, rebound, hernias, masses. Lymphatics: Non tender without lymphadenopathy.  Musculoskeletal: no deformity, effusion, normal gait.  Skin: Warm, dry without rashes, lesions, ecchymosis.  Neuro: Normal muscle tone Psych: Awake and oriented X 3, normal affect, Insight and Judgment  appropriate.    Izora Ribas, NP 5:08 PM [facility]

## 2021-10-04 ENCOUNTER — Ambulatory Visit (INDEPENDENT_AMBULATORY_CARE_PROVIDER_SITE_OTHER): Payer: PPO | Admitting: Adult Health

## 2021-10-04 ENCOUNTER — Encounter: Payer: Self-pay | Admitting: Adult Health

## 2021-10-04 VITALS — BP 109/64 | HR 56 | Temp 96.8°F | Wt 95.2 lb

## 2021-10-04 DIAGNOSIS — D235 Other benign neoplasm of skin of trunk: Secondary | ICD-10-CM

## 2021-10-04 DIAGNOSIS — D229 Melanocytic nevi, unspecified: Secondary | ICD-10-CM

## 2021-10-10 ENCOUNTER — Ambulatory Visit: Payer: PPO | Admitting: Student

## 2021-12-06 ENCOUNTER — Other Ambulatory Visit: Payer: PPO

## 2022-01-17 DIAGNOSIS — H16141 Punctate keratitis, right eye: Secondary | ICD-10-CM | POA: Diagnosis not present

## 2022-06-10 ENCOUNTER — Encounter: Payer: Medicare Other | Admitting: Internal Medicine

## 2022-06-26 ENCOUNTER — Inpatient Hospital Stay: Admission: RE | Admit: 2022-06-26 | Payer: PPO | Source: Ambulatory Visit

## 2022-06-26 NOTE — Progress Notes (Deleted)
Complete Physical Exam Assessment:   Diagnoses and all orders for this visit:  Encounter for general adult medical examination with abnormal findings Due annually  Mammogram scheduled for 07/02/21  Labile hypertension Doing well Monitor blood pressure in office Monitors diet, decreased sodium Will continue to monitor  Hyperlipidemia, mixed Taking Omega-3, 2,'000mg'$  daily Discussed dietary and exercise modifications Mild elevations managed by lifestyle Check lipid panel   Age related osteoporosis, unspecified pathological fracture presence Taking Calcium and Vitamin D Supplementation, discussed vitamin K Participates in strengthening and stretching exercises at home - resume once knee allows Dexa follow up due scheduled 12/06/21  Vitamin D deficiency Taking 5000 IU; continue supplement for goal 60-100 Continue supplementation, check levels    BMI <19, adult/ Mild protein calorie deficiency (HCC) High fat/protein diet, adequate intake discussed. Check serum protein. Discussed age appropriate cancer screening. Discussed possible benefit from remeron but declines at this time. Dietary supplements reviewed -   Abnormal Glucose Continue diet and exercise - A1c   Medication Management - Magnesium  Screening for ischemic heart disease - EKG  Screening for hematuria or proteinuria - Routine UA with reflex microscopic - Microalbumin/ Creatinine urine ratio  Memory change Given memory compensation strategies Encourage brain exercises daily Monitor     I provided 30 minutes of non-face-to-face time during this encounter including counseling, chart review, and critical decision making was preformed.   Future Appointments  Date Time Provider [place]  06/30/2022  2:00 PM Alycia Rossetti, NP [facility] None         Subjective:  Tricia [name] is a 77 y.o. female who presents for Medicare Annual Wellness Visit and 3 month follow up for labile HTN,  hyperlipidemia, osteoporosis, and vitamin D Def.   She got remarried 11/30/20. He lives in [place] and currently they are splitting time between [place] and [place].  Had fall in May 2021, fractures L patella, bracing only, using support and crutches as needed, Dr. Stann Mainland is following.  She has recovered quite well and denies pain.   She has noticed frequency of urination x 2 weeks and is concerned she has a UTI.  Denies hematuria and dysuria.   She had DEXA in 02/2018 which showed R fem T -4.0, was recommended fosamax but ultimately declined. She continues to take Vit D and Calcium. Continues to perform weight bearing exercises  Her memory has worsened, more difficulty with short term memory. Difficulty remembering names.   She does have TMJ. Follows with dentist. Doing behavioral changes and it has improved.   BMI is There is no height or weight on file to calculate BMI., she has been working on diet and exercise, doing "classical stretch" series, trying to gain weight back up to 105 lb. She can go a whole day without eating  Wt Readings from Last 3 Encounters:  10/04/21 95 lb 3.2 oz (43.2 kg)  10/02/21 93 lb 9.6 oz (42.5 kg)  08/27/21 94 lb (42.6 kg)   Her blood pressure has been controlled at home, today their BP is    BP Readings from Last 3 Encounters:  10/04/21 109/64  10/02/21 94/60  08/27/21 (!) 111/59  She does workout. She denies chest pain, shortness of breath, dizziness.   She is not on cholesterol medication and denies myalgias. Her cholesterol is at goal. She is a vegetarian. The cholesterol last visit was:   Lab Results  Component Value Date   CHOL 224 (H) 06/26/2021   HDL 97 06/26/2021   LDLCALC  104 (H) 06/26/2021   TRIG 132 06/26/2021   CHOLHDL 2.3 06/26/2021   Last GFR Lab Results  Component Value Date   GFRNONAA 84 05/24/2020   Patient is on Vitamin D supplement.  Taking 5000 IU daily, no recent dose changes Lab Results  Component Value Date   VD25OH 98  06/26/2021       Medication Review:       Current Outpatient Medications (Other):    Ascorbic Acid (VITAMIN C PO), Take 1 tablet by mouth daily.    B Complex Vitamins (VITAMIN B COMPLEX PO), Take by mouth.   Calcium Carbonate (CALCIUM 500 PO), Take 2,000 mg by mouth daily.   Cholecalciferol (VITAMIN D PO), Take 5,000 Units by mouth daily.    Homeopathic Products [facility] ALLERGY EYE RELIEF OP), Apply 1 drop to eye at bedtime.   Omega-3 Fatty Acids (OMEGA 3 PO), Take 500 mg by mouth 2 (two) times daily.   zinc gluconate 50 MG tablet, Take 50 mg by mouth daily.  Allergies: Allergies  Allergen Reactions   Codeine     Emotional-Pt reports that she feels like crying   Gluten Meal    Milk-Related Compounds    Other     MSG and oats   Sulfa Antibiotics     GI problems   Iodine Rash    Patient states she had a rash with IV contrast    Current Problems (verified) has Vitamin D deficiency; Osteoporosis; FHx: heart disease; Hyperlipidemia, mixed; Labile hypertension; BMI less than 19,adult; and Mild protein-calorie malnutrition (HCC) on their problem list.  Screening Tests  There is no immunization history on file for this patient.  Preventative care: Last colonoscopy: remote Cologuard: 03/10/2018,   Mammogram 2018 , scheduled mammogram today DEXA: 02/2018, T-4.0 R fem, 2017 T-4.1, declined fosamax - ordered to get with mmg at breast center   Prior vaccinations: DECLINES ALL VACCINATIONS  Names of Other Physician/Practitioners you currently use: 1. [facility] here for primary care 2. Dr. [place], 05/2021, monitoring mild cataracts, wears glasses 3. Dr Pablo Lawrence, has appointment 06/2021, goes q79m Patient Care Team: MUnk Pinto MD as PCP - General (Internal Medicine) RPaula Compton MD as Consulting Physician (Obstetrics and Gynecology) MRichmond Campbell MD as Consulting Physician (Gastroenterology)  Surgical: She  has a past  surgical history that includes Ovarian cyst removal (1973); colonscopy; and ORIF humerus fracture (Right, 01/20/2019). Family Her family history includes Cancer in her father; Goiter in her mother; Heart attack in her father; Heart disease in her brother; Leukemia in her mother; Parkinsonism in her brother; Stroke in her father. Social history  She reports that she quit smoking about 47 years ago. Her smoking use included cigarettes. She smoked an average of .25 packs per day. She has never used smokeless tobacco. She reports that she does not drink alcohol and does not use drugs.   Review of Systems  Constitutional:  Negative for chills and fever.  HENT:  Negative for congestion, hearing loss, sinus pain, sore throat and tinnitus.   Eyes:  Negative for blurred vision and double vision.  Respiratory:  Negative for cough, hemoptysis, sputum production, shortness of breath and wheezing.   Cardiovascular:  Negative for chest pain, palpitations and leg swelling.  Gastrointestinal:  Negative for abdominal pain, constipation, diarrhea, heartburn, nausea and vomiting.  Genitourinary:  Positive for frequency. Negative for dysuria and urgency.  Musculoskeletal:  Negative for back pain, falls, joint pain, myalgias and neck pain.  Skin:  Negative for rash.  Neurological:  Negative for dizziness, tingling, tremors, weakness and headaches.  Endo/Heme/Allergies:  Does not bruise/bleed easily.  Psychiatric/Behavioral:  Positive for memory loss. Negative for depression and suicidal ideas. The patient is not nervous/anxious and does not have insomnia.      Objective:   There were no vitals filed for this visit.    There is no height or weight on file to calculate BMI.  General appearance: pleasant thin female in no apparent distress HEENT: normocephalic, sclerae anicteric, TMs pearly, nares patent, no discharge or erythema, pharynx normal Oral cavity: MMM, no lesions Neck: supple, no lymphadenopathy,  no thyromegaly, no masses Heart: RRR, normal S1, S2, no murmurs Lungs: CTA bilaterally, no wheezes, rhonchi, or rales Abdomen: +bs, soft, vague generalized tenderness, non distended, no masses, no hepatomegaly, no splenomegaly Musculoskeletal: Normal strength and range of motion. No CVA tenderness Extremities: no edema, no cyanosis, no clubbing Pulses: 2+ symmetric, upper and lower extremities, normal cap refill Neurological: alert, oriented x 3, CN2-12 intact, strength normal upper extremities and lower extremities, sensation normal throughout, DTRs 2+ throughout, no cerebellar signs, gait is steady.  Psychiatric: normal affect, behavior normal, pleasant . MMSE 29/30 Breast: deferred Pelvic: deferred EKG: NSR, no ST changes  Theodosia Bahena Mikki Santee, NP 12:35 PM [facility]

## 2022-06-30 ENCOUNTER — Encounter: Payer: Medicare Other | Admitting: Nurse Practitioner

## 2022-08-15 NOTE — Progress Notes (Deleted)
Assessment and Plan:  There are no diagnoses linked to this encounter.    Further disposition pending results of labs. Discussed med's effects and SE's.   Over 30 minutes of exam, counseling, chart review, and critical decision making was performed.   Future Appointments  Date Time Provider Department Center  08/18/2022 11:30 AM [name], NP [facility] None    ------------------------------------------------------------------------------------------------------------------   HPI There were no vitals taken for this visit. 77 y.o.female presents for  Past Medical History:  Diagnosis Date   Proximal humerus fracture 01/20/2019   Tachycardia    Related to Epinephrine used for dental procedure     Allergies  Allergen Reactions   Codeine     Emotional-Pt reports that she feels like crying   Gluten Meal    Milk-Related Compounds    Other     MSG and oats   Sulfa Antibiotics     GI problems   Iodine Rash    Patient states she had a rash with IV contrast    Current Outpatient Medications on File Prior to Visit  Medication Sig   Ascorbic Acid (VITAMIN C PO) Take 1 tablet by mouth daily.    B Complex Vitamins (VITAMIN B COMPLEX PO) Take by mouth.   Calcium Carbonate (CALCIUM 500 PO) Take 2,000 mg by mouth daily.   Cholecalciferol (VITAMIN D PO) Take 5,000 Units by mouth daily.    Homeopathic Products [facility] ALLERGY EYE RELIEF OP) Apply 1 drop to eye at bedtime.   Omega-3 Fatty Acids (OMEGA 3 PO) Take 500 mg by mouth 2 (two) times daily.   zinc gluconate 50 MG tablet Take 50 mg by mouth daily.   No current facility-administered medications on file prior to visit.    ROS: all negative except above.   Physical Exam:  There were no vitals taken for this visit.  General Appearance: Well nourished, in no apparent distress. Eyes: PERRLA, EOMs, conjunctiva no swelling or erythema Sinuses: No Frontal/maxillary tenderness ENT/Mouth: Ext aud canals clear, TMs  without erythema, bulging. No erythema, swelling, or exudate on post pharynx.  Tonsils not swollen or erythematous. Hearing normal.  Neck: Supple, thyroid normal.  Respiratory: Respiratory effort normal, BS equal bilaterally without rales, rhonchi, wheezing or stridor.  Cardio: RRR with no MRGs. Brisk peripheral pulses without edema.  Abdomen: Soft, + BS.  Non tender, no guarding, rebound, hernias, masses. Lymphatics: Non tender without lymphadenopathy.  Musculoskeletal: Full ROM, 5/5 strength, normal gait.  Skin: Warm, dry without rashes, lesions, ecchymosis.  Neuro: Cranial nerves intact. Normal muscle tone, no cerebellar symptoms. Sensation intact.  Psych: Awake and oriented X 3, normal affect, Insight and Judgment appropriate.     [name], NP 8:41 AM [facility]

## 2022-08-18 ENCOUNTER — Ambulatory Visit: Payer: Medicare Other | Admitting: Nurse Practitioner

## 2022-08-25 NOTE — Progress Notes (Unsigned)
Assessment and Plan:  There are no diagnoses linked to this encounter.    Further disposition pending results of labs. Discussed med's effects and SE's.   Over 30 minutes of exam, counseling, chart review, and critical decision making was performed.   Future Appointments  Date Time Provider Department Center  08/26/2022 11:00 AM Tricia Dick, NP [facility] None    ------------------------------------------------------------------------------------------------------------------   HPI There were no vitals taken for this visit. 77 y.o.female presents for  Past Medical History:  Diagnosis Date   Proximal humerus fracture 01/20/2019   Tachycardia    Related to Epinephrine used for dental procedure     Allergies  Allergen Reactions   Codeine     Emotional-Pt reports that she feels like crying   Gluten Meal    Milk-Related Compounds    Other     MSG and oats   Sulfa Antibiotics     GI problems   Iodine Rash    Patient states she had a rash with IV contrast    Current Outpatient Medications on File Prior to Visit  Medication Sig   Ascorbic Acid (VITAMIN C PO) Take 1 tablet by mouth daily.    B Complex Vitamins (VITAMIN B COMPLEX PO) Take by mouth.   Calcium Carbonate (CALCIUM 500 PO) Take 2,000 mg by mouth daily.   Cholecalciferol (VITAMIN D PO) Take 5,000 Units by mouth daily.    Homeopathic Products [facility] ALLERGY EYE RELIEF OP) Apply 1 drop to eye at bedtime.   Omega-3 Fatty Acids (OMEGA 3 PO) Take 500 mg by mouth 2 (two) times daily.   zinc gluconate 50 MG tablet Take 50 mg by mouth daily.   No current facility-administered medications on file prior to visit.    ROS: all negative except above.   Physical Exam:  There were no vitals taken for this visit.  General Appearance: Well nourished, in no apparent distress. Eyes: PERRLA, EOMs, conjunctiva no swelling or erythema Sinuses: No Frontal/maxillary tenderness ENT/Mouth: Ext aud canals clear, TMs  without erythema, bulging. No erythema, swelling, or exudate on post pharynx.  Tonsils not swollen or erythematous. Hearing normal.  Neck: Supple, thyroid normal.  Respiratory: Respiratory effort normal, BS equal bilaterally without rales, rhonchi, wheezing or stridor.  Cardio: RRR with no MRGs. Brisk peripheral pulses without edema.  Abdomen: Soft, + BS.  Non tender, no guarding, rebound, hernias, masses. Lymphatics: Non tender without lymphadenopathy.  Musculoskeletal: Full ROM, 5/5 strength, normal gait.  Skin: Warm, dry without rashes, lesions, ecchymosis.  Neuro: Cranial nerves intact. Normal muscle tone, no cerebellar symptoms. Sensation intact.  Psych: Awake and oriented X 3, normal affect, Insight and Judgment appropriate.     Tricia Dick, NP 12:09 PM [facility]

## 2022-08-26 ENCOUNTER — Encounter: Payer: Self-pay | Admitting: Nurse Practitioner

## 2022-08-26 ENCOUNTER — Ambulatory Visit (INDEPENDENT_AMBULATORY_CARE_PROVIDER_SITE_OTHER): Payer: Medicare Other | Admitting: Nurse Practitioner

## 2022-08-26 VITALS — BP 110/60 | HR 74 | Temp 97.8°F | Resp 16 | Ht 62.0 in | Wt 92.8 lb

## 2022-08-26 DIAGNOSIS — K769 Liver disease, unspecified: Secondary | ICD-10-CM

## 2022-08-26 DIAGNOSIS — R0989 Other specified symptoms and signs involving the circulatory and respiratory systems: Secondary | ICD-10-CM | POA: Diagnosis not present

## 2022-08-26 DIAGNOSIS — R002 Palpitations: Secondary | ICD-10-CM | POA: Diagnosis not present

## 2022-08-26 DIAGNOSIS — J9859 Other diseases of mediastinum, not elsewhere classified: Secondary | ICD-10-CM

## 2022-08-26 DIAGNOSIS — N9489 Other specified conditions associated with female genital organs and menstrual cycle: Secondary | ICD-10-CM

## 2022-08-26 NOTE — Patient Instructions (Signed)
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

## 2023-12-14 ENCOUNTER — Other Ambulatory Visit: Payer: Self-pay

## 2023-12-14 ENCOUNTER — Emergency Department (HOSPITAL_COMMUNITY)

## 2023-12-14 ENCOUNTER — Inpatient Hospital Stay (HOSPITAL_BASED_OUTPATIENT_CLINIC_OR_DEPARTMENT_OTHER): Admission: EM | Admit: 2023-12-14 | Discharge: 2023-12-22 | DRG: 522 | Disposition: A | Discharge status: Home Health

## 2023-12-14 ENCOUNTER — Emergency Department (HOSPITAL_BASED_OUTPATIENT_CLINIC_OR_DEPARTMENT_OTHER): Admitting: Radiology

## 2023-12-14 DIAGNOSIS — E785 Hyperlipidemia, unspecified: Secondary | ICD-10-CM | POA: Diagnosis present

## 2023-12-14 DIAGNOSIS — Z806 Family history of leukemia: Secondary | ICD-10-CM

## 2023-12-14 DIAGNOSIS — Z87891 Personal history of nicotine dependence: Secondary | ICD-10-CM

## 2023-12-14 DIAGNOSIS — Z885 Allergy status to narcotic agent status: Secondary | ICD-10-CM

## 2023-12-14 DIAGNOSIS — Z79899 Other long term (current) drug therapy: Secondary | ICD-10-CM

## 2023-12-14 DIAGNOSIS — Z91041 Radiographic dye allergy status: Secondary | ICD-10-CM | POA: Diagnosis not present

## 2023-12-14 DIAGNOSIS — Z809 Family history of malignant neoplasm, unspecified: Secondary | ICD-10-CM

## 2023-12-14 DIAGNOSIS — Z7982 Long term (current) use of aspirin: Secondary | ICD-10-CM | POA: Diagnosis not present

## 2023-12-14 DIAGNOSIS — Z8249 Family history of ischemic heart disease and other diseases of the circulatory system: Secondary | ICD-10-CM

## 2023-12-14 DIAGNOSIS — Z9102 Food additives allergy status: Secondary | ICD-10-CM | POA: Diagnosis not present

## 2023-12-14 DIAGNOSIS — Z91018 Allergy to other foods: Secondary | ICD-10-CM | POA: Diagnosis not present

## 2023-12-14 DIAGNOSIS — Z888 Allergy status to other drugs, medicaments and biological substances status: Secondary | ICD-10-CM | POA: Diagnosis not present

## 2023-12-14 DIAGNOSIS — Z823 Family history of stroke: Secondary | ICD-10-CM

## 2023-12-14 DIAGNOSIS — S7291XA Unspecified fracture of right femur, initial encounter for closed fracture: Secondary | ICD-10-CM | POA: Diagnosis present

## 2023-12-14 DIAGNOSIS — S72001A Fracture of unspecified part of neck of right femur, initial encounter for closed fracture: Principal | ICD-10-CM | POA: Diagnosis present

## 2023-12-14 DIAGNOSIS — Z91011 Allergy to milk products: Secondary | ICD-10-CM

## 2023-12-14 DIAGNOSIS — Z8349 Family history of other endocrine, nutritional and metabolic diseases: Secondary | ICD-10-CM

## 2023-12-14 DIAGNOSIS — W010XXA Fall on same level from slipping, tripping and stumbling without subsequent striking against object, initial encounter: Secondary | ICD-10-CM | POA: Diagnosis present

## 2023-12-14 DIAGNOSIS — I10 Essential (primary) hypertension: Secondary | ICD-10-CM | POA: Diagnosis present

## 2023-12-14 DIAGNOSIS — M810 Age-related osteoporosis without current pathological fracture: Secondary | ICD-10-CM | POA: Diagnosis present

## 2023-12-14 DIAGNOSIS — Z820 Family history of epilepsy and other diseases of the nervous system: Secondary | ICD-10-CM

## 2023-12-14 LAB — CBC WITH DIFFERENTIAL/PLATELET
Abs Immature Granulocytes: 0.01 K/uL (ref 0.00–0.07)
Basophils Absolute: 0 K/uL (ref 0.0–0.1)
Basophils Relative: 1 %
Eosinophils Absolute: 0.1 K/uL (ref 0.0–0.5)
Eosinophils Relative: 4 %
HCT: 31.8 % — ABNORMAL LOW (ref 36.0–46.0)
Hemoglobin: 11.2 g/dL — ABNORMAL LOW (ref 12.0–15.0)
Immature Granulocytes: 0 %
Lymphocytes Relative: 15 %
Lymphs Abs: 0.5 K/uL — ABNORMAL LOW (ref 0.7–4.0)
MCH: 33.2 pg (ref 26.0–34.0)
MCHC: 35.2 g/dL (ref 30.0–36.0)
MCV: 94.4 fL (ref 80.0–100.0)
Monocytes Absolute: 0.4 K/uL (ref 0.1–1.0)
Monocytes Relative: 11 %
Neutro Abs: 2.3 K/uL (ref 1.7–7.7)
Neutrophils Relative %: 69 %
Platelets: 117 K/uL — ABNORMAL LOW (ref 150–400)
RBC: 3.37 MIL/uL — ABNORMAL LOW (ref 3.87–5.11)
RDW: 11.9 % (ref 11.5–15.5)
WBC: 3.3 K/uL — ABNORMAL LOW (ref 4.0–10.5)
nRBC: 0 % (ref 0.0–0.2)

## 2023-12-14 LAB — BASIC METABOLIC PANEL WITH GFR
Anion gap: 12 (ref 5–15)
BUN: 13 mg/dL (ref 8–23)
CO2: 22 mmol/L (ref 22–32)
Calcium: 9.4 mg/dL (ref 8.9–10.3)
Chloride: 108 mmol/L (ref 98–111)
Creatinine, Ser: 0.76 mg/dL (ref 0.44–1.00)
GFR, Estimated: >60 mL/min (ref >60)
Glucose, Bld: 77 mg/dL (ref 70–99)
Potassium: 3.7 mmol/L (ref 3.5–5.1)
Sodium: 142 mmol/L (ref 135–145)

## 2023-12-14 LAB — PROTIME-INR
INR: 0.9 (ref 0.8–1.2)
Prothrombin Time: 13.1 s (ref 11.4–15.2)

## 2023-12-14 MED ORDER — CHLORHEXIDINE GLUCONATE 4 % EX SOLN
60.0000 mL | Freq: Once | CUTANEOUS | Status: AC
  Administered 2023-12-15: 4 via TOPICAL
  Filled 2023-12-14: qty 60

## 2023-12-14 MED ORDER — AMISULPRIDE (ANTIEMETIC) 5 MG/2ML IV SOLN: 10.0000 mg | Freq: Once | INTRAVENOUS | Status: DC | PRN

## 2023-12-14 MED ORDER — FENTANYL CITRATE (PF) 100 MCG/2ML IJ SOLN: 25.0000 ug | INTRAMUSCULAR | Status: DC | PRN

## 2023-12-14 MED ORDER — TRANEXAMIC ACID-NACL 1000-0.7 MG/100ML-% IV SOLN
1000.0000 mg | INTRAVENOUS | Status: AC
  Administered 2023-12-15: 1000 mg via INTRAVENOUS
  Filled 2023-12-14: qty 100

## 2023-12-14 MED ORDER — POVIDONE-IODINE 10 % EX SWAB
2.0000 | Freq: Once | CUTANEOUS | Status: AC
  Administered 2023-12-14: 2 via TOPICAL

## 2023-12-14 MED ORDER — CEFAZOLIN SODIUM-DEXTROSE 2-4 GM/100ML-% IV SOLN
2.0000 g | INTRAVENOUS | Status: AC
  Administered 2023-12-15: 2 g via INTRAVENOUS
  Filled 2023-12-14: qty 100

## 2023-12-14 MED ORDER — SODIUM CHLORIDE 0.9 % IV SOLN: INTRAVENOUS | Status: DC

## 2023-12-14 MED ORDER — FENTANYL CITRATE PF 50 MCG/ML IJ SOSY: 25.0000 ug | PREFILLED_SYRINGE | INTRAMUSCULAR | Status: DC | PRN

## 2023-12-14 MED ORDER — FENTANYL CITRATE PF 50 MCG/ML IJ SOSY
50.0000 ug | PREFILLED_SYRINGE | Freq: Once | INTRAMUSCULAR | Status: DC
  Filled 2023-12-14: qty 1

## 2023-12-14 NOTE — ED Triage Notes (Signed)
 Mechanical fall on Friday. C/o R hip pain. Ambulating with limp. Denies thinners or hitting head.

## 2023-12-14 NOTE — H&P (Addendum)
 Orthopedic Admit  Patient ID: Delois Silvester MRN: [id number] DOB/AGE: 78-29-1947 78 y.o.  Reason for Admission: Right femoral neck fracture Referring Physician: Jerrol  HPI: Guinevere Stephenson is an 78 y.o. female who had fall approximately 3 days ago.  She was at a family event in [place] .  She was able ambulate with significant difficulty.  The pain persisted prompting her to come into the emergency room.  She was seen at the [facility] emergency room where a femoral neck fracture was identified on x-ray and the patient was transferred to the hospital here.  She denies any antecedent right hip pain.  She does have a history of osteoporosis imperiously sustained a pubic ramus fracture but no prior hip fractures.  She denies any other injuries a result of the fall.  Denies any loss of consciousness.  Past Medical History:  Diagnosis Date   Proximal humerus fracture 01/20/2019   Tachycardia    Related to Epinephrine used for dental procedure    Past Surgical History:  Procedure Laterality Date   colonscopy     ORIF HUMERUS FRACTURE Right 01/20/2019   Procedure: OPEN REDUCTION INTERNAL FIXATION (ORIF) PROXIMAL HUMERUS FRACTURE;  Surgeon: Melita Drivers, MD;  Location: [facility];  Service: Orthopedics;  Laterality: Right;    OVARIAN CYST REMOVAL  1973    Family History  Problem Relation Age of Onset   Leukemia Mother    Goiter Mother    Stroke Father    Cancer Father    Heart attack Father    Heart disease Brother    Parkinsonism Brother     Social History:  reports that she quit smoking about 48 years ago. Her smoking use included cigarettes. She has never used smokeless tobacco. She reports that she does not drink alcohol and does not use drugs.  Allergies:  Allergies  Allergen Reactions   Codeine     Emotional-Pt reports that she feels like crying   Gluten Meal    Milk-Related Compounds    Other     MSG and oats   Sulfa Antibiotics     GI problems    Iodine  Rash    Patient states she had a rash with IV contrast    Medications: I have reviewed the patient's current medications.  ROS: Constitutional: No fever or chills Vision: No changes in vision ENT: No difficulty swallowing CV: No chest pain Pulm: No SOB or wheezing GI: No nausea or vomiting GU: No urgency or inability to hold urine Skin: No poor wound healing Neurologic: No numbness or tingling Psychiatric: No depression or anxiety Heme: No bruising Allergic: No reaction to medications or food   Exam: Blood pressure (!) 148/75, pulse 70, temperature 98.7 F (37.1 C), temperature source Oral, resp. rate 16, SpO2 98%. General: Well-appearing woman in no acute distress  Orientation:alert and oriented Mood and Affect: Mood is calm    Injured Extremity (CV, lymph, sensation, reflexes): Pain with logroll of the right leg.  No pain or tenderness about the knee or ankle.  Intact sensation in the saphenous sural tibial and peroneal nerve distributions.  5 out of 5 strength EHL FHL gastrocsoleus anterior.  Skin without lesions. No tenderness to palpation. Full painless ROM, full strength in each muscle groups without evidence of instability.   No tenderness palpation crepitus defects deformities about the bilateral upper extremities or the left lower extremity  Medical Decision Making: Data: Imaging: AP pelvis and views of the right hip reveal a right femoral neck fracture  Labs:  CBC WITH DIFFERENTIAL/PLATELET - Abnormal; Notable for the following components:      Result Value     WBC 3.3 (*)      RBC 3.37 (*)      Hemoglobin 11.2 (*)      HCT 31.8 (*)      Platelets 117 (*)      Lymphs Abs 0.5 (*)      All other components within normal limits  BASIC METABOLIC PANEL WITH GFR  PROTIME-INR      Medical history and chart was reviewed and case discussed with medical provider.  Assessment/Plan: The x-rays do show a right femoral neck fracture.  This is likely been  protected for 3 days.  Despite the patient being able to ambulate, this does require surgical stabilization.  We discussed options including either percutaneous screw fixation versus right hip hemiarthroplasty.  Given that the fracture is 56 days old, conservative failure for percutaneous screw fixation.  Therefore would recommend a right hip hemiarthroplasty.  We discussed the goals are surgery appropriate to promote mobilization and provide for good ambulation.  Risks discussed included bleeding requiring blood transfusion, bleeding causing a hematoma, infection, malunion, nonunion, damage to surrounding nerves and blood vessels, pain, hardware prominence or irritation, hardware failure, stiffness, post-traumatic arthritis, DVT/PE, compartment syndrome, and even death.  We will plan for surgery later on today  New problem w/ workup planned: High complexity diagnosis (Level 5) Surgery w/ risks or Emergency surgery: High complexity Risk (Level 5)  All others are Level 4 with comprehensive musculoskeletal exam.  Cordella Rhein, MD, MS [facility] [phone]     Addendum: And emergency service from another service came in.  This bumped our plan to operating room time.  Therefore we will plan for surgery tomorrow.  We allow her to have dinner but make her n.p.o. at midnight

## 2023-12-14 NOTE — ED Notes (Signed)
 Tricia [name] with cl called for [facility] ortho surg/or transfer

## 2023-12-14 NOTE — Anesthesia Preprocedure Evaluation (Signed)
 Anesthesia Evaluation  Patient identified by MRN, date of birth, ID band Patient awake    Reviewed: Allergy & Precautions, NPO status , Patient's Chart, lab work & pertinent test results  History of Anesthesia Complications Negative for: history of anesthetic complications  Airway Mallampati: II  TM Distance: >3 FB Neck ROM: Full    Dental  (+) Dental Advisory Given   Pulmonary neg shortness of breath, neg sleep apnea, neg COPD, neg recent URI, former smoker   breath sounds clear to auscultation       Cardiovascular hypertension, (-) angina (-) Past MI and (-) CHF  Rhythm:Regular Rate:Normal     Neuro/Psych negative neurological ROS     GI/Hepatic negative GI ROS, Neg liver ROS,,,  Endo/Other  negative endocrine ROS    Renal/GU negative Renal ROS     Musculoskeletal Right hip fx   Abdominal   Peds  Hematology  (+) Blood dyscrasia, anemia Lab Results      Component                Value               Date                      WBC                      3.3 (L)             12/14/2023                HGB                      11.2 (L)            12/14/2023                HCT                      31.8 (L)            12/14/2023                MCV                      94.4                12/14/2023                PLT                      117 (L)             12/14/2023            Lab Results      Component                Value               Date                      INR                      0.9                 12/14/2023            Denies blood thinners   Anesthesia Other Findings   Reproductive/Obstetrics  Anesthesia Physical Anesthesia Plan  ASA: 2  Anesthesia Plan: MAC and Spinal   Post-op Pain Management: Ofirmev  IV (intra-op)* and Toradol IV (intra-op)*   Induction: Intravenous  PONV Risk Score and Plan: 2 and Ondansetron , Treatment may vary due to age or  medical condition and Propofol  infusion  Airway Management Planned: Nasal Cannula, Natural Airway and Simple Face Mask  Additional Equipment: None  Intra-op Plan:   Post-operative Plan:   Informed Consent: I have reviewed the patients History and Physical, chart, labs and discussed the procedure including the risks, benefits and alternatives for the proposed anesthesia with the patient or authorized representative who has indicated his/her understanding and acceptance.     Dental advisory given  Plan Discussed with: CRNA  Anesthesia Plan Comments:          Anesthesia Quick Evaluation

## 2023-12-14 NOTE — ED Notes (Signed)
 This RN attempted to call report. Report was unable to be received. Will attempt at a later date.

## 2023-12-14 NOTE — ED Provider Notes (Signed)
 [place] EMERGENCY DEPARTMENT AT [facility] Provider Note   CSN: [id number] Arrival date & time: 12/14/23  1013     Patient presents with: [name] is a 78 y.o. [name].    Fall     78 year old [name] with medical history significant for hypertension, hyperlipidemia presenting to the emergency department with a chief complaint of right hip pain after a fall.  The patient states that she was in a bathroom and the floor was uneven and this caused her to trip and she landed on her right side.  She landed on her right hip and has been having pain, worse at night when lying flat.  She denies head trauma or loss of consciousness.  She also sustained a skin tear to the right tibia.  Her tetanus is up-to-date.  She denies any worsening redness.  She applied a dressing to the affected area.  Due to persistent pain in her right hip she presented to the emergency department for further evaluation.  She denies any other injuries or complaints.  She arrives GCS 15, ABC intact.  Prior to Admission medications   Medication Sig Start Date End Date Taking? Authorizing Provider  Ascorbic Acid (VITAMIN C PO) Take 1 tablet by mouth daily.     [provider]  B Complex Vitamins (VITAMIN B COMPLEX PO) Take by mouth.    [provider]  Calcium Carbonate (CALCIUM 500 PO) Take 2,000 mg by mouth daily.    [provider]  Cholecalciferol (VITAMIN D  PO) Take 5,000 Units by mouth daily.     [provider]  Homeopathic Products [facility] ALLERGY EYE RELIEF OP) Apply 1 drop to eye at bedtime.    [provider]  Omega-3 Fatty Acids (OMEGA 3 PO) Take 500 mg by mouth 2 (two) times daily.    [provider]  zinc gluconate 50 MG tablet Take 50 mg by mouth daily.    [provider]    Allergies: Codeine, Gluten meal, Milk-related compounds, Other, Sulfa antibiotics, and Iodine     Review of Systems  All other systems  reviewed and are negative.   Updated Vital Signs BP (!) 148/75   Pulse 70   Temp 98.7 F (37.1 C) (Oral)   Resp 16   SpO2 98%   Physical Exam Vitals and nursing note reviewed.  Constitutional:      General: She is not in acute distress.    Appearance: She is well-developed.     Comments: GCS 15, ABC intact  HENT:     Head: Normocephalic and atraumatic.  Eyes:     Extraocular Movements: Extraocular movements intact.     Conjunctiva/sclera: Conjunctivae normal.     Pupils: Pupils are equal, round, and reactive to light.  Neck:     Comments: No midline tenderness to palpation of the cervical spine.  Range of motion intact Cardiovascular:     Rate and Rhythm: Normal rate and regular rhythm.  Pulmonary:     Effort: Pulmonary effort is normal. No respiratory distress.     Breath sounds: Normal breath sounds.  Chest:     Comments: Clavicles stable nontender to AP compression.  Chest wall stable and nontender to AP and lateral compression. Abdominal:     Palpations: Abdomen is soft.     Tenderness: There is no abdominal tenderness.     Comments: Pelvis stable to lateral compression  Musculoskeletal:     Cervical back: Neck supple.  Comments: No midline tenderness to palpation of the thoracic or lumbar spine.  Extremities atraumatic with intact range of motion with the exception of skin tear with dressing in place to the right lower extremity, no surrounding erythema, surrounding bruising noted, pain with range of motion attempts to the right hip, shortening of the right lower extremity noted, minimal tenderness of the hip, 2+ distal pulses  Skin:    General: Skin is warm and dry.  Neurological:     Mental Status: She is alert.     Comments: Cranial nerves II through XII grossly intact.  Moving all 4 extremities spontaneously.  Sensation grossly intact all 4 extremities     (all labs ordered are listed, but only abnormal results are displayed) Labs Reviewed  CBC WITH  DIFFERENTIAL/PLATELET - Abnormal; Notable for the following components:      Result Value   WBC 3.3 (*)    RBC 3.37 (*)    Hemoglobin 11.2 (*)    HCT 31.8 (*)    Platelets 117 (*)    Lymphs Abs 0.5 (*)    All other components within normal limits  BASIC METABOLIC PANEL WITH GFR  PROTIME-INR    EKG: None  Radiology: DG Hip Unilat W or Wo Pelvis 2-3 Views Right Result Date: 12/14/2023 EXAM: 2 or more VIEW(S) XRAY OF THE RIGHT HIP 12/14/2023 11:22:00 AM COMPARISON: Pelvis and right hip radiographs 06/25/2015. CLINICAL HISTORY: Fall. Triage note: Mechanical fall on Friday. C/o R hip pain. Ambulating with limp. Denies thinners or hitting head. FINDINGS: BONES AND JOINTS: Mild foreshortening of the right femoral neck consistent with acute impacted right subcapital femoral neck fracture. SOFT TISSUES: The soft tissues are unremarkable. IMPRESSION: 1. Acute impacted right subcapital femoral neck fracture with mild foreshortening. Electronically signed by: Lonni Necessary MD 12/14/2023 11:33 AM EDT RP Workstation: [id number]     Procedures   Medications Ordered in the ED  fentaNYL  (SUBLIMAZE ) injection 25 mcg ( Intravenous MAR Hold 12/14/23 1511)                                    Medical Decision Making Amount and/or Complexity of Data Reviewed Labs: ordered. Radiology: ordered.  Risk Prescription drug management. Decision regarding hospitalization.    78 year old [name] with medical history significant for hypertension, hyperlipidemia presenting to the emergency department with a chief complaint of right hip pain after a fall.  The patient states that she was in a bathroom and the floor was uneven and this caused her to trip and she landed on her right side.  She landed on her right hip and has been having pain, worse at night when lying flat.  She denies head trauma or loss of consciousness.  She also sustained a skin tear to the right tibia.  Her tetanus is up-to-date.  She  denies any worsening redness.  She applied a dressing to the affected area.  Due to persistent pain in her right hip she presented to the emergency department for further evaluation.  She denies any other injuries or complaints.  She arrives GCS 15, ABC intact.  On arrival, the patient was vitally stable, presenting with shortening of the right lower extremity, concern for potential fracture.  No other injuries identified on exam beyond skin tear to the right lower extremity.  X-ray imaging of the right hip and pelvis was performed showed evidence of an impacted right femoral neck fracture. IMPRESSION:  1. Acute impacted right subcapital femoral neck fracture with mild  foreshortening.   No other injuries on primary secondary survey.  IV access obtained for administration of IV pain medication, screening labs were obtained.  Orthopedics was consulted, spoke with Dr. Reyne who recommended admission to [facility].  Admission orders placed, patient updated regarding the plan of care.      Final diagnoses:  Closed fracture of neck of right femur, initial encounter [facility])    ED Discharge Orders     None          Jerrol Agent, MD 12/14/23 1517

## 2023-12-15 ENCOUNTER — Encounter (HOSPITAL_COMMUNITY): Admission: EM | Disposition: A | Discharge status: Home Health | Payer: Self-pay | Source: Home / Self Care

## 2023-12-15 ENCOUNTER — Inpatient Hospital Stay (HOSPITAL_COMMUNITY)

## 2023-12-15 ENCOUNTER — Emergency Department (HOSPITAL_COMMUNITY): Admitting: Anesthesiology

## 2023-12-15 ENCOUNTER — Encounter (HOSPITAL_COMMUNITY): Payer: Self-pay

## 2023-12-15 ENCOUNTER — Other Ambulatory Visit: Payer: Self-pay

## 2023-12-15 DIAGNOSIS — S72001A Fracture of unspecified part of neck of right femur, initial encounter for closed fracture: Secondary | ICD-10-CM

## 2023-12-15 HISTORY — PX: HIP ARTHROPLASTY: SHX981

## 2023-12-15 LAB — SURGICAL PCR SCREEN
MRSA, PCR: NEGATIVE
Staphylococcus aureus: NEGATIVE

## 2023-12-15 LAB — TYPE AND SCREEN
ABO/RH(D): B NEG
Antibody Screen: NEGATIVE

## 2023-12-15 SURGERY — HEMIARTHROPLASTY (BIPOLAR) HIP, POSTERIOR APPROACH FOR FRACTURE
Anesthesia: General | Laterality: Right

## 2023-12-15 MED ORDER — ACETAMINOPHEN 325 MG PO TABS
325.0000 mg | ORAL_TABLET | Freq: Four times a day (QID) | ORAL | Status: DC | PRN
  Administered 2023-12-18 – 2023-12-22 (×9): 650 mg via ORAL
  Filled 2023-12-15 (×9): qty 2

## 2023-12-15 MED ORDER — CEFAZOLIN SODIUM-DEXTROSE 2-4 GM/100ML-% IV SOLN
2.0000 g | Freq: Four times a day (QID) | INTRAVENOUS | Status: AC
  Administered 2023-12-16 (×2): 2 g via INTRAVENOUS
  Filled 2023-12-15 (×2): qty 100

## 2023-12-15 MED ORDER — ACETAMINOPHEN 10 MG/ML IV SOLN
INTRAVENOUS | Status: DC | PRN
  Administered 2023-12-15: 1000 mg via INTRAVENOUS

## 2023-12-15 MED ORDER — POLYETHYLENE GLYCOL 3350 17 G PO PACK
17.0000 g | PACK | Freq: Every day | ORAL | Status: DC | PRN
  Administered 2023-12-18: 17 g via ORAL
  Filled 2023-12-15: qty 1

## 2023-12-15 MED ORDER — MORPHINE SULFATE (PF) 2 MG/ML IV SOLN: 0.5000 mg | INTRAVENOUS | Status: DC | PRN

## 2023-12-15 MED ORDER — BUPIVACAINE IN DEXTROSE 0.75-8.25 % IT SOLN
INTRATHECAL | Status: DC | PRN
  Administered 2023-12-15: 1.4 mL via INTRATHECAL

## 2023-12-15 MED ORDER — SENNA 8.6 MG PO TABS
1.0000 | ORAL_TABLET | Freq: Two times a day (BID) | ORAL | Status: DC
  Administered 2023-12-15 – 2023-12-22 (×10): 8.6 mg via ORAL
  Filled 2023-12-15 (×14): qty 1

## 2023-12-15 MED ORDER — OXYCODONE HCL 5 MG/5ML PO SOLN: 5.0000 mg | Freq: Once | ORAL | Status: DC | PRN

## 2023-12-15 MED ORDER — OXYCODONE HCL 5 MG PO TABS: 5.0000 mg | ORAL_TABLET | Freq: Once | ORAL | Status: DC | PRN

## 2023-12-15 MED ORDER — ONDANSETRON HCL 4 MG/2ML IJ SOLN
INTRAMUSCULAR | Status: DC | PRN
  Administered 2023-12-15: 4 mg via INTRAVENOUS

## 2023-12-15 MED ORDER — FENTANYL CITRATE (PF) 250 MCG/5ML IJ SOLN
INTRAMUSCULAR | Status: DC | PRN
  Administered 2023-12-15: 50 ug via INTRAVENOUS

## 2023-12-15 MED ORDER — HYDROCODONE-ACETAMINOPHEN 7.5-325 MG PO TABS
1.0000 | ORAL_TABLET | ORAL | Status: DC | PRN
  Administered 2023-12-16: 1 via ORAL

## 2023-12-15 MED ORDER — MENTHOL 3 MG MT LOZG
1.0000 | LOZENGE | OROMUCOSAL | Status: DC | PRN
  Filled 2023-12-15: qty 9

## 2023-12-15 MED ORDER — PANTOPRAZOLE SODIUM 40 MG PO TBEC
40.0000 mg | DELAYED_RELEASE_TABLET | Freq: Every day | ORAL | Status: DC
  Administered 2023-12-15 – 2023-12-22 (×8): 40 mg via ORAL
  Filled 2023-12-15 (×8): qty 1

## 2023-12-15 MED ORDER — STERILE WATER FOR IRRIGATION IR SOLN
Status: DC | PRN
  Administered 2023-12-15: 1000 mL

## 2023-12-15 MED ORDER — FENTANYL CITRATE (PF) 100 MCG/2ML IJ SOLN: 25.0000 ug | INTRAMUSCULAR | Status: DC | PRN

## 2023-12-15 MED ORDER — CHLORHEXIDINE GLUCONATE 0.12 % MT SOLN: 15.0000 mL | Freq: Once | OROMUCOSAL | Status: AC

## 2023-12-15 MED ORDER — PHENOL 1.4 % MT LIQD: 1.0000 | OROMUCOSAL | Status: DC | PRN

## 2023-12-15 MED ORDER — ORAL CARE MOUTH RINSE: 15.0000 mL | Freq: Once | OROMUCOSAL | Status: AC

## 2023-12-15 MED ORDER — VANCOMYCIN HCL 1000 MG IV SOLR
INTRAVENOUS | Status: AC
  Filled 2023-12-15: qty 20

## 2023-12-15 MED ORDER — FENTANYL CITRATE (PF) 250 MCG/5ML IJ SOLN
INTRAMUSCULAR | Status: AC
  Filled 2023-12-15: qty 5

## 2023-12-15 MED ORDER — PHENYLEPHRINE 80 MCG/ML (10ML) SYRINGE FOR IV PUSH (FOR BLOOD PRESSURE SUPPORT)
PREFILLED_SYRINGE | INTRAVENOUS | Status: AC
  Filled 2023-12-15: qty 10

## 2023-12-15 MED ORDER — VANCOMYCIN HCL 1000 MG IV SOLR
INTRAVENOUS | Status: DC | PRN
  Administered 2023-12-15: 1000 mg via TOPICAL

## 2023-12-15 MED ORDER — PROPOFOL 500 MG/50ML IV EMUL
INTRAVENOUS | Status: DC | PRN
Start: 2023-12-15 — End: 2023-12-16
  Administered 2023-12-15: 20 mg via INTRAVENOUS
  Administered 2023-12-15: 50 ug/kg/min via INTRAVENOUS

## 2023-12-15 MED ORDER — 0.9 % SODIUM CHLORIDE (POUR BTL) OPTIME
TOPICAL | Status: DC | PRN
  Administered 2023-12-15: 1000 mL

## 2023-12-15 MED ORDER — EPHEDRINE SULFATE (PRESSORS) 50 MG/ML IJ SOLN
INTRAMUSCULAR | Status: DC | PRN
  Administered 2023-12-15: 5 mg via INTRAVENOUS

## 2023-12-15 MED ORDER — ONDANSETRON HCL 4 MG/2ML IJ SOLN
INTRAMUSCULAR | Status: AC
  Filled 2023-12-15: qty 2

## 2023-12-15 MED ORDER — HYDROCODONE-ACETAMINOPHEN 5-325 MG PO TABS
1.0000 | ORAL_TABLET | ORAL | Status: DC | PRN
  Administered 2023-12-16 (×2): 1 via ORAL
  Filled 2023-12-15 (×4): qty 1

## 2023-12-15 MED ORDER — ONDANSETRON HCL 4 MG/2ML IJ SOLN
4.0000 mg | Freq: Four times a day (QID) | INTRAMUSCULAR | Status: DC | PRN
Start: 2023-12-15 — End: 2023-12-22

## 2023-12-15 MED ORDER — PROPOFOL 10 MG/ML IV BOLUS
INTRAVENOUS | Status: AC
  Filled 2023-12-15: qty 20

## 2023-12-15 MED ORDER — LACTATED RINGERS IV SOLN: INTRAVENOUS | Status: DC

## 2023-12-15 MED ORDER — CHLORHEXIDINE GLUCONATE 0.12 % MT SOLN
OROMUCOSAL | Status: AC
  Administered 2023-12-15: 15 mL via OROMUCOSAL
  Filled 2023-12-15: qty 15

## 2023-12-15 MED ORDER — EPHEDRINE 5 MG/ML INJ
INTRAVENOUS | Status: AC
  Filled 2023-12-15: qty 5

## 2023-12-15 MED ORDER — ASPIRIN 325 MG PO TBEC
325.0000 mg | DELAYED_RELEASE_TABLET | Freq: Every day | ORAL | Status: DC
  Administered 2023-12-16 – 2023-12-22 (×7): 325 mg via ORAL
  Filled 2023-12-15 (×7): qty 1

## 2023-12-15 MED ORDER — PHENYLEPHRINE 80 MCG/ML (10ML) SYRINGE FOR IV PUSH (FOR BLOOD PRESSURE SUPPORT)
PREFILLED_SYRINGE | INTRAVENOUS | Status: DC | PRN
  Administered 2023-12-15 (×2): 160 ug via INTRAVENOUS

## 2023-12-15 MED ORDER — ONDANSETRON HCL 4 MG PO TABS
4.0000 mg | ORAL_TABLET | Freq: Four times a day (QID) | ORAL | Status: DC | PRN
Start: 2023-12-15 — End: 2023-12-22

## 2023-12-15 MED ORDER — DOCUSATE SODIUM 100 MG PO CAPS
100.0000 mg | ORAL_CAPSULE | Freq: Two times a day (BID) | ORAL | Status: DC
  Administered 2023-12-15 – 2023-12-22 (×12): 100 mg via ORAL
  Filled 2023-12-15 (×14): qty 1

## 2023-12-15 SURGICAL SUPPLY — 37 items
BAG COUNTER SPONGE SURGICOUNT (BAG) ×2 IMPLANT
BALL HIP ARTICU 28 +5 (Hips) IMPLANT
BLADE SAW SGTL 73X25 THK (BLADE) ×2 IMPLANT
BRUSH SCRUB EZ PLAIN DRY (MISCELLANEOUS) ×4 IMPLANT
COVER SURGICAL LIGHT HANDLE (MISCELLANEOUS) ×2 IMPLANT
DRAPE INCISE IOBAN 85X60 (DRAPES) ×2 IMPLANT
DRAPE SURG ORHT 6 SPLT 77X108 (DRAPES) ×4 IMPLANT
DRAPE U-SHAPE 47X51 STRL (DRAPES) ×2 IMPLANT
DRSG AQUACEL AG ADV 3.5X10 (GAUZE/BANDAGES/DRESSINGS) IMPLANT
DRSG MEPILEX POST OP 4X8 (GAUZE/BANDAGES/DRESSINGS) ×2 IMPLANT
ELECT BLADE 6.5 EXT (BLADE) IMPLANT
ELECT CAUTERY BLADE 6.4 (BLADE) IMPLANT
ELECTRODE REM PT RTRN 9FT ADLT (ELECTROSURGICAL) ×2 IMPLANT
GLOVE BIO SURGEON STRL SZ7.5 (GLOVE) ×2 IMPLANT
GLOVE BIO SURGEON STRL SZ8 (GLOVE) ×2 IMPLANT
GLOVE BIOGEL PI IND STRL 8 (GLOVE) ×4 IMPLANT
GLOVE SURG ORTHO LTX SZ7.5 (GLOVE) ×4 IMPLANT
GOWN STRL REUS W/ TWL LRG LVL3 (GOWN DISPOSABLE) ×2 IMPLANT
GOWN STRL REUS W/ TWL XL LVL3 (GOWN DISPOSABLE) ×2 IMPLANT
HEAD BIPOLAR PROS AML 45 (Hips) IMPLANT
KIT BASIN OR (CUSTOM PROCEDURE TRAY) ×2 IMPLANT
KIT TURNOVER KIT B (KITS) ×2 IMPLANT
MANIFOLD NEPTUNE II (INSTRUMENTS) ×2 IMPLANT
NS IRRIG 1000ML POUR BTL (IV SOLUTION) ×2 IMPLANT
PACK TOTAL JOINT (CUSTOM PROCEDURE TRAY) ×2 IMPLANT
PAD ARMBOARD POSITIONER FOAM (MISCELLANEOUS) ×4 IMPLANT
PENCIL BUTTON HOLSTER BLD 10FT (ELECTRODE) IMPLANT
PILLOW ABDUCTION MEDIUM (MISCELLANEOUS) IMPLANT
RETRIEVER SUT HEWSON (MISCELLANEOUS) ×2 IMPLANT
SET HNDPC FAN SPRY TIP SCT (DISPOSABLE) IMPLANT
STAPLER SKIN PROX 35W (STAPLE) ×2 IMPLANT
STEM SUMMIT BASIC PRESSFIT SZ5 (Hips) IMPLANT
SUT VIC AB 1 CT1 27XBRD ANTBC (SUTURE) IMPLANT
SUT VIC AB 2-0 CT1 TAPERPNT 27 (SUTURE) IMPLANT
TOWEL GREEN STERILE (TOWEL DISPOSABLE) ×2 IMPLANT
TOWEL GREEN STERILE FF (TOWEL DISPOSABLE) ×2 IMPLANT
WATER STERILE IRR 1000ML POUR (IV SOLUTION) ×2 IMPLANT

## 2023-12-15 NOTE — Anesthesia Procedure Notes (Signed)
 Spinal  Patient location during procedure: OR Start time: 12/15/2023 5:18 PM End time: 12/15/2023 5:22 PM Reason for block: surgical anesthesia Staffing Performed: anesthesiologist  Anesthesiologist: Leopoldo Bruckner, MD Performed by: Leopoldo Bruckner, MD Authorized by: Leopoldo Bruckner, MD   Preanesthetic Checklist Completed: patient identified, IV checked, risks and benefits discussed, surgical consent, monitors and equipment checked, pre-op evaluation and timeout performed Spinal Block Patient position: right lateral decubitus Prep: DuraPrep Patient monitoring: heart rate, cardiac monitor, continuous pulse ox and blood pressure Approach: midline Location: L4-5 Injection technique: single-shot Needle Needle type: Pencan  Needle gauge: 24 G Needle length: 9 cm Assessment Events: CSF return

## 2023-12-15 NOTE — Progress Notes (Signed)
 Orthopaedic Progress Note  S: Patient is resting comfortably.  Has no new complaints  O:  Vitals:   12/14/23 2020 12/15/23 0500  BP: (!) 130/90 121/65  Pulse: 70 61  Resp: 18 18  Temp: 98.7 F (37.1 C) 98.7 F (37.1 C)  SpO2: 95% 97%    Well-appearing woman in no acute distress.  5 out of 5 strength EHL FHL gastrocs and tibialis anterior bilaterally  Imaging: No new imaging studies  Labs:  Results for orders placed or performed during the hospital encounter of 12/14/23 (from the past 24 hours)  CBC with Differential     Status: Abnormal   Collection Time: 12/14/23 11:51 AM  Result Value Ref Range   WBC 3.3 (L) 4.0 - 10.5 K/uL   RBC 3.37 (L) 3.87 - 5.11 MIL/uL   Hemoglobin 11.2 (L) 12.0 - 15.0 g/dL   HCT 68.1 (L) 63.9 - 53.9 %   MCV 94.4 80.0 - 100.0 fL   MCH 33.2 26.0 - 34.0 pg   MCHC 35.2 30.0 - 36.0 g/dL   RDW 88.0 88.4 - 84.4 %   Platelets 117 (L) 150 - 400 K/uL   nRBC 0.0 0.0 - 0.2 %   Neutrophils Relative % 69 %   Neutro Abs 2.3 1.7 - 7.7 K/uL   Lymphocytes Relative 15 %   Lymphs Abs 0.5 (L) 0.7 - 4.0 K/uL   Monocytes Relative 11 %   Monocytes Absolute 0.4 0.1 - 1.0 K/uL   Eosinophils Relative 4 %   Eosinophils Absolute 0.1 0.0 - 0.5 K/uL   Basophils Relative 1 %   Basophils Absolute 0.0 0.0 - 0.1 K/uL   Immature Granulocytes 0 %   Abs Immature Granulocytes 0.01 0.00 - 0.07 K/uL  Basic metabolic panel     Status: None   Collection Time: 12/14/23 11:51 AM  Result Value Ref Range   Sodium 142 135 - 145 mmol/L   Potassium 3.7 3.5 - 5.1 mmol/L   Chloride 108 98 - 111 mmol/L   CO2 22 22 - 32 mmol/L   Glucose, Bld 77 70 - 99 mg/dL   BUN 13 8 - 23 mg/dL   Creatinine, Ser 9.23 0.44 - 1.00 mg/dL   Calcium 9.4 8.9 - 89.6 mg/dL   GFR, Estimated >39 >39 mL/min   Anion gap 12 5 - 15  Protime-INR     Status: None   Collection Time: 12/14/23 11:51 AM  Result Value Ref Range   Prothrombin Time 13.1 11.4 - 15.2 seconds   INR 0.9 0.8 - 1.2  Surgical pcr screen      Status: None   Collection Time: 12/14/23 11:54 PM   Specimen: Nasal Mucosa; Nasal Swab  Result Value Ref Range   MRSA, PCR NEGATIVE NEGATIVE   Staphylococcus aureus NEGATIVE NEGATIVE    Assessment: Right impacted femoral neck fracture  Injuries: Right femoral neck fracture  Weightbearing: Nonweightbearing right leg at this time  Insicional and dressing care: N/A  Orthopedic device(s): N/A    The patient is awaiting surgery for her right femoral neck fracture.  This will hopefully be performed today.  My first availability is currently scheduled for 4:00 they will see if there is any possibility having another surgeon performed the sooner.  In the meantime we will keep her n.p.o.  Continue the current pain regimen.   Cordella Rhein, MD, MS [facility] [phone]

## 2023-12-15 NOTE — Plan of Care (Signed)
   Problem: Coping: Goal: Level of anxiety will decrease Outcome: Progressing   Problem: Pain Managment: Goal: General experience of comfort will improve and/or be controlled Outcome: Progressing   Problem: Safety: Goal: Ability to remain free from injury will improve Outcome: Progressing

## 2023-12-15 NOTE — TOC CAGE-AID Note (Signed)
 Transition of Care [facility]) - CAGE-AID Screening   Patient Details  Name: Tricia [name] MRN: [id number] Date of Birth: 01-28-1946  Transition of Care [facility]) CM/SW Contact:    Hebe Merriwether E Mehlani Blankenburg, LCSW Phone Number: 12/15/2023, 11:24 AM   Clinical Narrative:    CAGE-AID Screening:    Have You Ever Felt You Ought to Cut Down on Your Drinking or Drug Use?: No Have People Annoyed You By [employer] Your Drinking Or Drug Use?: No Have You Felt Bad Or Guilty About Your Drinking Or Drug Use?: No Have You Ever Had a Drink or Used Drugs First Thing In The Morning to Steady Your Nerves or to Get Rid of a Hangover?: No CAGE-AID Score: 0  Substance Abuse Education Offered: No

## 2023-12-15 NOTE — Op Note (Signed)
 DATE OF SURGERY:  12/15/2023  TIME: 6:48 PM  PATIENT NAME:  Tricia [name]  AGE: 78 y.o.  PRE-OPERATIVE DIAGNOSIS:  RIGHT HIP FRACTURE  POST-OPERATIVE DIAGNOSIS:  SAME  PROCEDURE:  HEMIARTHROPLASTY (BIPOLAR) HIP, POSTERIOR APPROACH FOR FRACTURE  SURGEON:  Cordella SHAUNNA Rhein  ASSISTANT:  None  OPERATIVE IMPLANTS:  Implant Name Type Inv. Item Serial No. Manufacturer Lot No. LRB No. Used Action  STEM SUMMIT BASIC PRESSFIT SZ5 - [id number] Hips STEM SUMMIT BASIC PRESSFIT SZ5  [employer] [id number] Right 1 Implanted  BALL HIP ARTICU 28 +5 - [id number] Hips BALL HIP ARTICU 28 +5  DEPU[employer]TH[employer] [id number] Right 1 Implanted  HEAD BIPOLAR PROS AML 45 - [id number] Hips HEAD BIPOLAR PROS AML 45  DEPU[employer]TH[employer] [id number] Right 1 Implanted    UNIQUE ASPECTS OF THE CASE:  none  ESTIMATED BLOOD LOSS: 150  PREOPERATIVE INDICATIONS:  Tricia [name] is a 78 y.o. year old who fell and suffered an RIGHT HIP FRACTURE. She was brought into the ER and then admitted and optimized and then elected for surgical intervention.    The risks benefits and alternatives were discussed with the patient including but not limited to the risks of nonoperative treatment, versus surgical intervention including infection, leg length inequality, hip dislocation, bleeding, nerve injury, malunion, nonunion, hardware prominence, hardware failure, need for hardware removal, blood clots, cardiopulmonary complications, morbidity, mortality, among others, and they were willing to proceed.    OPERATIVE PROCEDURE:  The patient was brought to the operating room and placed in the supine position. Anesthesia was administered. She was placed on the operating room table.  The patient was positioned in the lateral decubitus position with the right leg up.    An incision just posterior to the .greater trochanter with a 10 blade.  Electrocautery was used to dissect to the fascia.  The fascia was incised in  line with its fibers.  A Charnley retractor was placed.   The bursa was removed.  The short external rotators were identified and tagged with a #1 Vicryl.  They were reflected off the bone.  The posterior capsule was identified and incised.  The fracture was identified.  A completely neck cut was made using a cutting guide and saw.  The corkscrew was used to remove the head ball.  This was measured and a 45 trial head was selected.  This was placed into the acetabulum and had excellent congruity.  We exposed the proximal femur.  A cookie cutter osteotome was used followed by a canal seeker.  A lateralizing reamer was next used.  We then used sequential broaches up to a size 5.  This had excellent stability in the femur.  We used a standard offset and +5 neck length trial.  The trial head ball was placed.  The hip was relocated and taken through a range of motion and found to have excellent stability and equal leg lengths.  The trial implants were removed.  The wound was irrigated.  The size 5 [employer] [place] stem was tamped into position.  The 45 head ball was tamped on the Morris taper.  The hip was reduced and again taken through a range of motion.  Excellent stabilize and equal leg lengths were noted.  The wounds were irrigated copiously, and vancomycin  powder was placed in the wounds.  The capsule was reapproximated using a .  The short external rotators were reattached to the bone using a #1 vicryl.  The gluteal fascia was closed with #1  Vicryl, and skin was closed with 2-0 Vicryl and staples.  Sterile dressing was applied with aquacel.  A hip abduction pillow was placed. The patient was awakened and returned to PACU in stable and satisfactory condition.  There were no complications and the patient tolerated the procedure well.   Post op recs: WB: WBAT right LE, posterior hip precautions Abx: ancef  x23 hours post op Imaging: PACU xrays Dressing: keep intact until follow up, change PRN if soiled or  saturated. DVT prophylaxis: lovenox starting POD1 x4 weeks Follow up: 2 weeks after surgery for a wound check with Dr. Reyne at [facility].  Address: [address], [place], [place] [address]  Office Phone: [phone]  Cathlyn Reyne, MD Orthopaedic Surgery

## 2023-12-15 NOTE — Plan of Care (Signed)
  Problem: Education: Goal: Knowledge of General Education information will improve Description: Including pain rating scale, medication(s)/side effects and non-pharmacologic comfort measures Outcome: Progressing   Problem: Clinical Measurements: Goal: Will remain free from infection Outcome: Progressing   Problem: Pain Managment: Goal: General experience of comfort will improve and/or be controlled Outcome: Progressing   Problem: Safety: Goal: Ability to remain free from injury will improve Outcome: Progressing

## 2023-12-16 ENCOUNTER — Encounter (HOSPITAL_COMMUNITY): Payer: Self-pay

## 2023-12-16 LAB — COMPREHENSIVE METABOLIC PANEL WITH GFR
ALT: 18 U/L (ref 0–44)
AST: 39 U/L (ref 15–41)
Albumin: 3 g/dL — ABNORMAL LOW (ref 3.5–5.0)
Alkaline Phosphatase: 71 U/L (ref 38–126)
Anion gap: 11 (ref 5–15)
BUN: 9 mg/dL (ref 8–23)
CO2: 22 mmol/L (ref 22–32)
Calcium: 8.7 mg/dL — ABNORMAL LOW (ref 8.9–10.3)
Chloride: 105 mmol/L (ref 98–111)
Creatinine, Ser: 0.8 mg/dL (ref 0.44–1.00)
GFR, Estimated: >60 mL/min (ref >60)
Glucose, Bld: 202 mg/dL — ABNORMAL HIGH (ref 70–99)
Potassium: 4 mmol/L (ref 3.5–5.1)
Sodium: 138 mmol/L (ref 135–145)
Total Bilirubin: 1.3 mg/dL — ABNORMAL HIGH (ref 0.0–1.2)
Total Protein: 5.3 g/dL — ABNORMAL LOW (ref 6.5–8.1)

## 2023-12-16 LAB — CBC
HCT: 27.4 % — ABNORMAL LOW (ref 36.0–46.0)
Hemoglobin: 9.7 g/dL — ABNORMAL LOW (ref 12.0–15.0)
MCH: 32.9 pg (ref 26.0–34.0)
MCHC: 35.4 g/dL (ref 30.0–36.0)
MCV: 92.9 fL (ref 80.0–100.0)
Platelets: 121 K/uL — ABNORMAL LOW (ref 150–400)
RBC: 2.95 MIL/uL — ABNORMAL LOW (ref 3.87–5.11)
RDW: 11.6 % (ref 11.5–15.5)
WBC: 4.5 K/uL (ref 4.0–10.5)
nRBC: 0 % (ref 0.0–0.2)

## 2023-12-16 LAB — CBC WITH DIFFERENTIAL/PLATELET
Abs Immature Granulocytes: 0.03 K/uL (ref 0.00–0.07)
Basophils Absolute: 0 K/uL (ref 0.0–0.1)
Basophils Relative: 0 %
Eosinophils Absolute: 0 K/uL (ref 0.0–0.5)
Eosinophils Relative: 0 %
HCT: 27.7 % — ABNORMAL LOW (ref 36.0–46.0)
Hemoglobin: 10 g/dL — ABNORMAL LOW (ref 12.0–15.0)
Immature Granulocytes: 1 %
Lymphocytes Relative: 6 %
Lymphs Abs: 0.3 K/uL — ABNORMAL LOW (ref 0.7–4.0)
MCH: 33.4 pg (ref 26.0–34.0)
MCHC: 36.1 g/dL — ABNORMAL HIGH (ref 30.0–36.0)
MCV: 92.6 fL (ref 80.0–100.0)
Monocytes Absolute: 0.6 K/uL (ref 0.1–1.0)
Monocytes Relative: 12 %
Neutro Abs: 4.3 K/uL (ref 1.7–7.7)
Neutrophils Relative %: 81 %
Platelets: 126 K/uL — ABNORMAL LOW (ref 150–400)
RBC: 2.99 MIL/uL — ABNORMAL LOW (ref 3.87–5.11)
RDW: 11.5 % (ref 11.5–15.5)
WBC: 5.2 K/uL (ref 4.0–10.5)
nRBC: 0 % (ref 0.0–0.2)

## 2023-12-16 LAB — BASIC METABOLIC PANEL WITH GFR
Anion gap: 12 (ref 5–15)
BUN: 11 mg/dL (ref 8–23)
CO2: 21 mmol/L — ABNORMAL LOW (ref 22–32)
Calcium: 8.5 mg/dL — ABNORMAL LOW (ref 8.9–10.3)
Chloride: 105 mmol/L (ref 98–111)
Creatinine, Ser: 0.74 mg/dL (ref 0.44–1.00)
GFR, Estimated: >60 mL/min (ref >60)
Glucose, Bld: 110 mg/dL — ABNORMAL HIGH (ref 70–99)
Potassium: 3.9 mmol/L (ref 3.5–5.1)
Sodium: 138 mmol/L (ref 135–145)

## 2023-12-16 MED ORDER — ASPIRIN 325 MG PO TBEC: 325.0000 mg | DELAYED_RELEASE_TABLET | Freq: Every day | ORAL | 0 refills | Status: AC

## 2023-12-16 MED ORDER — DOCUSATE SODIUM 100 MG PO CAPS: 100.0000 mg | ORAL_CAPSULE | Freq: Two times a day (BID) | ORAL | 0 refills | Status: AC

## 2023-12-16 MED ORDER — HYDROCODONE-ACETAMINOPHEN 5-325 MG PO TABS: 1.0000 | ORAL_TABLET | ORAL | 0 refills | Status: AC | PRN

## 2023-12-16 NOTE — Evaluation (Signed)
 Physical Therapy Evaluation Patient Details Name: Tricia [name] MRN: [id number] DOB: 07-Mar-1946 Today's Date: 12/16/2023  History of Present Illness  78 y.o. female presents to [facility] 12/14/23 after falling. Pt with R femoral neck fx. 9/2 R hemiarthroplasty. PMHx: tachycardia  Clinical Impression  Pt in bed upon arrival and agreeable to PT eval. PTA, pt was independent for mobility and ADLs with no AD. Pt presents with decreased safety awareness, impaired problem solving, R LE weakness, and impaired balance/gait. In today's session, pt required ModA for bed mobility and MinA to stand with RW. Able to ambulate 42ft with MinA and RW. Increased difficulty fully weight-bearing on R LE with distance limited by pain. Pt had difficulty remembering earlier conversations and needed repetitive cueing for safety throughout session. Pt has intermittent assist available at home. Recommending post-acute rehab <3hrs to work towards independence with mobility. Pt would benefit from acute skilled PT with current functional limitations listed below (see PT Problem List). Acute PT to follow.         If plan is discharge home, recommend the following: A lot of help with walking and/or transfers;A lot of help with bathing/dressing/bathroom;Assistance with cooking/housework;Assist for transportation;Help with stairs or ramp for entrance   Can travel by private vehicle   Yes    Equipment Recommendations BSC/3in1     Functional Status Assessment Patient has had a recent decline in their functional status and demonstrates the ability to make significant improvements in function in a reasonable and predictable amount of time.     Precautions / Restrictions Precautions Precautions: Posterior Hip Precaution Booklet Issued: Yes (comment) Recall of Precautions/Restrictions: Intact Precaution/Restrictions Comments: hip ABD brace in bed Restrictions Weight Bearing Restrictions Per Provider Order: Yes RLE Weight  Bearing Per Provider Order: Weight bearing as tolerated      Mobility  Bed Mobility Overal bed mobility: Needs Assistance Bed Mobility: Supine to Sit    Supine to sit: Mod assist, Used rails    General bed mobility comments: cues to use LLE to guide R LE off the EOB. Assist to shifts hips and raise trunk    Transfers Overall transfer level: Needs assistance Equipment used: Rolling walker (2 wheels) Transfers: Sit to/from Stand Sit to Stand: Min assist    General transfer comment: MinA for slight steadying assist. Cues to keep R LE slightly anterior to self due to pain with WB.Cues for hand placement    Ambulation/Gait Ambulation/Gait assistance: Min assist Gait Distance (Feet): 15 Feet Assistive device: Rolling walker (2 wheels) Gait Pattern/deviations: Decreased step length - right, Decreased stance time - right, Step-to pattern Gait velocity: decr    General Gait Details: step to pattern with decreased WB and step length on R LE. Cueing for technique and assist with RW during turns    Balance Overall balance assessment: Needs assistance Sitting-balance support: No upper extremity supported, Feet supported Sitting balance-Leahy Scale: Fair     Standing balance support: Bilateral upper extremity supported, During functional activity, Reliant on assistive device for balance Standing balance-Leahy Scale: Poor Standing balance comment: reliant on RW for support       Pertinent Vitals/Pain Pain Assessment Pain Assessment: Faces Faces Pain Scale: Hurts even more Pain Location: R hip Pain Descriptors / Indicators: Aching, Discomfort, Grimacing Pain Intervention(s): Limited activity within patient's tolerance, Monitored during session, Repositioned    Home Living Family/patient expects to be discharged to:: Private residence Living Arrangements: Children (son) Available Help at Discharge: Family;Available PRN/intermittently Type of Home: House Home Access: Stairs to  enter Entrance Stairs-Rails: Right;Left;Can reach both Entrance Stairs-Number of Steps: 5   Home Layout: One level Home Equipment: Tub bench;Rolling Walker (2 wheels);Cane - single point;Crutches Additional Comments: Pt lives with son and daughter in law, they work during the day. Pt reports she also lives in [place]  with her husband who has memory problems. Staying here for a wedding.    Prior Function Prior Level of Function : Independent/Modified Independent;Driving;History of Falls (last six months)    Mobility Comments: Ind with no AD ADLs Comments: Ind     Extremity/Trunk Assessment   Upper Extremity Assessment Upper Extremity Assessment: Defer to OT evaluation    Lower Extremity Assessment Lower Extremity Assessment: RLE deficits/detail RLE Deficits / Details: able to perform LAQ and ankle DF/PF with ROM WFL. Deferred formal MMT due to pain RLE Sensation: WNL    Cervical / Trunk Assessment Cervical / Trunk Assessment: Normal  Communication   Communication Communication: No apparent difficulties    Cognition Arousal: Alert Behavior During Therapy: WFL for tasks assessed/performed   PT - Cognitive impairments: No family/caregiver present to determine baseline, Orientation, Awareness, Memory, Attention, Problem solving, Sequencing, Safety/Judgement   Orientation impairments: Place  PT - Cognition Comments: Pt originally thought she was at home, able to self-correct and reported being at [facility]. Difficulty remembering conversations and precautions. Following commands: Intact       Cueing Cueing Techniques: Verbal cues     General Comments General comments (skin integrity, edema, etc.): Gave surgical hip HEP    Exercises General Exercises - Lower Extremity Ankle Circles/Pumps: AROM, Both, 10 reps, Supine Quad Sets: AROM, Right, 5 reps, Supine   Assessment/Plan    PT Assessment Patient needs continued PT services  PT Problem List Decreased strength;Decreased  activity tolerance;Decreased balance;Decreased mobility;Decreased knowledge of use of DME;Decreased safety awareness;Decreased knowledge of precautions       PT Treatment Interventions DME instruction;Gait training;Functional mobility training;Stair training;Therapeutic activities;Therapeutic exercise;Balance training;Neuromuscular re-education;Patient/family education    PT Goals (Current goals can be found in the Care Plan section)  Acute Rehab PT Goals Patient Stated Goal: to get more rehab PT Goal Formulation: With patient Time For Goal Achievement: 12/30/23 Potential to Achieve Goals: Good    Frequency Min 2X/week        AM-PAC PT 6 Clicks Mobility  Outcome Measure Help needed turning from your back to your side while in a flat bed without using bedrails?: A Lot Help needed moving from lying on your back to sitting on the side of a flat bed without using bedrails?: A Lot Help needed moving to and from a bed to a chair (including a wheelchair)?: A Little Help needed standing up from a chair using your arms (e.g., wheelchair or bedside chair)?: A Little Help needed to walk in hospital room?: A Little Help needed climbing 3-5 steps with a railing? : Total 6 Click Score: 14    End of Session Equipment Utilized During Treatment: Gait belt Activity Tolerance: Patient tolerated treatment well Patient left: in chair;with call bell/phone within reach Nurse Communication: Mobility status PT Visit Diagnosis: Unsteadiness on feet (R26.81);Other abnormalities of gait and mobility (R26.89);History of falling (Z91.81);Muscle weakness (generalized) (M62.81)    Time: 8966-8894 PT Time Calculation (min) (ACUTE ONLY): 32 min   Charges:   PT Evaluation $PT Eval Low Complexity: 1 Low PT Treatments $Gait Training: 8-22 mins PT General Charges $$ ACUTE PT VISIT: 1 Visit       Kate ORN, PT, DPT Secure Chat Preferred  Rehab Office [phone]  Latronda Spink E Wendolyn 12/16/2023, 12:49 PM

## 2023-12-16 NOTE — Discharge Instructions (Addendum)
 Orthopedic Discharge Instructions  Diet: As you were doing prior to hospitalization   Shower:  May shower but keep the wounds dry, use an occlusive plastic wrap, NO SOAKING IN TUB.  If the bandage gets wet, change with a clean dry gauze.  If you have a splint on, leave the splint in place and keep the splint dry with a plastic bag.  Dressing:  You may change your dressing 3-5 days after surgery, unless you have a splint.  If the dressing remains clean and dry it can also be left on until follow up. If you change the dressing replace with clean gauze and tape or ace wrap. If you have a splint, then just leave the splint in place and we will change your bandages during your first follow-up appointment.  If water  gets in the splint or the splint gets saturated please call the clinic and we can see you to change your splint.  If you had hand or foot surgery, we will plan to remove your stitches in about 2 weeks in the office.  For all other surgeries, there are sticky tapes (steri-strips) on your wounds and all the stitches are absorbable.  Leave the steri-strips in place when changing your dressings, they will peel off with time, usually 2-3 weeks.  Activity:  Increase activity slowly as tolerated, but follow the weight bearing instructions below.  The rules on driving is that you can not be taking narcotics while you drive, and you must feel in control of the vehicle.    Weight Bearing:   As tolerated with posterior hip precautions.    Blood clot prevention (DVT Prophylaxis): After surgery you are at an increased risk for a blood clot. you were prescribed a blood thinner, Aspirin  325mg , to be taken once daily for a total of 4 weeks from surgery to help reduce your risk of getting a blood clot. This will help prevent a blood clot. Signs of a pulmonary embolus (blood clot in the lungs) include sudden short of breath, feeling lightheaded or dizzy, chest pain with a deep breath, rapid pulse rapid breathing.  Signs of a blood clot in your arms or legs include new unexplained swelling and cramping, warm, red or darkened skin around the painful area. Please call the office or 911 right away if these signs or symptoms develop. To prevent constipation: you may use a stool softener such as -  Colace (over the counter) 100 mg by mouth twice a day  Drink plenty of fluids (prune juice may be helpful) and high fiber foods Miralax  (over the counter) for constipation as needed.    Itching:  If you experience itching with your medications, try taking only a single pain pill, or even half a pain pill at a time.  You may take up to 10 pain pills per day, and you can also use benadryl  over the counter for itching or also to help with sleep.   Precautions:  If you experience chest pain or shortness of breath - call 911 immediately for transfer to the hospital emergency department!!   Call office [phone]) for the following: Temperature greater than 101F Persistent nausea and vomiting Severe uncontrolled pain Redness, tenderness, or signs of infection (pain, swelling, redness, odor or green/yellow discharge around the site) Difficulty breathing, headache or visual disturbances Hives Persistent dizziness or light-headedness Extreme fatigue Any other questions or concerns you may have after discharge  In an emergency, call 911 or go to an Emergency Department at a nearby  hospital  Follow- Up Appointment:  Please call for an appointment to be seen approximately 2-3 week after surgery in [facility] with your surgeon Dr. Cordella Rhein - [phone] Address: [address], [place], [place] [address]

## 2023-12-16 NOTE — Anesthesia Postprocedure Evaluation (Signed)
 Anesthesia Post Note  Patient: Tricia [name]  Procedure(s) Performed: HEMIARTHROPLASTY (BIPOLAR) HIP, POSTERIOR APPROACH FOR FRACTURE (Right)     Patient location during evaluation: PACU Anesthesia Type: Spinal Level of consciousness: awake and alert Pain management: pain level controlled Vital Signs Assessment: post-procedure vital signs reviewed and stable Respiratory status: spontaneous breathing and respiratory function stable Cardiovascular status: blood pressure returned to baseline and stable Postop Assessment: spinal receding and no apparent nausea or vomiting Anesthetic complications: no   No notable events documented.  Last Vitals:  Vitals:   12/16/23 0022 12/16/23 0400  BP: 124/63 (!) 120/56  Pulse: 64 64  Resp: 18 18  Temp: 36.8 C 36.8 C  SpO2: 98% 98%    Last Pain:  Vitals:   12/16/23 0551  TempSrc:   PainSc: 6                  Debby FORBES Like

## 2023-12-16 NOTE — Discharge Summary (Signed)
 Physician Discharge Summary  Patient ID: Tricia [name] MRN: [id number] DOB/AGE: 78-May-1947 78 y.o.  Admit date: 12/14/2023 Discharge date: 12/16/2023  Admission Diagnoses:  Femur fracture, right [facility])  Discharge Diagnoses:  Principal Problem:   Femur fracture, right [facility])   Past Medical History:  Diagnosis Date   Proximal humerus fracture 01/20/2019   Tachycardia    Related to Epinephrine used for dental procedure    Surgeries: Procedure(s): HEMIARTHROPLASTY (BIPOLAR) HIP, POSTERIOR APPROACH FOR FRACTURE on 12/15/2023   Consultants (if any):   Discharged Condition: Improved  Hospital Course: Tricia [name] is an 78 y.o. female who was admitted 12/14/2023 with a diagnosis of Femur fracture, right (HCC) and went to the operating room on 12/15/2023 and underwent the above named procedures.    She was given perioperative antibiotics:  Anti-infectives (From admission, onward)    Start     Dose/Rate Route Frequency Ordered Stop   12/16/23 0000  ceFAZolin  (ANCEF ) IVPB 2g/100 mL premix        2 g 200 mL/hr over 30 Minutes Intravenous Every 6 hours 12/15/23 1948 12/16/23 0625   12/15/23 1810  vancomycin  (VANCOCIN ) powder  Status:  Discontinued          As needed 12/15/23 1811 12/15/23 1838   12/15/23 0600  ceFAZolin  (ANCEF ) IVPB 2g/100 mL premix        2 g 200 mL/hr over 30 Minutes Intravenous On call to O.R. 12/14/23 2234 12/15/23 1714     .  She was given sequential compression devices, early ambulation, and aspirin  for DVT prophylaxis.  She benefited maximally from the hospital stay and there were no complications.    Recent vital signs:  Vitals:   12/16/23 0400 12/16/23 0821  BP: (!) 120/56 (!) 135/55  Pulse: 64 68  Resp: 18 17  Temp: 98.2 F (36.8 C) 97.8 F (36.6 C)  SpO2: 98% 100%    Recent laboratory studies:  Lab Results  Component Value Date   HGB 9.7 (L) 12/16/2023   HGB 11.2 (L) 12/14/2023   HGB 12.9 06/26/2021   Lab Results  Component  Value Date   WBC 4.5 12/16/2023   PLT 121 (L) 12/16/2023   Lab Results  Component Value Date   INR 0.9 12/14/2023   Lab Results  Component Value Date   NA 138 12/16/2023   K 3.9 12/16/2023   CL 105 12/16/2023   CO2 21 (L) 12/16/2023   BUN 11 12/16/2023   CREATININE 0.74 12/16/2023   GLUCOSE 110 (H) 12/16/2023    Discharge Medications:     Diagnostic Studies: DG Pelvis Portable Result Date: 12/15/2023 CLINICAL DATA:  Postop arthroplasty. EXAM: PORTABLE PELVIS 1-2 VIEWS COMPARISON:  Preoperative imaging FINDINGS: Right hip arthroplasty in expected alignment. No periprosthetic lucency or fracture. Recent postsurgical change includes air and edema in the soft tissues. Overlying skin staples in place. IMPRESSION: Right hip arthroplasty without immediate postoperative complication. Electronically Signed   By: Andrea Gasman M.D.   On: 12/15/2023 19:26   DG Hip Unilat W or Wo Pelvis 2-3 Views Right Result Date: 12/14/2023 EXAM: 2 or more VIEW(S) XRAY OF THE RIGHT HIP 12/14/2023 11:22:00 AM COMPARISON: Pelvis and right hip radiographs 06/25/2015. CLINICAL HISTORY: Fall. Triage note: Mechanical fall on Friday. C/o R hip pain. Ambulating with limp. Denies thinners or hitting head. FINDINGS: BONES AND JOINTS: Mild foreshortening of the right femoral neck consistent with acute impacted right subcapital femoral neck fracture. SOFT TISSUES: The soft tissues are unremarkable. IMPRESSION: 1. Acute impacted  right subcapital femoral neck fracture with mild foreshortening. Electronically signed by: Lonni Necessary MD 12/14/2023 11:33 AM EDT RP Workstation: [id number]    Disposition:          Discharge Instructions      Orthopedic Discharge Instructions  Diet: As you were doing prior to hospitalization   Shower:  May shower but keep the wounds dry, use an occlusive plastic wrap, NO SOAKING IN TUB.  If the bandage gets wet, change with a clean dry gauze.  If you have a splint on, leave  the splint in place and keep the splint dry with a plastic bag.  Dressing:  You may change your dressing 3-5 days after surgery, unless you have a splint.  If the dressing remains clean and dry it can also be left on until follow up. If you change the dressing replace with clean gauze and tape or ace wrap. If you have a splint, then just leave the splint in place and we will change your bandages during your first follow-up appointment.  If water  gets in the splint or the splint gets saturated please call the clinic and we can see you to change your splint.  If you had hand or foot surgery, we will plan to remove your stitches in about 2 weeks in the office.  For all other surgeries, there are sticky tapes (steri-strips) on your wounds and all the stitches are absorbable.  Leave the steri-strips in place when changing your dressings, they will peel off with time, usually 2-3 weeks.  Activity:  Increase activity slowly as tolerated, but follow the weight bearing instructions below.  The rules on driving is that you can not be taking narcotics while you drive, and you must feel in control of the vehicle.    Weight Bearing:   As tolerated with posterior hip precautions.    Blood clot prevention (DVT Prophylaxis): After surgery you are at an increased risk for a blood clot. you were prescribed a blood thinner, Aspirin  325mg , to be taken once daily for a total of 4 weeks from surgery to help reduce your risk of getting a blood clot. This will help prevent a blood clot. Signs of a pulmonary embolus (blood clot in the lungs) include sudden short of breath, feeling lightheaded or dizzy, chest pain with a deep breath, rapid pulse rapid breathing. Signs of a blood clot in your arms or legs include new unexplained swelling and cramping, warm, red or darkened skin around the painful area. Please call the office or 911 right away if these signs or symptoms develop. To prevent constipation: you may use a stool softener  such as -  Colace (over the counter) 100 mg by mouth twice a day  Drink plenty of fluids (prune juice may be helpful) and high fiber foods Miralax  (over the counter) for constipation as needed.    Itching:  If you experience itching with your medications, try taking only a single pain pill, or even half a pain pill at a time.  You may take up to 10 pain pills per day, and you can also use benadryl  over the counter for itching or also to help with sleep.   Precautions:  If you experience chest pain or shortness of breath - call 911 immediately for transfer to the hospital emergency department!!   Call office [phone]) for the following: Temperature greater than 101F Persistent nausea and vomiting Severe uncontrolled pain Redness, tenderness, or signs of infection (pain, swelling, redness, odor or green/yellow  discharge around the site) Difficulty breathing, headache or visual disturbances Hives Persistent dizziness or light-headedness Extreme fatigue Any other questions or concerns you may have after discharge  In an emergency, call 911 or go to an Emergency Department at a nearby hospital  Follow- Up Appointment:  Please call for an appointment to be seen approximately 2-3 week after surgery in [facility] with your surgeon Dr. Cordella Rhein - [phone] Address: [address], [place], [place] [address]      Signed: Cordella SHAUNNA Rhein 12/16/2023, 9:18 AM

## 2023-12-16 NOTE — Progress Notes (Signed)
 Orthopaedic Progress Note  S: Patient reports no significant discomfort in her right hip.  She is resting comfortably in bed.  O:  Vitals:   12/16/23 0400 12/16/23 0821  BP: (!) 120/56 (!) 135/55  Pulse: 64 68  Resp: 18 17  Temp: 98.2 F (36.8 C) 97.8 F (36.6 C)  SpO2: 98% 100%    Well-appearing woman no acute distress.  Clean dressing on her right thigh.  No significant swelling of the thigh.  No calf tenderness.  Intact sensation in the saphenous sural tibial, and peroneal nerve distributions 5 out of 5 strength EHL FHL gastrocsoleus anterior  Imaging: AP pelvis shows successful position of a right hip hemiarthroplasty.  Labs:  Results for orders placed or performed during the hospital encounter of 12/14/23 (from the past 24 hours)  Type and screen [facility]     Status: None   Collection Time: 12/15/23 12:05 PM  Result Value Ref Range   ABO/RH(D) B NEG    Antibody Screen NEG    Sample Expiration      12/18/2023,2359 Performed at [facility], [address], [place], [place] [address]   CBC     Status: Abnormal   Collection Time: 12/16/23  2:59 AM  Result Value Ref Range   WBC 4.5 4.0 - 10.5 K/uL   RBC 2.95 (L) 3.87 - 5.11 MIL/uL   Hemoglobin 9.7 (L) 12.0 - 15.0 g/dL   HCT 72.5 (L) 63.9 - 53.9 %   MCV 92.9 80.0 - 100.0 fL   MCH 32.9 26.0 - 34.0 pg   MCHC 35.4 30.0 - 36.0 g/dL   RDW 88.3 88.4 - 84.4 %   Platelets 121 (L) 150 - 400 K/uL   nRBC 0.0 0.0 - 0.2 %  Basic metabolic panel     Status: Abnormal   Collection Time: 12/16/23  2:59 AM  Result Value Ref Range   Sodium 138 135 - 145 mmol/L   Potassium 3.9 3.5 - 5.1 mmol/L   Chloride 105 98 - 111 mmol/L   CO2 21 (L) 22 - 32 mmol/L   Glucose, Bld 110 (H) 70 - 99 mg/dL   BUN 11 8 - 23 mg/dL   Creatinine, Ser 9.25 0.44 - 1.00 mg/dL   Calcium 8.5 (L) 8.9 - 10.3 mg/dL   GFR, Estimated >39 >39 mL/min   Anion gap 12 5 - 15    Assessment: Postop day 1 status post right hip  hemiarthroplasty  Ms. Harcum is doing well after surgery.  Her pain is tolerable.  We will mobilize with physical therapy today.  She will observe posterior hip precautions.  She will continue with the hip abduction pillow while in bed.  Continue the octyl dressing for 7 days.  Will start aspirin  for DVT prophylaxis.  Continue his current pain regimen.  Continue the current bowel regimen.  Anticipate discharge in the coming days manage progress with therapy  Injuries: Right femoral neck fracture status post right hip hemiarthroplasty  Weightbearing: Weightbearing as tolerated with posterior hip precautions  Insicional and dressing care: Aquacel dressing      Pain management: Continue with hydrocodone   VTE prophylaxis: Aspirin    Dispo: Home versus rehab depending on how she progresses with therapy  Follow - up plan: Will follow with the patient daily   Cordella Rhein, MD, MS [facility] [phone]

## 2023-12-16 NOTE — Progress Notes (Signed)
 Orthopedic Tech Progress Note Patient Details:  Tricia [name] 1945-12-28 [id number]  Patient has on HIP ABDUCTION PILLOW   Patient ID: Evlyn Rudi Morrow, female   DOB: Sep 26, 1945, 78 y.o.   MRN: [id number]  Delanna LITTIE Pac 12/16/2023, 10:04 AM

## 2023-12-16 NOTE — Evaluation (Signed)
 Occupational Therapy Evaluation Patient Details Name: Tricia [name] MRN: [id number] DOB: 12/28/1945 Today's Date: 12/16/2023   History of Present Illness   78 y.o. female presents to [facility] 12/14/23 after falling. Pt with R femoral neck fx. 9/2 R hemiarthroplasty. PMHx: tachycardia     Clinical Impressions Pt c/o pain to R hip, 7/10, on BSC with NT upon entry. Pt in town for a few months staying with son in home with 4 STE, one level house, PLOF mod I, lives with husband in [place]  and helps take care of him. Pt reports falling in [place]  for a wedding and ambulating for a couple days after with RW before coming to hospital. Pt currently min A for transfers and ambulation for short distances, difficulty putting full weight on RLE, reliant on RW for support. Pt max A for LB ADLs. Pt able to complete UB ADLs with set up sitting. Pt overall A/O, although some apparent STM deficits during session with repeated information or difficulty remembering stated info during session, mild decreased problem solving. Pt would benefit from postacute rehab <3hrs/day to maximize functional independence prior to returning home as family works during the day. Will continue to follow acutely to progress as able.      If plan is discharge home, recommend the following:   A little help with walking and/or transfers;A lot of help with bathing/dressing/bathroom;Assistance with cooking/housework;Assist for transportation;Help with stairs or ramp for entrance     Functional Status Assessment   Patient has had a recent decline in their functional status and demonstrates the ability to make significant improvements in function in a reasonable and predictable amount of time.     Equipment Recommendations   Other (comment) (defer)     Recommendations for Other Services         Precautions/Restrictions   Precautions Precautions: Posterior Hip Precaution Booklet Issued: Yes (comment) Recall of  Precautions/Restrictions: Intact Precaution/Restrictions Comments: hip ABD brace in bed Restrictions Weight Bearing Restrictions Per Provider Order: Yes RLE Weight Bearing Per Provider Order: Weight bearing as tolerated     Mobility Bed Mobility Overal bed mobility: Needs Assistance Bed Mobility: Sit to Supine       Sit to supine: Mod assist   General bed mobility comments: mod A for BLEs    Transfers Overall transfer level: Needs assistance Equipment used: Rolling walker (2 wheels) Transfers: Sit to/from Stand, Bed to chair/wheelchair/BSC Sit to Stand: Min assist     Step pivot transfers: Min assist     General transfer comment: min A with RW      Balance Overall balance assessment: Needs assistance Sitting-balance support: No upper extremity supported, Feet supported Sitting balance-Leahy Scale: Fair     Standing balance support: Bilateral upper extremity supported, During functional activity, Reliant on assistive device for balance Standing balance-Leahy Scale: Poor Standing balance comment: reliant on RW for support                           ADL either performed or assessed with clinical judgement   ADL Overall ADL's : Needs assistance/impaired Eating/Feeding: Independent   Grooming: Set up;Sitting   Upper Body Bathing: Set up;Sitting   Lower Body Bathing: Maximal assistance;Sitting/lateral leans   Upper Body Dressing : Set up;Sitting   Lower Body Dressing: Maximal assistance;Sitting/lateral leans   Toilet Transfer: Minimal assistance;Stand-pivot;Rolling walker (2 wheels);BSC/3in1           Functional mobility during ADLs: Minimal assistance;Rolling walker (2 wheels) General  ADL Comments: Pt does well with UB ADLs sitting, will need max A for LB ADLs, was able to stand pivot from [facility] to bed min A using RW for support.     Vision Baseline Vision/History: 1 Wears glasses Ability to See in Adequate Light: 0 Adequate Patient Visual  Report: No change from baseline       Perception         Praxis         Pertinent Vitals/Pain Pain Assessment Pain Assessment: 0-10 Pain Score: 7  Pain Location: R hip Pain Descriptors / Indicators: Aching, Discomfort, Grimacing Pain Intervention(s): Monitored during session     Extremity/Trunk Assessment Upper Extremity Assessment Upper Extremity Assessment: Overall WFL for tasks assessed           Communication Communication Communication: No apparent difficulties   Cognition Arousal: Alert Behavior During Therapy: WFL for tasks assessed/performed Cognition: No family/caregiver present to determine baseline             OT - Cognition Comments: Difficult to assess,  grossly oriented but apparent short term memory problems and decreased problem solving                 Following commands: Intact       Cueing  General Comments   Cueing Techniques: Verbal cues      Exercises     Shoulder Instructions      Home Living Family/patient expects to be discharged to:: Private residence Living Arrangements: Children (son) Available Help at Discharge: Family;Available PRN/intermittently Type of Home: House Home Access: Stairs to enter [employer] of Steps: 5 Entrance Stairs-Rails: Right;Left;Can reach both Home Layout: One level     Bathroom Shower/Tub: Chief Strategy Officer: Standard     Home Equipment: Tub bench;Rolling Environmental consultant (2 wheels);Cane - single point;Crutches   Additional Comments: Pt lives with son and daughter in law, they work during the day. Pt reports she also lives in [place]  with her husband who has memory problems. Staying here for a wedding.      Prior Functioning/Environment Prior Level of Function : Independent/Modified Independent             Mobility Comments: reports ind ADLs Comments: reports ind    OT Problem List: Decreased strength;Decreased range of motion;Decreased activity  tolerance;Impaired balance (sitting and/or standing);Decreased cognition;Pain   OT Treatment/Interventions: Self-care/ADL training;Therapeutic exercise;Energy conservation;DME and/or AE instruction;Therapeutic activities;Patient/family education;Balance training      OT Goals(Current goals can be found in the care plan section)   Acute Rehab OT Goals Patient Stated Goal: to manage pain OT Goal Formulation: With patient/family Time For Goal Achievement: 12/30/23 Potential to Achieve Goals: Good   OT Frequency:  Min 2X/week    Co-evaluation              AM-PAC OT 6 Clicks Daily Activity     Outcome Measure Help from another person eating meals?: None Help from another person taking care of personal grooming?: A Little Help from another person toileting, which includes using toliet, bedpan, or urinal?: A Little Help from another person bathing (including washing, rinsing, drying)?: A Lot Help from another person to put on and taking off regular upper body clothing?: A Little Help from another person to put on and taking off regular lower body clothing?: A Lot 6 Click Score: 17   End of Session Equipment Utilized During Treatment: Gait belt;Rolling walker (2 wheels) Nurse Communication: Mobility status  Activity Tolerance: Patient tolerated treatment well  Patient left: in bed;with call bell/phone within reach  OT Visit Diagnosis: Unsteadiness on feet (R26.81);Other abnormalities of gait and mobility (R26.89);Muscle weakness (generalized) (M62.81);Pain Pain - Right/Left: Right Pain - part of body: Hip                Time: 1200-1230 OT Time Calculation (min): 30 min Charges:  OT General Charges $OT Visit: 1 Visit OT Evaluation $OT Eval Moderate Complexity: 1 Mod OT Treatments $Self Care/Home Management : 8-22 mins  Seth Ward, OTR/L   Tricia [name] 12/16/2023, 12:40 PM

## 2023-12-16 NOTE — Transfer of Care (Signed)
 Immediate Anesthesia Transfer of Care Note  Patient: Tricia [name]  Procedure(s) Performed: HEMIARTHROPLASTY (BIPOLAR) HIP, POSTERIOR APPROACH FOR FRACTURE (Right)  Patient Location: PACU  Anesthesia Type:Spinal  Level of Consciousness: awake, alert , and oriented  Airway & Oxygen Therapy: Patient Spontanous Breathing and Patient connected to nasal cannula oxygen  Post-op Assessment: Report given to RN, Post -op Vital signs reviewed and stable, and Patient able to stick tongue midline  Post vital signs: stable  Last Vitals:  Vitals Value Taken Time  BP 120/56 12/16/23 04:00  Temp 36.8 C 12/16/23 04:00  Pulse 64 12/16/23 04:00  Resp 18 12/16/23 04:00  SpO2 98 % 12/16/23 04:00    Last Pain:  Vitals:   12/16/23 0551  TempSrc:   PainSc: 6          Complications: No notable events documented.

## 2023-12-16 NOTE — Plan of Care (Signed)
  Problem: Pain Managment: Goal: General experience of comfort will improve and/or be controlled Outcome: Progressing   Problem: Safety: Goal: Ability to remain free from injury will improve Outcome: Progressing   Problem: Skin Integrity: Goal: Risk for impaired skin integrity will decrease Outcome: Progressing

## 2023-12-17 LAB — CBC
HCT: 27.8 % — ABNORMAL LOW (ref 36.0–46.0)
Hemoglobin: 9.9 g/dL — ABNORMAL LOW (ref 12.0–15.0)
MCH: 32.9 pg (ref 26.0–34.0)
MCHC: 35.6 g/dL (ref 30.0–36.0)
MCV: 92.4 fL (ref 80.0–100.0)
Platelets: 136 K/uL — ABNORMAL LOW (ref 150–400)
RBC: 3.01 MIL/uL — ABNORMAL LOW (ref 3.87–5.11)
RDW: 11.5 % (ref 11.5–15.5)
WBC: 5.6 K/uL (ref 4.0–10.5)
nRBC: 0 % (ref 0.0–0.2)

## 2023-12-17 LAB — URINALYSIS, ROUTINE W REFLEX MICROSCOPIC
Bacteria, UA: NONE SEEN
Bilirubin Urine: NEGATIVE
Glucose, UA: NEGATIVE mg/dL
Hgb urine dipstick: NEGATIVE
Ketones, ur: NEGATIVE mg/dL
Leukocytes,Ua: NEGATIVE
Nitrite: NEGATIVE
Protein, ur: NEGATIVE mg/dL
Specific Gravity, Urine: 1.009 (ref 1.005–1.030)
pH: 7 (ref 5.0–8.0)

## 2023-12-17 NOTE — Plan of Care (Signed)
 Patient has confusion throughout the shift but able to redirect. Patient had sat on the chair, has complaints of pain but tolerable. Visitors at bedside. Problem: Education: Goal: Knowledge of General Education information will improve Description: Including pain rating scale, medication(s)/side effects and non-pharmacologic comfort measures Outcome: Progressing   Problem: Health Behavior/Discharge Planning: Goal: Ability to manage health-related needs will improve Outcome: Progressing   Problem: Clinical Measurements: Goal: Ability to maintain clinical measurements within normal limits will improve Outcome: Progressing Goal: Will remain free from infection Outcome: Progressing Goal: Diagnostic test results will improve Outcome: Progressing Goal: Respiratory complications will improve Outcome: Progressing Goal: Cardiovascular complication will be avoided Outcome: Progressing   Problem: Activity: Goal: Risk for activity intolerance will decrease Outcome: Progressing   Problem: Nutrition: Goal: Adequate nutrition will be maintained Outcome: Progressing   Problem: Coping: Goal: Level of anxiety will decrease Outcome: Progressing   Problem: Elimination: Goal: Will not experience complications related to bowel motility Outcome: Progressing Goal: Will not experience complications related to urinary retention Outcome: Progressing   Problem: Pain Managment: Goal: General experience of comfort will improve and/or be controlled Outcome: Progressing   Problem: Safety: Goal: Ability to remain free from injury will improve Outcome: Progressing   Problem: Skin Integrity: Goal: Risk for impaired skin integrity will decrease Outcome: Progressing   Problem: Education: Goal: Knowledge of the prescribed therapeutic regimen will improve Outcome: Progressing Goal: Understanding of discharge needs will improve Outcome: Progressing Goal: Individualized Educational Video(s) Outcome:  Progressing   Problem: Activity: Goal: Ability to avoid complications of mobility impairment will improve Outcome: Progressing Goal: Ability to tolerate increased activity will improve Outcome: Progressing   Problem: Clinical Measurements: Goal: Postoperative complications will be avoided or minimized Outcome: Progressing   Problem: Pain Management: Goal: Pain level will decrease with appropriate interventions Outcome: Progressing   Problem: Skin Integrity: Goal: Will show signs of wound healing Outcome: Progressing

## 2023-12-17 NOTE — TOC Initial Note (Signed)
 Transition of Care [facility]) - Initial/Assessment Note    Patient Details  Name: Tricia [name] MRN: [id number] Date of Birth: [birth year]  Transition of [facility]) CM/SW Contact:    Tricia [name], Tricia [name] Phone Number: 12/17/2023, 1:55 PM  Clinical Narrative:                 Pt admitted from home due to mechanical fall. CSW completed SNF workup with pt- she is agreeable to DC plan and would prefer [facility] as her first facility choice and does not want to go to [facility]. CSW completed Fl2 and sent out SNF referrals. CSW will follow up and provide bed offers.  Expected Discharge Plan: Skilled Nursing Facility Barriers to Discharge: Continued Medical Work up, English as a second language teacher, SNF Pending bed offer   Patient Goals and CMS Choice Patient states their goals for this hospitalization and ongoing recovery are:: SNF          Expected Discharge Plan and Services       Living arrangements for the past 2 months: Single Family Home                                      Prior Living Arrangements/Services Living arrangements for the past 2 months: Single Family Home Lives with:: Self Patient language and need for interpreter reviewed:: Yes Do you feel safe going back to the place where you live?: Yes      Need for Family Participation in Patient Care: Yes (Comment) Care giver support system in place?: Yes (comment)   Criminal Activity/Legal Involvement Pertinent to Current Situation/Hospitalization: No - Comment as needed  Activities of Daily Living   ADL Screening (condition at time of admission) Independently performs ADLs?: Yes (appropriate for developmental age) Is the patient deaf or have difficulty hearing?: No Does the patient have difficulty seeing, even when wearing glasses/contacts?: No Does the patient have difficulty concentrating, remembering, or making decisions?: No  Permission Sought/Granted Permission sought to share information  with : Facility Medical sales representative, Family Supports    Share Information with NAME: Tricia [name]  Permission granted to share info w AGENCY: SNFs  Permission granted to share info w Relationship: Son  Permission granted to share info w Contact Information: [phone]  Emotional Assessment Appearance:: Appears stated age Attitude/Demeanor/Rapport: Engaged Affect (typically observed): Accepting Orientation: : Oriented to Self, Oriented to Place, Oriented to  Time, Oriented to Situation Alcohol / Substance Use: Not Applicable Psych Involvement: No (comment)  Admission diagnosis:  Femur fracture, right (HCC) [S72.91XA] Closed fracture of neck of right femur, initial encounter (HCC) [S72.001A] Patient Active Problem List   Diagnosis Date Noted   Femur fracture, right (HCC) 12/14/2023   BMI less than 19,adult 08/31/2020   Mild protein-calorie malnutrition (HCC) 08/31/2020   FHx: heart disease 12/10/2017   Hyperlipidemia, mixed 12/10/2017   Labile hypertension 12/10/2017   Osteoporosis 05/10/2017   Vitamin D  deficiency 10/31/2015   PCP:  System, Provider Not In Pharmacy:   [facility] - [place], [place] - [address] [place] [address] Phone: [phone] Fax: [phone]     Social Drivers of Health (SDOH) Social History: SDOH Screenings   Food Insecurity: No Food Insecurity (12/14/2023)  Housing: Low Risk  (12/14/2023)  Transportation Needs: No Transportation Needs (12/14/2023)  Utilities: Not At Risk (12/14/2023)  Depression (PHQ2-9): Low Risk  (10/29/2020)  Social  Connections: Moderately Integrated (12/14/2023)  Tobacco Use: Medium Risk (12/15/2023)   SDOH Interventions:     Readmission Risk Interventions     No data to display

## 2023-12-17 NOTE — Plan of Care (Signed)
  Problem: Pain Managment: Goal: General experience of comfort will improve and/or be controlled Outcome: Progressing   Problem: Safety: Goal: Ability to remain free from injury will improve Outcome: Progressing

## 2023-12-17 NOTE — NC FL2 (Signed)
 [place]  MEDICAID FL2 LEVEL OF CARE FORM     IDENTIFICATION  Patient Name: Tricia [name] Birthdate: [birth year] Sex: female Admission Date (Current Location): 12/14/2023  [facility] and [place] Number:  Producer, television/film/video and Address:  [facility] [place], [address], [place], [place] [address]      Provider Number: [id number]  Attending Physician Name and Address:  Reyne Cordella SQUIBB, MD  Relative Name and Phone Number:       Current Level of Care: Hospital Recommended Level of Care: Skilled Nursing Facility Prior Approval Number:    Date Approved/Denied:   PASRR Number: [id number]  Discharge Plan: SNF    Current Diagnoses: Patient Active Problem List   Diagnosis Date Noted   Femur fracture, right (HCC) 12/14/2023   BMI less than 19,adult 08/31/2020   Mild protein-calorie malnutrition (HCC) 08/31/2020   FHx: heart disease 12/10/2017   Hyperlipidemia, mixed 12/10/2017   Labile hypertension 12/10/2017   Osteoporosis 05/10/2017   Vitamin D  deficiency 10/31/2015    Orientation RESPIRATION BLADDER Height & Weight     Self, Time, Situation, Place  Normal Incontinent Weight: 92 lb 13 oz (42.1 kg) Height:  5' 2 (157.5 cm)  BEHAVIORAL SYMPTOMS/MOOD NEUROLOGICAL BOWEL NUTRITION STATUS      Continent Diet (See DC Summary)  AMBULATORY STATUS COMMUNICATION OF NEEDS Skin   Extensive Assist Verbally Other (Comment) (wound on left pretibial; closed surgical incision on right hip)                       Personal Care Assistance Level of Assistance  Bathing, Dressing, Feeding Bathing Assistance: Maximum assistance Feeding assistance: Limited assistance Dressing Assistance: Maximum assistance     Functional Limitations Info  Sight, Speech, Hearing Sight Info: Impaired Hearing Info: Adequate Speech Info: Adequate    SPECIAL CARE FACTORS FREQUENCY  PT (By licensed PT), OT (By licensed OT)     PT Frequency: 5x/week OT Frequency:  5x/week            Contractures Contractures Info: Not present    Additional Factors Info  Allergies, Code Status Code Status Info: Full Allergies Info: Codeine; Gluten Meal; Milk-related compounds; sulfa antibiotics; iodinated contrast media; iodine            Current Medications (12/17/2023):  This is the current hospital active medication list Current Facility-Administered Medications  Medication Dose Route Frequency Provider Last Rate Last Admin   acetaminophen  (TYLENOL ) tablet 325-650 mg  325-650 mg Oral Q6H PRN Gebauer, Gregory P, MD       aspirin  EC tablet 325 mg  325 mg Oral Q breakfast Reyne Cordella SQUIBB, MD   325 mg at 12/17/23 0857   docusate sodium  (COLACE) capsule 100 mg  100 mg Oral BID Reyne Cordella SQUIBB, MD   100 mg at 12/17/23 9142   fentaNYL  (SUBLIMAZE ) injection 25 mcg  25 mcg Intravenous Q1H PRN Jerrol Agent, MD       HYDROcodone -acetaminophen  (NORCO) 7.5-325 MG per tablet 1-2 tablet  1-2 tablet Oral Q4H PRN Gebauer, Gregory P, MD   1 tablet at 12/16/23 0551   HYDROcodone -acetaminophen  (NORCO/VICODIN) 5-325 MG per tablet 1-2 tablet  1-2 tablet Oral Q4H PRN Reyne Cordella SQUIBB, MD   1 tablet at 12/16/23 1504   lactated ringers  infusion   Intravenous Continuous Reyne Cordella SQUIBB, MD 75 mL/hr at 12/16/23 1411 New Bag at 12/16/23 1411   menthol -cetylpyridinium (CEPACOL) lozenge 3 mg  1 lozenge Oral PRN Reyne Cordella SQUIBB, MD  Or   phenol (CHLORASEPTIC) mouth spray 1 spray  1 spray Mouth/Throat PRN Reyne Cordella SQUIBB, MD       morphine  (PF) 2 MG/ML injection 0.5-1 mg  0.5-1 mg Intravenous Q2H PRN Reyne Cordella SQUIBB, MD       ondansetron  (ZOFRAN ) tablet 4 mg  4 mg Oral Q6H PRN Reyne Cordella SQUIBB, MD       Or   ondansetron  (ZOFRAN ) injection 4 mg  4 mg Intravenous Q6H PRN Reyne Cordella SQUIBB, MD       pantoprazole  (PROTONIX ) EC tablet 40 mg  40 mg Oral Daily Gebauer, Gregory P, MD   40 mg at 12/17/23 0857   polyethylene glycol (MIRALAX  / GLYCOLAX ) packet 17 g  17 g  Oral Daily PRN Gebauer, Gregory P, MD       senna (SENOKOT) tablet 8.6 mg  1 tablet Oral BID Reyne Cordella SQUIBB, MD   8.6 mg at 12/17/23 9142     Discharge Medications: Please see discharge summary for a list of discharge medications.  Relevant Imaging Results:  Relevant Lab Results:   Additional Information SSN:[id number]  Jeoffrey LITTIE Moose, LCSWA

## 2023-12-17 NOTE — Care Management Important Message (Signed)
 Important Message  Patient Details  Name: Tricia [name] MRN: [id number] Date of Birth: 1945/12/26   Important Message Given:  Yes - Medicare IM     Jon Cruel 12/17/2023, 2:12 PM

## 2023-12-17 NOTE — Progress Notes (Signed)
 Orthopaedic Progress Note  S: The patient is resting comfortably in bed.  She denies any significant complaints.  Apparently yesterday she did have some degree of confusion according to her facility nursing staff.  Currently she is alert and appears oriented  O:  Vitals:   12/16/23 1543 12/17/23 0500  BP: 135/65 135/68  Pulse: 80 82  Resp: 16 18  Temp: 98.4 F (36.9 C) 99.2 F (37.3 C)  SpO2: 100% 97%    Clean surgical incision.  Calf is not swollen.  No calf tenderness.  Thigh is soft and compressible.  Intact sensation in the saphenous sural tibial peroneal distributions.  5 5 strength EHL FHL gastroc    Labs:  Results for orders placed or performed during the hospital encounter of 12/14/23 (from the past 24 hours)  CBC with Differential/Platelet     Status: Abnormal   Collection Time: 12/16/23  8:20 PM  Result Value Ref Range   WBC 5.2 4.0 - 10.5 K/uL   RBC 2.99 (L) 3.87 - 5.11 MIL/uL   Hemoglobin 10.0 (L) 12.0 - 15.0 g/dL   HCT 72.2 (L) 63.9 - 53.9 %   MCV 92.6 80.0 - 100.0 fL   MCH 33.4 26.0 - 34.0 pg   MCHC 36.1 (H) 30.0 - 36.0 g/dL   RDW 88.4 88.4 - 84.4 %   Platelets 126 (L) 150 - 400 K/uL   nRBC 0.0 0.0 - 0.2 %   Neutrophils Relative % 81 %   Neutro Abs 4.3 1.7 - 7.7 K/uL   Lymphocytes Relative 6 %   Lymphs Abs 0.3 (L) 0.7 - 4.0 K/uL   Monocytes Relative 12 %   Monocytes Absolute 0.6 0.1 - 1.0 K/uL   Eosinophils Relative 0 %   Eosinophils Absolute 0.0 0.0 - 0.5 K/uL   Basophils Relative 0 %   Basophils Absolute 0.0 0.0 - 0.1 K/uL   Immature Granulocytes 1 %   Abs Immature Granulocytes 0.03 0.00 - 0.07 K/uL  Comprehensive metabolic panel     Status: Abnormal   Collection Time: 12/16/23  8:20 PM  Result Value Ref Range   Sodium 138 135 - 145 mmol/L   Potassium 4.0 3.5 - 5.1 mmol/L   Chloride 105 98 - 111 mmol/L   CO2 22 22 - 32 mmol/L   Glucose, Bld 202 (H) 70 - 99 mg/dL   BUN 9 8 - 23 mg/dL   Creatinine, Ser 9.19 0.44 - 1.00 mg/dL   Calcium 8.7 (L) 8.9  - 10.3 mg/dL   Total Protein 5.3 (L) 6.5 - 8.1 g/dL   Albumin 3.0 (L) 3.5 - 5.0 g/dL   AST 39 15 - 41 U/L   ALT 18 0 - 44 U/L   Alkaline Phosphatase 71 38 - 126 U/L   Total Bilirubin 1.3 (H) 0.0 - 1.2 mg/dL   GFR, Estimated >39 >39 mL/min   Anion gap 11 5 - 15  CBC     Status: Abnormal   Collection Time: 12/17/23  2:58 AM  Result Value Ref Range   WBC 5.6 4.0 - 10.5 K/uL   RBC 3.01 (L) 3.87 - 5.11 MIL/uL   Hemoglobin 9.9 (L) 12.0 - 15.0 g/dL   HCT 72.1 (L) 63.9 - 53.9 %   MCV 92.4 80.0 - 100.0 fL   MCH 32.9 26.0 - 34.0 pg   MCHC 35.6 30.0 - 36.0 g/dL   RDW 88.4 88.4 - 84.4 %   Platelets 136 (L) 150 - 400 K/uL   nRBC  0.0 0.0 - 0.2 %  Urinalysis, Routine w reflex microscopic -Urine, Clean Catch     Status: None   Collection Time: 12/17/23  4:18 AM  Result Value Ref Range   Color, Urine YELLOW YELLOW   APPearance CLEAR CLEAR   Specific Gravity, Urine 1.009 1.005 - 1.030   pH 7.0 5.0 - 8.0   Glucose, UA NEGATIVE NEGATIVE mg/dL   Hgb urine dipstick NEGATIVE NEGATIVE   Bilirubin Urine NEGATIVE NEGATIVE   Ketones, ur NEGATIVE NEGATIVE mg/dL   Protein, ur NEGATIVE NEGATIVE mg/dL   Nitrite NEGATIVE NEGATIVE   Leukocytes,Ua NEGATIVE NEGATIVE   RBC / HPF 0-5 0 - 5 RBC/hpf   WBC, UA 0-5 0 - 5 WBC/hpf   Bacteria, UA NONE SEEN NONE SEEN   Squamous Epithelial / HPF 0-5 0 - 5 /HPF    Assessment: Postop day 2 status post right hip hemiarthroplasty   Tricia [name] is doing well after surgery.  Her pain is tolerable.  The biggest concern seems to be some slight confusion.  Her UA was negative and this is most likely reactionary to surgery, pain medications, and being in the hospital.  Will continue observe this.  We will mobilize with physical therapy today.  She will observe posterior hip precautions.  She will continue with the hip abduction pillow while in bed.  Continue the aquacel dressing for 7 days.  Will continue aspirin  for DVT prophylaxis.  Continue his current pain regimen.   Continue the current bowel regimen.  Anticipate discharge i to subacute rehab per recommendations from therapy   Injuries: Right femoral neck fracture status post right hip hemiarthroplasty             Weightbearing: Weightbearing as tolerated with posterior hip precautions             Insicional and dressing care: Aquacel dressing                    Pain management: Continue with hydrocodone    VTE prophylaxis: Aspirin      Dispo: Subacute therapy  Follow - up plan: Will follow with the patient daily   Tricia Rhein, MD, MS [name] Specialist [phone]

## 2023-12-17 NOTE — Progress Notes (Signed)
 Mobility Specialist Progress Note:    12/17/23 1201  Mobility  Activity Ambulated with assistance (To BR/ in hallway)  Level of Assistance Minimal assist, patient does 75% or more  Assistive Device Front wheel walker  Distance Ambulated (ft) 45 ft  RLE Weight Bearing Per Provider Order WBAT  Activity Response Tolerated well  Mobility Referral Yes  Mobility visit 1 Mobility  Mobility Specialist Start Time (ACUTE ONLY) 1125  Mobility Specialist Stop Time (ACUTE ONLY) 1155  Mobility Specialist Time Calculation (min) (ACUTE ONLY) 30 min   Received pt in bed and agreeable to mobility. Required Mina to EOB and contact guard during STS. Pt requested to use the BR prior to ambulating in hallway. Pt c/o RLE pain, otherwise tolerated well. Returned to room without fault. Left pt in bed. Personal belongings and call light within reach. All needs met.   Lavanda Pollack Mobility Specialist  Please contact via [employer] or  Rehab Office [phone]

## 2023-12-18 NOTE — Progress Notes (Signed)
 Orthopaedic Progress Note  S: The patient reports some discomfort in her hip but is tolerable for her.  She had some confusion yesterday but seems to be improved off the pain medication  O:  Vitals:   12/17/23 1952 12/18/23 0500  BP: (!) 112/49 111/60  Pulse: 87 82  Resp: 18 18  Temp: 99.4 F (37.4 C) 98.6 F (37 C)  SpO2: 98% 97%    soft and compressible.  Intact sensation in the saphenous sural tibial peroneal distributions.  5 5 strength EHL FHL gastroc      Labs: No results found for this or any previous visit (from the past 24 hours).  Assessment: Postop day 3 status post right hip hemiarthroplasty   Tricia [name] is doing well after surgery.  Her pain is tolerable.  Her confusion seems to be improved.  Will continue Tylenol  for pain and continue to observe this.  We will mobilize with physical therapy today.  She will observe posterior hip precautions.  She will continue with the hip abduction pillow while in bed.  Continue the aquacel dressing for 7 days.  Will continue aspirin  for DVT prophylaxis.  Continue his current pain regimen.  Continue the current bowel regimen.  Anticipate discharge i to subacute rehab per recommendations from therapy   Injuries: Right femoral neck fracture status post right hip hemiarthroplasty             Weightbearing: Weightbearing as tolerated with posterior hip precautions             Insicional and dressing care: Aquacel dressing                    Pain management: Continue with hydrocodone    VTE prophylaxis: Aspirin      Dispo: Subacute therapy   Follow - up plan: Will follow with the patient daily    Cordella Rhein, MD, MS [facility]ist [phone]

## 2023-12-18 NOTE — Plan of Care (Signed)
  Problem: Education: Goal: Knowledge of General Education information will improve Description: Including pain rating scale, medication(s)/side effects and non-pharmacologic comfort measures Outcome: Progressing   Problem: Nutrition: Goal: Adequate nutrition will be maintained Outcome: Progressing   Problem: Pain Managment: Goal: General experience of comfort will improve and/or be controlled Outcome: Progressing   Problem: Pain Management: Goal: Pain level will decrease with appropriate interventions Outcome: Progressing

## 2023-12-18 NOTE — Progress Notes (Signed)
 Physical Therapy Treatment Patient Details Name: Tricia [name] MRN: [id number] DOB: 03-13-1946 Today's Date: 12/18/2023   History of Present Illness 78 y.o. female presents to [facility] 12/14/23 after falling. Pt with R femoral neck fx. 9/2 R hemiarthroplasty. PMHx: tachycardia    PT Comments  Pt seen for PT tx with pt agreeable. Pt does not appear aware of posterior hip precautions & PT educates pt on them; pt requires cuing/assist to recall them at end of session. Pt is progressing well with mobility, ambulating to stairs & back with RW & supervision with cuing for gait pattern & increased weight bearing through RLE. Pt negotiated single step with BUE on R rail with min assist, c/o increased pain in RLE, & activity limited by need to return to room to void. Pt reports her family cannot provide assist at d/c but she has a friend that may be willing to help. Pt reports her house has steps with B rails to enter & friends house has 3 steps without rails to enter. Recommend ongoing PT services to progress mobility, RLE strengthening, & stair negotiation.    If plan is discharge home, recommend the following: A little help with walking and/or transfers;A little help with bathing/dressing/bathroom;Assistance with cooking/housework;Assist for transportation;Help with stairs or ramp for entrance   Can travel by private vehicle     Yes  Equipment Recommendations  BSC/3in1;Rolling walker (2 wheels)    Recommendations for Other Services       Precautions / Restrictions Precautions Precautions: Fall;Posterior Hip Precaution/Restrictions Comments: hip ABD pillow in bed Restrictions Weight Bearing Restrictions Per Provider Order: Yes RLE Weight Bearing Per Provider Order: Weight bearing as tolerated     Mobility  Bed Mobility Overal bed mobility: Needs Assistance Bed Mobility: Supine to Sit     Supine to sit: Contact guard, Min assist, Used rails, HOB elevated (light assistance to move RLE to  EOB; pt uses L foot to assist R foot to EOB but does not cross legs, PT encouraged her to not do this at all to prevent chance of crossing legs)          Transfers Overall transfer level: Needs assistance Equipment used: Rolling walker (2 wheels) Transfers: Sit to/from Stand Sit to Stand: Min assist, Supervision           General transfer comment: sit>stand from EOB with education re: hand placement to push to standing, stand>sit to toilet with min assist & cuing to use grab bar, transfers to standing from toilet with supervision    Ambulation/Gait Ambulation/Gait assistance: Supervision Gait Distance (Feet):  (100 ft + 100 ft) Assistive device: Rolling walker (2 wheels) Gait Pattern/deviations: Decreased step length - right, Decreased step length - left, Decreased stride length, Decreased dorsiflexion - right Gait velocity: decreased     General Gait Details: Pt reports she's not putting any weight through RLE during gait; encouraged her to do so. Pt also ambulating on forefoot of RLE, cuing for heel<>toe on RLE with fair return demo.   Stairs Stairs: Yes Stairs assistance: Min assist Stair Management: One rail Right, Step to pattern Number of Stairs: 1 (6) General stair comments: Pt negotiated single step with BUE on R rail, min assist, c/o increased pain in RLE when negotiating stairs 2/2 putting weight through RLE.   Wheelchair Mobility     Tilt Bed    Modified Rankin (Stroke Patients Only)       Balance Overall balance assessment: Needs assistance Sitting-balance support: No upper extremity supported, Feet  supported Sitting balance-Leahy Scale: Fair     Standing balance support: Bilateral upper extremity supported, During functional activity, Reliant on assistive device for balance Standing balance-Leahy Scale: Good                              Communication Communication Communication: No apparent difficulties  Cognition Arousal:  Alert Behavior During Therapy: WFL for tasks assessed/performed   PT - Cognitive impairments: No family/caregiver present to determine baseline, Awareness, Memory, Attention, Problem solving, Sequencing, Safety/Judgement                       PT - Cognition Comments: AxOx4. Decreased ability to recall posterior hip precautions even after PT educates her on them at beginning of session (at beginning of session pt did not seem aware of hip precautions). Following commands: Intact      Cueing Cueing Techniques: Verbal cues  Exercises      General Comments General comments (skin integrity, edema, etc.): Toileted with extra time for continent void. Performed hand hygiene & brushed teeth standing at sink with close supervision.      Pertinent Vitals/Pain Pain Assessment Pain Assessment: 0-10 Pain Score: 2  Faces Pain Scale: Hurts a little bit Pain Location: R hip Pain Descriptors / Indicators: Aching, Discomfort, Grimacing Pain Intervention(s): Monitored during session    Home Living                          Prior Function            PT Goals (current goals can now be found in the care plan section) Acute Rehab PT Goals Patient Stated Goal: to get more rehab PT Goal Formulation: With patient Time For Goal Achievement: 12/30/23 Potential to Achieve Goals: Good Progress towards PT goals: Progressing toward goals    Frequency    Min 2X/week      PT Plan      Co-evaluation              AM-PAC PT 6 Clicks Mobility   Outcome Measure  Help needed turning from your back to your side while in a flat bed without using bedrails?: None Help needed moving from lying on your back to sitting on the side of a flat bed without using bedrails?: A Little Help needed moving to and from a bed to a chair (including a wheelchair)?: A Little Help needed standing up from a chair using your arms (e.g., wheelchair or bedside chair)?: A Little Help needed to walk  in hospital room?: A Little Help needed climbing 3-5 steps with a railing? : A Lot 6 Click Score: 18    End of Session Equipment Utilized During Treatment: Gait belt Activity Tolerance: Patient tolerated treatment well Patient left: in chair;with call bell/phone within reach (wedge pillow in place, BLE elevated, tray across lap)   PT Visit Diagnosis: Unsteadiness on feet (R26.81);Other abnormalities of gait and mobility (R26.89);History of falling (Z91.81);Muscle weakness (generalized) (M62.81)     Time: 8858-8790 PT Time Calculation (min) (ACUTE ONLY): 28 min  Charges:    $Therapeutic Activity: 23-37 mins PT General Charges $$ ACUTE PT VISIT: 1 Visit                     Tricia [name], PT, DPT 12/18/23, 12:18 PM   Tricia [name] 12/18/2023, 12:17 PM

## 2023-12-18 NOTE — Progress Notes (Signed)
 Mobility Specialist Progress Note:    12/18/23 1005  Mobility  Activity Ambulated with assistance (In hallway)  Level of Assistance Contact guard assist, steadying assist  Assistive Device Front wheel walker  Distance Ambulated (ft) 100 ft  RLE Weight Bearing Per Provider Order WBAT  Activity Response Tolerated well  Mobility Referral Yes  Mobility visit 1 Mobility  Mobility Specialist Start Time (ACUTE ONLY) [id number]  Mobility Specialist Stop Time (ACUTE ONLY) 0917  Mobility Specialist Time Calculation (min) (ACUTE ONLY) 26 min   Received pt in chair and agreeable to mobility. Required to use BR prior to ambulating in hallway. Pt required no physical assistance. No c/o. Returned to room without fault. Left pt in bed with alarm on. Personal belongings and call light within reach. All needs met.  Lavanda Pollack Mobility Specialist  Please contact via [employer] or  Rehab Office [phone]

## 2023-12-18 NOTE — Plan of Care (Signed)

## 2023-12-19 NOTE — Progress Notes (Signed)
 Mobility Specialist Progress Note:    12/19/23 0950  Mobility  Activity Ambulated with assistance  Level of Assistance Standby assist, set-up cues, supervision of patient - no hands on  Assistive Device Front wheel walker  Distance Ambulated (ft) 150 ft  RLE Weight Bearing Per Provider Order WBAT  Activity Response Tolerated well  Mobility Referral Yes  Mobility visit 1 Mobility  Mobility Specialist Start Time (ACUTE ONLY) 0950  Mobility Specialist Stop Time (ACUTE ONLY) 1016  Mobility Specialist Time Calculation (min) (ACUTE ONLY) 26 min   Pt pleasant in bed agreeable to session. No c/o any symptoms. Pt first transferred to [facility], then to hallway. Pt moving well but afraid to put complete weight through right leg. Returned pt to room w/ all needs met.   Venetia Keel Mobility Specialist Please Neurosurgeon or Rehab Office at [phone]

## 2023-12-19 NOTE — Plan of Care (Signed)
  Problem: Education: Goal: Knowledge of General Education information will improve Description: Including pain rating scale, medication(s)/side effects and non-pharmacologic comfort measures Outcome: Progressing   Problem: Activity: Goal: Risk for activity intolerance will decrease Outcome: Progressing   Problem: Pain Managment: Goal: General experience of comfort will improve and/or be controlled Outcome: Progressing   Problem: Safety: Goal: Ability to remain free from injury will improve Outcome: Progressing   Problem: Clinical Measurements: Goal: Postoperative complications will be avoided or minimized Outcome: Progressing

## 2023-12-19 NOTE — TOC Progression Note (Addendum)
 Transition of Care [facility]) - Progression Note    Patient Details  Name: Tricia [name] MRN: [id number] Date of Birth: Sep 04, 1945  [facility] CM/SW Contact  Isaiah Public, LCSWA Phone Number: 12/19/2023, 3:49 PM  Clinical Narrative:     CSW provided SNF bed offers to patient. Patient accepted SNF bed offer with [facility]. CSW spoke with with April with [facility] who confirmed SNF bed for patient. April confirmed facility can accept patient on Monday if medically ready and auth approved. CSW requested for Jon CMA to start insurance authorization for patient.  Update- Jon CMA informed CSW that patients insurance authorization currently pending. Mayme Barrows PI#[id number] .  Expected Discharge Plan: Skilled Nursing Facility Barriers to Discharge: Continued Medical Work up, English as a second language teacher, SNF Pending bed offer               Expected Discharge Plan and Services       Living arrangements for the past 2 months: Single Family Home                                       Social Drivers of Health (SDOH) Interventions SDOH Screenings   Food Insecurity: No Food Insecurity (12/14/2023)  Housing: Low Risk  (12/14/2023)  Transportation Needs: No Transportation Needs (12/14/2023)  Utilities: Not At Risk (12/14/2023)  Depression (PHQ2-9): Low Risk  (10/29/2020)  Social Connections: Moderately Integrated (12/14/2023)  Tobacco Use: Medium Risk (12/15/2023)    Readmission Risk Interventions     No data to display

## 2023-12-19 NOTE — Progress Notes (Signed)
 Occupational Therapy Treatment Patient Details Name: Tricia [name] MRN: [id number] DOB: [birth year] Today's Date: 12/19/2023   History of present illness 78 y.o. female presents to [facility] 12/14/23 after falling. Pt with R femoral neck fx. 9/2 R hemiarthroplasty. PMHx: tachycardia   OT comments  Pt presented in bed and agreeable to session. She needed repeated instruction on hip precautions in session and how to complete tasks with AE. Pt then completed toileting tasks with supervision to CGA and then ambulated with CGA. Pt then again could not recall hip precautions and needed assist with recall. Patient will benefit from continued inpatient follow up therapy, <3 hours/day       If plan is discharge home, recommend the following:  A little help with walking and/or transfers;A lot of help with bathing/dressing/bathroom;Assistance with cooking/housework;Assist for transportation;Help with stairs or ramp for entrance   Equipment Recommendations  Other (comment) (tbd)    Recommendations for Other Services      Precautions / Restrictions Precautions Precautions: Fall;Posterior Hip Restrictions Weight Bearing Restrictions Per Provider Order: Yes RLE Weight Bearing Per Provider Order: Weight bearing as tolerated       Mobility Bed Mobility Overal bed mobility: Needs Assistance Bed Mobility: Supine to Sit     Supine to sit: Contact guard, Used rails, HOB elevated          Transfers Overall transfer level: Needs assistance Equipment used: Rolling walker (2 wheels) Transfers: Sit to/from Stand Sit to Stand: Contact guard assist                 Balance Overall balance assessment: Needs assistance Sitting-balance support: Feet supported Sitting balance-Leahy Scale: Good     Standing balance support: Bilateral upper extremity supported Standing balance-Leahy Scale: Fair Standing balance comment: reliant on RW for support                           ADL  either performed or assessed with clinical judgement   ADL Overall ADL's : Needs assistance/impaired Eating/Feeding: Independent   Grooming: Set up;Sitting   Upper Body Bathing: Set up;Sitting   Lower Body Bathing: Maximal assistance;Sitting/lateral leans   Upper Body Dressing : Set up;Sitting   Lower Body Dressing: Maximal assistance;Sitting/lateral leans   Toilet Transfer: Stand-pivot;Rolling walker (2 wheels);BSC/3in1;Contact guard assist   Toileting- Clothing Manipulation and Hygiene: Contact guard assist;Sit to/from stand       Functional mobility during ADLs: Minimal assistance;Rolling walker (2 wheels)      Extremity/Trunk Assessment Upper Extremity Assessment Upper Extremity Assessment: Overall WFL for tasks assessed   Lower Extremity Assessment Lower Extremity Assessment: Defer to PT evaluation        Vision   Vision Assessment?: Wears glasses for reading;Wears glasses for driving   Perception     Praxis     Communication Communication Communication: No apparent difficulties   Cognition Arousal: Alert Behavior During Therapy: WFL for tasks assessed/performed Cognition: No family/caregiver present to determine baseline             OT - Cognition Comments: noted could not recall precautions even though repeated multiple times in sesion                 Following commands: Intact        Cueing   Cueing Techniques: Verbal cues  Exercises      Shoulder Instructions       General Comments      Pertinent Vitals/ Pain  Pain Assessment Pain Assessment: 0-10 Pain Score: 3  Pain Location: R hip Pain Descriptors / Indicators: Aching, Discomfort, Grimacing Pain Intervention(s): Limited activity within patient's tolerance, Monitored during session, Repositioned  Home Living                                          Prior Functioning/Environment              Frequency  Min 2X/week        Progress  Toward Goals  OT Goals(current goals can now be found in the care plan section)  Progress towards OT goals: Progressing toward goals  Acute Rehab OT Goals Patient Stated Goal: to go to rehab OT Goal Formulation: With patient/family Time For Goal Achievement: 12/30/23 Potential to Achieve Goals: Good ADL Goals Pt Will Perform Upper Body Dressing: with modified independence Pt Will Perform Lower Body Dressing: with set-up;with adaptive equipment;sit to/from stand Pt Will Transfer to Toilet: with modified independence Pt Will Perform Toileting - Clothing Manipulation and hygiene: with modified independence  Plan      Co-evaluation                 AM-PAC OT 6 Clicks Daily Activity     Outcome Measure   Help from another person eating meals?: None Help from another person taking care of personal grooming?: A Little Help from another person toileting, which includes using toliet, bedpan, or urinal?: A Little Help from another person bathing (including washing, rinsing, drying)?: A Lot Help from another person to put on and taking off regular upper body clothing?: A Little Help from another person to put on and taking off regular lower body clothing?: A Lot 6 Click Score: 17    End of Session Equipment Utilized During Treatment: Gait belt;Rolling walker (2 wheels)  OT Visit Diagnosis: Unsteadiness on feet (R26.81);Other abnormalities of gait and mobility (R26.89);Muscle weakness (generalized) (M62.81);Pain Pain - Right/Left: Right Pain - part of body: Hip   Activity Tolerance Patient tolerated treatment well   Patient Left in bed;with call bell/phone within reach;with bed alarm set;with family/visitor present   Nurse Communication Mobility status        Time: 8563-8495 OT Time Calculation (min): 28 min  Charges: OT General Charges $OT Visit: 1 Visit OT Treatments $Self Care/Home Management : 23-37 mins  Warrick POUR OTR/L  Acute Rehab Services  [phone] office  number   Warrick Berber 12/19/2023, 3:19 PM

## 2023-12-19 NOTE — Plan of Care (Signed)

## 2023-12-19 NOTE — Progress Notes (Signed)
 Orthopaedic Progress Note  S: Patient is resting comfortably in bed.  No new issues  O:  Vitals:   12/19/23 0457 12/19/23 0826  BP: (!) 111/54 (!) 106/52  Pulse: 68 76  Resp: 16 18  Temp: 98.2 F (36.8 C) 98.4 F (36.9 C)  SpO2: 99% 99%    Thigh soft and compressible.  Intact sensation in the saphenous sural tibial peroneal distributions.  5 5 strength EHL FHL gastroc.  No calf swelling or tenderness         Labs:  Lab Results Last 24 Hours  No results found for this or any previous visit (from the past 24 hours).     Assessment: Postop day 3 status post right hip hemiarthroplasty   Tricia [name] is doing well after surgery.  Her pain is tolerable.  Her confusion seems to be improved.  Will continue Tylenol  for pain and continue to observe this.  We will mobilize with physical therapy today.  She will observe posterior hip precautions.  She will continue with the hip abduction pillow while in bed.  Continue the aquacel dressing for 7 days.  Will continue aspirin  for DVT prophylaxis.  Continue his current pain regimen.  Continue the current bowel regimen.  Anticipate discharge i to subacute rehab per recommendations from therapy   Injuries: Right femoral neck fracture status post right hip hemiarthroplasty             Weightbearing: Weightbearing as tolerated with posterior hip precautions             Insicional and dressing care: Aquacel dressing                    Pain management: Continue with hydrocodone    VTE prophylaxis: Aspirin      Dispo: Subacute therapy, plan for discharge on Monday   Follow - up plan: Will follow with the patient daily   Tricia Rhein, MD, MS [name] Specialist [phone]

## 2023-12-20 NOTE — Progress Notes (Signed)
 Orthopaedic Progress Note  S: The patient reports some discomfort in her right thigh but tolerable.  She has been working with therapy and is making good progress  O:  Vitals:   12/20/23 0400 12/20/23 0835  BP: 118/61 129/67  Pulse: 75 63  Resp: 16 16  Temp: 98.4 F (36.9 C) 98.3 F (36.8 C)  SpO2: 98% 100%    Thigh soft and compressible.  Intact sensation in the saphenous sural tibial peroneal distributions.  5 5 strength EHL FHL gastroc.  No calf swelling or tenderness     Labs: No results found for this or any previous visit (from the past 24 hours).  Assessment: Postop day 4 status post right hip hemiarthroplasty   Tricia [name] is doing well after surgery.  Her pain is tolerable.  Her confusion seems to be improved.  Will continue Tylenol  for pain and continue to observe this.  We will mobilize with physical therapy today.  She will observe posterior hip precautions.  She will continue with the hip abduction pillow while in bed.  Continue the aquacel dressing for 7 days.  Will continue aspirin  for DVT prophylaxis.  Continue his current pain regimen.  Continue the current bowel regimen.  Anticipate discharge i to subacute rehab per recommendations from therapy   Injuries: Right femoral neck fracture status post right hip hemiarthroplasty             Weightbearing: Weightbearing as tolerated with posterior hip precautions             Insicional and dressing care: Aquacel dressing                    Pain management: Continue with hydrocodone    VTE prophylaxis: Aspirin      Dispo: Subacute therapy, plan for discharge on Monday   Follow - up plan: Will follow with the patient daily     Cordella Rhein, MD, MS [facility] Specialist [phone]

## 2023-12-20 NOTE — Plan of Care (Signed)

## 2023-12-20 NOTE — Plan of Care (Signed)
  Problem: Education: Goal: Knowledge of General Education information will improve Description: Including pain rating scale, medication(s)/side effects and non-pharmacologic comfort measures Outcome: Progressing   Problem: Health Behavior/Discharge Planning: Goal: Ability to manage health-related needs will improve Outcome: Progressing   Problem: Pain Managment: Goal: General experience of comfort will improve and/or be controlled Outcome: Progressing   Problem: Skin Integrity: Goal: Risk for impaired skin integrity will decrease Outcome: Progressing

## 2023-12-21 NOTE — Progress Notes (Signed)
 Orthopaedic Progress Note  S: Patient has no complaints  O:  Vitals:   12/20/23 2100 12/21/23 0519  BP: (!) 101/58 127/65  Pulse: 76 71  Resp: 17 18  Temp: 98.1 F (36.7 C) 98.4 F (36.9 C)  SpO2: 100% 100%     Orthopaedic Progress Note   S: Patient is resting comfortably in bed.  No new issues   O:      Vitals:    12/19/23 0457 12/19/23 0826  BP: (!) 111/54 (!) 106/52  Pulse: 68 76  Resp: 16 18  Temp: 98.2 F (36.8 C) 98.4 F (36.9 C)  SpO2: 99% 99%      Thigh soft and compressible.  Intact sensation in the saphenous sural tibial peroneal distributions.  5 5 strength EHL FHL gastroc.  No calf swelling or tenderness         Labs:  Lab Results Last 24 Hours  No results found for this or any previous visit (from the past 24 hours).      Assessment: Postop day 5 status post right hip hemiarthroplasty   Tricia [name] is doing well after surgery.  Her pain is tolerable.  Her confusion seems to be improved.  Will continue Tylenol  for pain and continue to observe this.  We will mobilize with physical therapy today.  She will observe posterior hip precautions.  She will continue with the hip abduction pillow while in bed.  Continue the aquacel dressing for 7 days.  Will continue aspirin  for DVT prophylaxis.  Continue his current pain regimen.  Continue the current bowel regimen.  Anticipate discharge i to subacute rehab per recommendations from therapy   Injuries: Right femoral neck fracture status post right hip hemiarthroplasty             Weightbearing: Weightbearing as tolerated with posterior hip precautions             Insicional and dressing care: Aquacel dressing                    Pain management: Continue with hydrocodone    VTE prophylaxis: Aspirin      Dispo: Subacute therapy, plan for discharge on today   Follow - up plan: Will follow with the patient daily       Cordella Rhein, MD, MS [facility]ist [phone]

## 2023-12-21 NOTE — Plan of Care (Signed)

## 2023-12-21 NOTE — Progress Notes (Signed)
 Physical Therapy Treatment Patient Details Name: Tricia [name] MRN: [id number] DOB: June 29, 1945 Today's Date: 12/21/2023   History of Present Illness 78 y.o. female presents to [facility] 12/14/23 after falling. Pt with R femoral neck fx. 9/2 R hemiarthroplasty. PMHx: tachycardia    PT Comments  Pt up in bathroom on arrival and agreeable to session with continued progress towards acute goals. Pt continues to require cues to recall posterior hip precautions and cues throughout mobility for adherence as pt attempting to sit with RLE crossed over L during rest break in hall. Pt demonstrating transfers and gait with grossly CGA fading to supervision with increased distance with RW for support with cues for improved gait mechanics intermittently. Patient will benefit from continued inpatient follow up therapy, <3 hours/day, however may continue to progress well and benefit from HHPT, pending available support post acutely. Pt continues to benefit from skilled PT services to progress toward functional mobility goals.     If plan is discharge home, recommend the following: A little help with walking and/or transfers;A little help with bathing/dressing/bathroom;Assistance with cooking/housework;Assist for transportation;Help with stairs or ramp for entrance   Can travel by private vehicle     Yes  Equipment Recommendations  BSC/3in1;Rolling walker (2 wheels)    Recommendations for Other Services       Precautions / Restrictions Precautions Precautions: Fall;Posterior Hip Precaution Booklet Issued: Yes (comment) Recall of Precautions/Restrictions: Intact Precaution/Restrictions Comments: hip ABD pillow in bed Restrictions Weight Bearing Restrictions Per Provider Order: No RLE Weight Bearing Per Provider Order: Weight bearing as tolerated     Mobility  Bed Mobility Overal bed mobility: Needs Assistance             General bed mobility comments: up in bathroom on arrival seated EOB at  end of session    Transfers Overall transfer level: Needs assistance Equipment used: Rolling walker (2 wheels) Transfers: Sit to/from Stand Sit to Stand: Contact guard assist           General transfer comment: light cues for hand placement from low commode    Ambulation/Gait Ambulation/Gait assistance: Supervision Gait Distance (Feet): 110 Feet (x2) Assistive device: Rolling walker (2 wheels) Gait Pattern/deviations: Decreased step length - right, Decreased step length - left, Decreased stride length, Decreased dorsiflexion - right Gait velocity: decreased     General Gait Details: cues to maitnain RW in contact with floor, increased fluidity of gait with distance, cues for increased heel strike on R   Stairs             Wheelchair Mobility     Tilt Bed    Modified Rankin (Stroke Patients Only)       Balance Overall balance assessment: Needs assistance Sitting-balance support: Feet supported Sitting balance-Leahy Scale: Good     Standing balance support: Bilateral upper extremity supported Standing balance-Leahy Scale: Fair Standing balance comment: reliant on RW for support                            Communication Communication Communication: No apparent difficulties  Cognition Arousal: Alert Behavior During Therapy: WFL for tasks assessed/performed   PT - Cognitive impairments: No family/caregiver present to determine baseline, Awareness, Memory, Attention, Problem solving, Sequencing, Safety/Judgement   Orientation impairments: Place                   PT - Cognition Comments: AxOx4. Decreased ability to recall posterior hip precautions even after PTA educates her  on them during session Following commands: Intact      Cueing Cueing Techniques: Verbal cues  Exercises      General Comments General comments (skin integrity, edema, etc.): Toileted with extra time for continent void. Performed hand hygiene & brushed teeth  standing at sink with close supervision.      Pertinent Vitals/Pain Pain Assessment Pain Assessment: Faces Faces Pain Scale: Hurts a little bit Pain Location: R hip Pain Descriptors / Indicators: Aching, Discomfort, Grimacing Pain Intervention(s): Monitored during session, Limited activity within patient's tolerance    Home Living                          Prior Function            PT Goals (current goals can now be found in the care plan section) Acute Rehab PT Goals Patient Stated Goal: to get more rehab PT Goal Formulation: With patient Time For Goal Achievement: 12/30/23 Progress towards PT goals: Progressing toward goals    Frequency    Min 2X/week      PT Plan      Co-evaluation              AM-PAC PT 6 Clicks Mobility   Outcome Measure  Help needed turning from your back to your side while in a flat bed without using bedrails?: None Help needed moving from lying on your back to sitting on the side of a flat bed without using bedrails?: A Little Help needed moving to and from a bed to a chair (including a wheelchair)?: A Little Help needed standing up from a chair using your arms (e.g., wheelchair or bedside chair)?: A Little Help needed to walk in hospital room?: A Little Help needed climbing 3-5 steps with a railing? : A Lot 6 Click Score: 18    End of Session   Activity Tolerance: Patient tolerated treatment well Patient left: with call bell/phone within reach;in bed;with family/visitor present (seated EOB) Nurse Communication: Mobility status PT Visit Diagnosis: Unsteadiness on feet (R26.81);Other abnormalities of gait and mobility (R26.89);History of falling (Z91.81);Muscle weakness (generalized) (M62.81)     Time: 1413-1440 PT Time Calculation (min) (ACUTE ONLY): 27 min  Charges:    $Gait Training: 8-22 mins $Therapeutic Activity: 8-22 mins PT General Charges $$ ACUTE PT VISIT: 1 Visit                     Eryn Marandola R.  PTA Acute Rehabilitation Services Office: [phone]   Therisa CHRISTELLA Boor 12/21/2023, 2:47 PM

## 2023-12-21 NOTE — TOC Progression Note (Addendum)
 Transition of Care [facility]) - Progression Note    Patient Details  Name: Tricia [name] MRN: [id number] Date of Birth: 1945-08-27  Transition of Care [facility] CM/SW Contact  Brennah Quraishi LITTIE Moose, [place] Phone Number: 12/21/2023, 1:34 PM  Clinical Narrative:    CSW received auth approval for [facility] effective 9/8 - 9/10 [employer] Auth PI#[id number].   3:15 PM- CSW received a call from [facility] with [facility] saying they could no longer accept pt into the facility. PT said pt could go home as long as she had assist. CSW spoke with pt son, Clotilda and he said one of pt friends offered for the pt to come stay with them so someone would be at the house all the time. Clotilda stated pt friend's husband has recently been through a similar situation and they have all the equipment she would need to help her get around the house. PT to see pt in the morning one more time before d/c home.     Expected Discharge Plan: Skilled Nursing Facility Barriers to Discharge: Continued Medical Work up, English as a second language teacher, SNF Pending bed offer               Expected Discharge Plan and Services       Living arrangements for the past 2 months: Single Family Home Expected Discharge Date: 12/21/23                                     Social Drivers of Health (SDOH) Interventions SDOH Screenings   Food Insecurity: No Food Insecurity (12/14/2023)  Housing: Low Risk  (12/14/2023)  Transportation Needs: No Transportation Needs (12/14/2023)  Utilities: Not At Risk (12/14/2023)  Depression (PHQ2-9): Low Risk  (10/29/2020)  Social Connections: Moderately Integrated (12/14/2023)  Tobacco Use: Medium Risk (12/15/2023)    Readmission Risk Interventions     No data to display

## 2023-12-21 NOTE — Plan of Care (Signed)

## 2023-12-21 NOTE — Progress Notes (Signed)
 Mobility Specialist Progress Note:    12/21/23 0924  Mobility  Activity Ambulated with assistance (In hallway)  Level of Assistance Standby assist, set-up cues, supervision of patient - no hands on  Assistive Device Front wheel walker  Distance Ambulated (ft) 150 ft  RLE Weight Bearing Per Provider Order WBAT  Activity Response Tolerated well  Mobility Referral Yes  Mobility visit 1 Mobility  Mobility Specialist Start Time (ACUTE ONLY) 0855  Mobility Specialist Stop Time (ACUTE ONLY) 0920  Mobility Specialist Time Calculation (min) (ACUTE ONLY) 25 min   Received pt in bed and agreeable to mobility. No physical assistance needed. Pt requested to use the BR prior to ambulating in halls. No c/o. Pt took x1 seated rest break d/t fatigue, otherwise tolerated well. Returned to room without fault. Left pt in chair with personal belongings and call light within reach. All needs met.  Lavanda Pollack Mobility Specialist  Please contact via [employer] or  Rehab Office [phone]'

## 2023-12-22 NOTE — Progress Notes (Signed)
 Occupational Therapy Treatment Patient Details Name: Tricia [name] MRN: [id number] DOB: [birth year] Today's Date: 12/22/2023   History of present illness 78 y.o. female presents to [facility] 12/14/23 after falling. Pt with R femoral neck fx. 9/2 R hemiarthroplasty. PMHx: tachycardia   OT comments  Pt progressing toward goals this session, performs bathing with up to mod A, CGA for bed mobility and transfers with RW. Pt needs min cues to recall posterior hip precautions, also educated on/provide with LB AE for home. Pt presenting with impairments listed below, will follow acutely to maximize safety/ind with ADL/functional mobility. Patient will benefit from continued inpatient follow up therapy, <3 hours/day, if unable will need frequent assist and d/c with HHOT services.       If plan is discharge home, recommend the following:  A little help with walking and/or transfers;A lot of help with bathing/dressing/bathroom;Assistance with cooking/housework;Assist for transportation;Help with stairs or ramp for entrance   Equipment Recommendations  Other (comment) (RW)    Recommendations for Other Services      Precautions / Restrictions Precautions Precautions: Fall;Posterior Hip Precaution Booklet Issued: Yes (comment) (cues to recall post hip prec with activity) Recall of Precautions/Restrictions: Impaired Restrictions Weight Bearing Restrictions Per Provider Order: No RLE Weight Bearing Per Provider Order: Weight bearing as tolerated       Mobility Bed Mobility Overal bed mobility: Needs Assistance Bed Mobility: Supine to Sit, Sit to Supine     Supine to sit: Contact guard Sit to supine: Contact guard assist        Transfers Overall transfer level: Needs assistance Equipment used: Rolling walker (2 wheels) Transfers: Sit to/from Stand Sit to Stand: Contact guard assist                 Balance Overall balance assessment: Needs assistance Sitting-balance support:  Feet supported Sitting balance-Leahy Scale: Good     Standing balance support: Bilateral upper extremity supported Standing balance-Leahy Scale: Fair Standing balance comment: reliant on RW for support                           ADL either performed or assessed with clinical judgement   ADL Overall ADL's : Needs assistance/impaired         Upper Body Bathing: Minimal assistance;Sitting   Lower Body Bathing: Minimal assistance;Sitting/lateral leans   Upper Body Dressing : Set up;Sitting   Lower Body Dressing: Moderate assistance;Sitting/lateral leans;With adaptive equipment               Functional mobility during ADLs: Rolling walker (2 wheels);Supervision/safety      Extremity/Trunk Assessment Upper Extremity Assessment Upper Extremity Assessment: Overall WFL for tasks assessed   Lower Extremity Assessment Lower Extremity Assessment: Defer to PT evaluation        Vision   Vision Assessment?: Wears glasses for reading;Wears glasses for driving   Perception Perception Perception: Not tested   Praxis Praxis Praxis: Not tested   Communication Communication Communication: No apparent difficulties   Cognition Arousal: Alert Behavior During Therapy: WFL for tasks assessed/performed Cognition: No family/caregiver present to determine baseline                               Following commands: Intact        Cueing   Cueing Techniques: Verbal cues  Exercises      Shoulder Instructions       General Comments VSS  Pertinent Vitals/ Pain       Pain Assessment Pain Assessment: Faces Pain Score: 3  Faces Pain Scale: Hurts a little bit Pain Location: R hip Pain Descriptors / Indicators: Aching, Discomfort, Grimacing Pain Intervention(s): Limited activity within patient's tolerance, Monitored during session, Repositioned  Home Living                                          Prior Functioning/Environment               Frequency  Min 2X/week        Progress Toward Goals  OT Goals(current goals can now be found in the care plan section)  Progress towards OT goals: Progressing toward goals  Acute Rehab OT Goals Patient Stated Goal: none stated OT Goal Formulation: With patient/family Time For Goal Achievement: 12/30/23 Potential to Achieve Goals: Good ADL Goals Pt Will Perform Upper Body Dressing: with modified independence Pt Will Perform Lower Body Dressing: with set-up;with adaptive equipment;sit to/from stand Pt Will Transfer to Toilet: with modified independence Pt Will Perform Toileting - Clothing Manipulation and hygiene: with modified independence  Plan      Co-evaluation                 AM-PAC OT 6 Clicks Daily Activity     Outcome Measure   Help from another person eating meals?: None Help from another person taking care of personal grooming?: A Little Help from another person toileting, which includes using toliet, bedpan, or urinal?: A Little Help from another person bathing (including washing, rinsing, drying)?: A Lot Help from another person to put on and taking off regular upper body clothing?: A Little Help from another person to put on and taking off regular lower body clothing?: A Lot 6 Click Score: 17    End of Session Equipment Utilized During Treatment: Rolling walker (2 wheels)  OT Visit Diagnosis: Unsteadiness on feet (R26.81);Other abnormalities of gait and mobility (R26.89);Muscle weakness (generalized) (M62.81);Pain Pain - Right/Left: Right Pain - part of body: Hip   Activity Tolerance Patient tolerated treatment well   Patient Left in bed;with call bell/phone within reach;with bed alarm set   Nurse Communication Mobility status        Time: 8886-8842 OT Time Calculation (min): 44 min  Charges: OT General Charges $OT Visit: 1 Visit OT Treatments $Self Care/Home Management : 38-52 mins  Nyeshia Mysliwiec K, OTD, OTR/L SecureChat  Preferred Acute Rehab [phone]   Laneta POUR Koonce 12/22/2023, 12:31 PM

## 2023-12-22 NOTE — Progress Notes (Signed)
 Physical Therapy Treatment Patient Details Name: Tricia [name] MRN: [id number] DOB: [birth year] Today's Date: 12/22/2023   History of Present Illness 78 y.o. female presents [facility] 12/14/23 after falling. Pt with R femoral neck fx. 9/2 R hemiarthroplasty. PMHx: tachycardia    PT Comments  Pt resting in bed on arrival, pleasant and eager for mobility. Pt demonstrating bed mobility, transfers and gait with RW for support with grossly CGA to supervision for safety. Pt continues to require cues throughout mobility for adherence to posterior hip precautions. Pt able to progress strait training this session with cues for sequencing and CGA for safety. Pt was educated on continued walker use to maximize functional independence, safety, and decrease risk for falls as well as appropriate activity progression, all precautions and importance of continued mobility with pt verbalizing understanding. Patient will benefit from continued inpatient follow up therapy, <3 hours/day, if unable will require assist level outlined below and HHPT. Will continue to follow acutely.     If plan is discharge home, recommend the following: A little help with walking and/or transfers;A little help with bathing/dressing/bathroom;Assistance with cooking/housework;Assist for transportation;Help with stairs or ramp for entrance   Can travel by private vehicle     Yes  Equipment Recommendations  BSC/3in1;Rolling walker (2 wheels)    Recommendations for Other Services       Precautions / Restrictions Precautions Precautions: Fall;Posterior Hip Precaution Booklet Issued: Yes (comment) (cues to recall post hip prec with activity) Recall of Precautions/Restrictions: Impaired Restrictions Weight Bearing Restrictions Per Provider Order: No RLE Weight Bearing Per Provider Order: Weight bearing as tolerated     Mobility  Bed Mobility Overal bed mobility: Needs Assistance Bed Mobility: Supine to Sit, Sit to Supine      Supine to sit: Contact guard Sit to supine: Contact guard assist   General bed mobility comments: CGA for safety    Transfers Overall transfer level: Needs assistance Equipment used: Rolling walker (2 wheels) Transfers: Sit to/from Stand Sit to Stand: Contact guard assist           General transfer comment: CGA for safety    Ambulation/Gait Ambulation/Gait assistance: Supervision Gait Distance (Feet): 225 Feet Assistive device: Rolling walker (2 wheels) Gait Pattern/deviations: Decreased step length - right, Decreased step length - left, Decreased stride length, Decreased dorsiflexion - right Gait velocity: decr     General Gait Details: cues to maitnain RW in contact with floor, increased fluidity of gait with distance, cues for increased heel strike on R   Stairs Stairs: Yes Stairs assistance: Contact guard assist Stair Management: One rail Left, Step to pattern, Forwards Number of Stairs: 4 General stair comments: cues for sequencing with pt demosntrating good recall   Wheelchair Mobility     Tilt Bed    Modified Rankin (Stroke Patients Only)       Balance Overall balance assessment: Needs assistance Sitting-balance support: Feet supported Sitting balance-Leahy Scale: Good     Standing balance support: Bilateral upper extremity supported Standing balance-Leahy Scale: Fair Standing balance comment: reliant on RW for support                            Communication Communication Communication: No apparent difficulties  Cognition Arousal: Alert Behavior During Therapy: WFL for tasks assessed/performed                             Following commands: Intact  Cueing Cueing Techniques: Verbal cues  Exercises      General Comments General comments (skin integrity, edema, etc.): VSS on RA      Pertinent Vitals/Pain Pain Assessment Pain Assessment: Faces Faces Pain Scale: Hurts a little bit Pain Location: R  hip Pain Descriptors / Indicators: Aching, Discomfort, Grimacing Pain Intervention(s): Monitored during session, Limited activity within patient's tolerance    Home Living                          Prior Function            PT Goals (current goals can now be found in the care plan section) Acute Rehab PT Goals PT Goal Formulation: With patient Time For Goal Achievement: 12/30/23 Progress towards PT goals: Progressing toward goals    Frequency    Min 2X/week      PT Plan      Co-evaluation              AM-PAC PT 6 Clicks Mobility   Outcome Measure  Help needed turning from your back to your side while in a flat bed without using bedrails?: None Help needed moving from lying on your back to sitting on the side of a flat bed without using bedrails?: A Little Help needed moving to and from a bed to a chair (including a wheelchair)?: A Little Help needed standing up from a chair using your arms (e.g., wheelchair or bedside chair)?: A Little Help needed to walk in hospital room?: A Little Help needed climbing 3-5 steps with a railing? : A Little 6 Click Score: 19    End of Session   Activity Tolerance: Patient tolerated treatment well Patient left: in bed;with call bell/phone within reach Nurse Communication: Mobility status PT Visit Diagnosis: Unsteadiness on feet (R26.81);Other abnormalities of gait and mobility (R26.89);History of falling (Z91.81);Muscle weakness (generalized) (M62.81)     Time: 8581-8557 PT Time Calculation (min) (ACUTE ONLY): 24 min  Charges:    $Gait Training: 23-37 mins PT General Charges $$ ACUTE PT VISIT: 1 Visit                     Shannan Slinker R. PTA Acute Rehabilitation Services Office: [phone]   Therisa CHRISTELLA Boor 12/22/2023, 4:16 PM

## 2023-12-22 NOTE — Progress Notes (Signed)
 Orthopaedic Progress Note  S: no complaints  O:  Vitals:   12/21/23 2026 12/22/23 0352  BP: (!) 116/57 130/66  Pulse: 74 74  Resp:  17  Temp: 98.3 F (36.8 C) 98.2 F (36.8 C)  SpO2: 99% 99%    Thigh soft and compressible.  Intact sensation in the saphenous sural tibial peroneal distributions.  5 5 strength EHL FHL gastroc.  No calf swelling or tenderness         Labs:  Lab Results Last 24 Hours  No results found for this or any previous visit (from the past 24 hours).      Assessment: Postop day 6 status post right hip hemiarthroplasty   Ms. Harrel is doing well after surgery.  Her pain is tolerable.  She did have some confusion overnight.  She was only getting Tylenol  for her pain.  This most likely was sent downing.  We will continue to observe this.  We will mobilize with physical therapy today.  She will observe posterior hip precautions.  She will continue with the hip abduction pillow while in bed.  Continue the aquacel dressing for 7 days.  Will continue aspirin  for DVT prophylaxis.  Continue his current pain regimen.  Continue the current bowel regimen.  The patient is doing better with therapy can hopefully discharge to home  Injuries: Right femoral neck fracture status post right hip hemiarthroplasty             Weightbearing: Weightbearing as tolerated with posterior hip precautions             Insicional and dressing care: Aquacel dressing                    Pain management: Continue with hydrocodone    VTE prophylaxis: Aspirin      Dispo: Home with home health plan for discharge on today   Follow -the patient doing better with therapy.  Hopefully can be discharged to home.  Will follow while here in the hospital        Cordella Rhein, MD, MS [facility] [phone]

## 2023-12-31 NOTE — Discharge Summary (Signed)
 Physician Discharge Summary  Patient ID: Tricia [name] MRN: [id number] DOB/AGE: 10/19/1945 78 y.o.  Admit date: 12/14/2023 Discharge date: 12/31/2023  Admission Diagnoses:  Discharge Diagnoses:  Principal Problem:   Femur fracture, right [facility])   Discharged Condition: good  Hospital Course: The patient had fallen and sustained a right femoral neck fracture.  She was seen at an outside ER and transferred to the hospital.  She underwent a right hemi-arthroplaty for fracture on 12/15/2023.  She was admitted to the floor.  She was seen by PT and OT.  Initial plans were for her to transfer to rehab but was not able to due so dure to insurance and dietary issues.  She improved wit therapy and was able to be discharged to home.   Consults: None  Significant Diagnostic Studies:   Treatments: surgery: Right hip hemi-arthroplasty  Discharge Exam: Blood pressure 124/60, pulse 84, temperature 97.8 F (36.6 C), temperature source Oral, resp. rate 17, height 5' 2 (1.575 m), weight 42.1 kg, SpO2 100%. General appearance: alert and cooperative  5/5 strength in the leg  Disposition: Discharge disposition: 02-Transferred to [facility]        Allergies as of 12/22/2023       Reactions   Codeine Other (See Comments)   Emotional-Pt reports that she feels like crying   Gluten Meal Other (See Comments)   Unknown    Milk-related Compounds Other (See Comments)   Unknown    Other Other (See Comments)   MSG and oats- unknown    Sulfa Antibiotics Other (See Comments)   GI problems   Iodinated Contrast Media Dermatitis   Patient states she had a rash with IV contrast   Iodine  Rash   Patient states she had a rash with IV contrast        Medication List     TAKE these medications    aspirin  EC 325 MG tablet Take 1 tablet (325 mg total) by mouth daily with breakfast.   CALCIUM 500 PO Take 2 tablets by mouth daily.   docusate sodium  100 MG capsule Commonly known as:  COLACE Take 1 capsule (100 mg total) by mouth 2 (two) times daily.   HYDROcodone -acetaminophen  5-325 MG tablet Commonly known as: NORCO/VICODIN Take 1-2 tablets by mouth every 4 (four) hours as needed for moderate pain (pain score 4-6).   OMEGA 3 PO Take 500 mg by mouth 2 (two) times daily.   VITAMIN B COMPLEX PO Take 1 capsule by mouth daily.   VITAMIN D  PO Take 1 capsule by mouth at bedtime.   zinc gluconate 50 MG tablet Take 50 mg by mouth daily.        Follow-up Information     Reyne Cordella SQUIBB, MD Follow up in 2 week(s).   Specialty: Orthopedic Surgery Contact information: [address] [place] [address] [phone]                 Signed: Cordella SQUIBB Rey[place] 12/31/2023, 9:14 AM

## 2024-02-04 ENCOUNTER — Emergency Department (HOSPITAL_COMMUNITY)

## 2024-02-04 ENCOUNTER — Other Ambulatory Visit: Payer: Self-pay

## 2024-02-04 ENCOUNTER — Encounter (HOSPITAL_COMMUNITY): Payer: Self-pay | Admitting: Emergency Medicine

## 2024-02-04 ENCOUNTER — Emergency Department (HOSPITAL_COMMUNITY)
Admission: EM | Admit: 2024-02-04 | Discharge: 2024-02-05 | Disposition: A | Discharge status: Home / Self Care | Attending: Emergency Medicine | Admitting: Emergency Medicine

## 2024-02-04 DIAGNOSIS — S52572A Other intraarticular fracture of lower end of left radius, initial encounter for closed fracture: Secondary | ICD-10-CM | POA: Insufficient documentation

## 2024-02-04 DIAGNOSIS — Y92009 Unspecified place in unspecified non-institutional (private) residence as the place of occurrence of the external cause: Secondary | ICD-10-CM | POA: Insufficient documentation

## 2024-02-04 DIAGNOSIS — W19XXXA Unspecified fall, initial encounter: Secondary | ICD-10-CM | POA: Insufficient documentation

## 2024-02-04 DIAGNOSIS — M7989 Other specified soft tissue disorders: Secondary | ICD-10-CM | POA: Diagnosis not present

## 2024-02-04 DIAGNOSIS — S52612A Displaced fracture of left ulna styloid process, initial encounter for closed fracture: Secondary | ICD-10-CM | POA: Diagnosis not present

## 2024-02-04 DIAGNOSIS — Z7982 Long term (current) use of aspirin: Secondary | ICD-10-CM | POA: Insufficient documentation

## 2024-02-04 DIAGNOSIS — S6992XA Unspecified injury of left wrist, hand and finger(s), initial encounter: Secondary | ICD-10-CM | POA: Diagnosis present

## 2024-02-04 DIAGNOSIS — S62102A Fracture of unspecified carpal bone, left wrist, initial encounter for closed fracture: Secondary | ICD-10-CM

## 2024-02-04 MED ORDER — IBUPROFEN 400 MG PO TABS: 600.0000 mg | ORAL_TABLET | Freq: Once | ORAL | Status: DC

## 2024-02-04 MED ORDER — IBUPROFEN 400 MG PO TABS
400.0000 mg | ORAL_TABLET | Freq: Once | ORAL | Status: AC
  Administered 2024-02-04: 400 mg via ORAL
  Filled 2024-02-04: qty 1

## 2024-02-04 NOTE — ED Provider Triage Note (Signed)
 Emergency Medicine Provider Triage Evaluation Note  Tricia [name] , a 78 y.o. female  was evaluated in triage.  Pt complains of left wrist deformity after FOOSH injury around 6:30 PM.  He was sent here from [facility] for reduction.  Patient was weeding outside today when she tripped over a rock and fell on her wrist.  She did not hit her head, no LOC.  Patient has strong radial pulse and is able to move her fingers.  Patient has declined strong pain medication and wishes to have ibuprofen .  Tylenol  taken around 7 PM.  Review of Systems  Positive: Pain Negative: LOC, headache  Physical Exam  BP (!) 150/74 (BP Location: Right Arm)   Pulse 74   Temp 98.5 F (36.9 C)   Resp 16   Ht 5' 2 (1.575 m)   Wt 38.6 kg   SpO2 98%   BMI 15.55 kg/m  Gen:   Awake, no distress   Resp:  Normal effort  MSK:   Left wrist with obvious deformity after FOOSH injury.  Right wrist without abnormality.  Patient has good radial pulse on the left and is able to move her fingers without difficulty.   Medical Decision Making  Medically screening exam initiated at 9:10 PM.  Appropriate orders placed.  Tricia [name] was informed that the remainder of the evaluation will be completed by another provider, this initial triage assessment does not replace that evaluation, and the importance of remaining in the ED until their evaluation is complete.  Unable to visualize imaging from [facility] that was completed earlier today.  I reordered left wrist and forearm x-ray.  Patient given ibuprofen  for pain control.   [name], [place] 02/04/24 2123

## 2024-02-04 NOTE — ED Notes (Signed)
 No answer

## 2024-02-04 NOTE — ED Triage Notes (Signed)
 Pt states that she mis-stepped on a rock and fell injuring her left wrist. Pt has obvious deformity to lt arm, Neurovascular checks WNL. Denies hitting head or LOC.

## 2024-02-04 NOTE — ED Triage Notes (Signed)
 PT had a mechanical fall at home, denies LOC, Aox 4. Family report visibly deformed.

## 2024-02-05 ENCOUNTER — Emergency Department (HOSPITAL_COMMUNITY)

## 2024-02-05 DIAGNOSIS — S52572A Other intraarticular fracture of lower end of left radius, initial encounter for closed fracture: Secondary | ICD-10-CM | POA: Diagnosis not present

## 2024-02-05 MED ORDER — ONDANSETRON HCL 4 MG/2ML IJ SOLN
4.0000 mg | Freq: Once | INTRAMUSCULAR | Status: AC
  Administered 2024-02-05: 4 mg via INTRAVENOUS
  Filled 2024-02-05: qty 2

## 2024-02-05 MED ORDER — ACETAMINOPHEN 325 MG PO TABS
650.0000 mg | ORAL_TABLET | Freq: Once | ORAL | Status: AC
  Administered 2024-02-05: 650 mg via ORAL
  Filled 2024-02-05: qty 2

## 2024-02-05 MED ORDER — LACTATED RINGERS IV BOLUS
1000.0000 mL | Freq: Once | INTRAVENOUS | Status: AC
  Administered 2024-02-05: 1000 mL via INTRAVENOUS

## 2024-02-05 MED ORDER — FENTANYL CITRATE (PF) 50 MCG/ML IJ SOSY
25.0000 ug | PREFILLED_SYRINGE | Freq: Once | INTRAMUSCULAR | Status: AC
  Administered 2024-02-05: 25 ug via INTRAVENOUS
  Filled 2024-02-05: qty 1

## 2024-02-05 MED ORDER — PROPOFOL 10 MG/ML IV BOLUS
2.0000 mg/kg | Freq: Once | INTRAVENOUS | Status: DC
  Filled 2024-02-05: qty 20

## 2024-02-05 MED ORDER — PROPOFOL 10 MG/ML IV BOLUS
INTRAVENOUS | Status: AC | PRN
  Administered 2024-02-05: 20 mg via INTRAVENOUS

## 2024-02-05 MED ORDER — OXYCODONE HCL 5 MG PO TABS: 2.5000 mg | ORAL_TABLET | ORAL | 0 refills | Status: DC | PRN

## 2024-02-05 NOTE — Progress Notes (Signed)
 RT at bedside for conscious sedation for a left wrist reduction. Dr. Lorette, RN, NT and Ortho specialist also present. Patient's vitals remained stable throughout procedure.

## 2024-02-05 NOTE — ED Notes (Addendum)
 Pt requesting tylenol  for mild pain after the procedure, Verbal order taken from Dr. Lorette.

## 2024-02-05 NOTE — Progress Notes (Signed)
 Orthopedic Tech Progress Note Patient Details:  Tricia [name] [facility] Dec 21, 1945 [id number] Sedation/ Reduction of L wrist with ED MD assisting. Sling applied for support. Ortho Devices Type of Ortho Device: Ace wrap, Cotton web roll, Arm sling, Sugartong splint Ortho Device/Splint Location: L WRIST Ortho Device/Splint Interventions: Ordered, Application, Adjustment   Post Interventions Patient Tolerated: Well Instructions Provided: Care of device  Zariana Strub L Jensyn Cambria 02/05/2024, 2:20 AM

## 2024-02-05 NOTE — ED Provider Notes (Signed)
 [place] EMERGENCY DEPARTMENT AT [facility] Provider Note   CSN: [id number] Arrival date & time: 02/04/24  2003     Patient presents with: Arm Injury   Tricia [name] is a 78 y.o. female.   presents with a chief complaint of wrist pain following a fall earlier today. She reports falling on her hands and subsequently breaking her left wrist. The incident occurred around dusk. She initially sought care at an orthopedic clinic but was referred to the emergency department due to the complexity of the fracture and the need for sedation for reduction. The patient denies any other injuries or pain elsewhere, including trauma to the chest, head, back, or legs. She has a history of hip replacement surgery less than two months ago, with no reported complications from anesthesia during that procedure. The patient has a known allergy to IV contrast, codeine, gluten, milk, and sulfa drugs. She is not on any blood thinners and denies any recent illnesses or respiratory issues. The patient is right-handed and weighs approximately 85 pounds. History was obtained from the patient and her son.   Arm Injury      Prior to Admission medications   Medication Sig Start Date End Date Taking? Authorizing Provider  oxyCODONE  (ROXICODONE ) 5 MG immediate release tablet Take 0.5 tablets (2.5 mg total) by mouth every 4 (four) hours as needed for severe pain (pain score 7-10). 02/05/24  Yes Yekaterina Escutia, Selinda, MD  aspirin  EC 325 MG tablet Take 1 tablet (325 mg total) by mouth daily with breakfast. 12/17/23   Reyne Cordella SQUIBB, MD  B Complex Vitamins (VITAMIN B COMPLEX PO) Take 1 capsule by mouth daily.    [provider]  Calcium Carbonate (CALCIUM 500 PO) Take 2 tablets by mouth daily.    [provider]  Cholecalciferol (VITAMIN D  PO) Take 1 capsule by mouth at bedtime.    [provider]  docusate sodium  (COLACE) 100 MG capsule Take 1 capsule (100 mg total) by mouth 2  (two) times daily. 12/16/23   Reyne Cordella SQUIBB, MD  HYDROcodone -acetaminophen  (NORCO/VICODIN) 5-325 MG tablet Take 1-2 tablets by mouth every 4 (four) hours as needed for moderate pain (pain score 4-6). 12/16/23   Reyne Cordella SQUIBB, MD  Omega-3 Fatty Acids (OMEGA 3 PO) Take 500 mg by mouth 2 (two) times daily.    [provider]  zinc gluconate 50 MG tablet Take 50 mg by mouth daily.    [provider]    Allergies: Codeine, Gluten meal, Milk-related compounds, Other, Sulfa antibiotics, Iodinated contrast media, and Iodine     Review of Systems  Updated Vital Signs BP 137/71   Pulse (!) 51   Temp 98 F (36.7 C) (Oral)   Resp 17   Ht 5' 2 (1.575 m)   Wt 38.6 kg   SpO2 100%   BMI 15.55 kg/m   Physical Exam Vitals and nursing note reviewed.  Constitutional:      Appearance: She is well-developed.  HENT:     Head: Normocephalic and atraumatic.  Eyes:     Pupils: Pupils are equal, round, and reactive to light.  Cardiovascular:     Rate and Rhythm: Normal rate and regular rhythm.  Pulmonary:     Effort: No respiratory distress.     Breath sounds: No stridor.  Abdominal:     General: There is no distension.  Musculoskeletal:        General: Swelling, tenderness and deformity (right wrist) present.  Cervical back: Normal range of motion.  Skin:    General: Skin is warm and dry.  Neurological:     Mental Status: She is alert.  Psychiatric:        Mood and Affect: Mood normal.     (all labs ordered are listed, but only abnormal results are displayed) Labs Reviewed - No data to display  EKG: None  Radiology: DG Wrist Complete Left Result Date: 02/05/2024 CLINICAL DATA:  Status post reduction EXAM: LEFT WRIST - COMPLETE 3+ VIEW COMPARISON:  Film from the previous day. FINDINGS: Distal radial and ulnar fractures are again identified. There remains some mild impaction although the degree of posterior displacement and angulation has reduced  significantly. Casting material is noted in place. IMPRESSION: Status post reduction of distal radial and ulnar fractures. Electronically Signed   By: Oneil Devonshire M.D.   On: 02/05/2024 02:38   DG Wrist Complete Left Result Date: 02/04/2024 CLINICAL DATA:  Clemens on outstretched hand, pain EXAM: LEFT WRIST - COMPLETE 3+ VIEW; LEFT FOREARM - 2 VIEW COMPARISON:  08/25/2014 FINDINGS: Left wrist: Frontal, oblique, and lateral views of the left wrist are obtained. There is a comminuted intra-articular distal left radial fracture with impaction and dorsal angulation. Comminuted impacted distal ulnar fracture is identified, extending from the metadiaphyseal junction through the epiphysis. The radiocarpal joint remains intact. Severe diffuse soft tissue swelling. Left forearm: Frontal and lateral views are obtained. Distal radial and ulnar fractures as above. The remainder of the radius and ulna are unremarkable. Normal alignment of the elbow. IMPRESSION: 1. Comminuted intra-articular distal left radial fracture with impaction and dorsal angulation. 2. Comminuted distal ulnar fracture, also with dorsal impaction and angulation. 3. Severe soft tissue swelling of the left wrist. Electronically Signed   By: Ozell Daring M.D.   On: 02/04/2024 22:36   DG Forearm Left Result Date: 02/04/2024 CLINICAL DATA:  Clemens on outstretched hand, pain EXAM: LEFT WRIST - COMPLETE 3+ VIEW; LEFT FOREARM - 2 VIEW COMPARISON:  08/25/2014 FINDINGS: Left wrist: Frontal, oblique, and lateral views of the left wrist are obtained. There is a comminuted intra-articular distal left radial fracture with impaction and dorsal angulation. Comminuted impacted distal ulnar fracture is identified, extending from the metadiaphyseal junction through the epiphysis. The radiocarpal joint remains intact. Severe diffuse soft tissue swelling. Left forearm: Frontal and lateral views are obtained. Distal radial and ulnar fractures as above. The remainder of the  radius and ulna are unremarkable. Normal alignment of the elbow. IMPRESSION: 1. Comminuted intra-articular distal left radial fracture with impaction and dorsal angulation. 2. Comminuted distal ulnar fracture, also with dorsal impaction and angulation. 3. Severe soft tissue swelling of the left wrist. Electronically Signed   By: Ozell Daring M.D.   On: 02/04/2024 22:36     .Sedation  Date/Time: 02/05/2024 1:29 AM  Performed by: Lorette Mayo, MD Authorized by: Lorette Mayo, MD   Consent:    Consent obtained:  Verbal and written   Consent given by:  Patient   Risks discussed:  Allergic reaction, dysrhythmia, inadequate sedation, nausea, prolonged hypoxia resulting in organ damage, prolonged sedation necessitating reversal, respiratory compromise necessitating ventilatory assistance and intubation and vomiting   Alternatives discussed:  Analgesia without sedation, anxiolysis and regional anesthesia Universal protocol:    Procedure explained and questions answered to patient or proxy's satisfaction: yes     Relevant documents present and verified: yes     Test results available: yes     Imaging studies available: yes  Required blood products, implants, devices, and special equipment available: yes     Site/side marked: yes     Immediately prior to procedure, a time out was called: yes     Patient identity confirmed:  Verbally with patient, arm band and hospital-assigned identification number Indications:    Procedure performed:  Fracture reduction   Procedure necessitating sedation performed by:  Physician performing sedation Pre-sedation assessment:    Time since last food or drink:  >4 hours   ASA classification: class 2 - patient with mild systemic disease     Mouth opening:  3 or more finger widths   Thyromental distance:  4 finger widths   Mallampati score:  II - soft palate, uvula, fauces visible   Neck mobility: normal     Pre-sedation assessments completed and reviewed:  airway patency, cardiovascular function, hydration status, mental status, nausea/vomiting, pain level, respiratory function and temperature   A pre-sedation assessment was completed prior to the start of the procedure Immediate pre-procedure details:    Reassessment: Patient reassessed immediately prior to procedure     Reviewed: vital signs, relevant labs/tests and NPO status     Verified: bag valve mask available, emergency equipment available, intubation equipment available, IV patency confirmed, oxygen available and suction available   Procedure details (see MAR for exact dosages):    Preoxygenation:  Nasal cannula   Sedation:  Propofol    Intended level of sedation: deep   Intra-procedure monitoring:  Blood pressure monitoring, cardiac monitor, continuous pulse oximetry, frequent LOC assessments, frequent vital sign checks and continuous capnometry   Intra-procedure events: none     Total Provider sedation time (minutes):  18 Post-procedure details:   A post-sedation assessment was completed following the completion of the procedure.   Attendance: Constant attendance by certified staff until patient recovered     Recovery: Patient returned to pre-procedure baseline     Post-sedation assessments completed and reviewed: airway patency, cardiovascular function, hydration status, mental status, nausea/vomiting, pain level, respiratory function and temperature     Patient is stable for discharge or admission: yes     Procedure completion:  Tolerated well, no immediate complications .Splint Application  Date/Time: 02/05/2024 7:41 AM  Performed by: Lorette Mayo, MD Authorized by: Lorette Mayo, MD   Consent:    Consent obtained:  Verbal and written   Consent given by:  Patient   Risks discussed:  Discoloration, numbness, pain and swelling   Alternatives discussed:  No treatment, alternative treatment and delayed treatment Universal protocol:    Procedure explained and questions answered  to patient or proxy's satisfaction: yes     Relevant documents present and verified: yes     Test results available: yes     Imaging studies available: yes     Required blood products, implants, devices, and special equipment available: yes     Patient identity confirmed:  Verbally with patient Pre-procedure details:    Distal neurologic exam:  Normal   Distal perfusion: distal pulses strong   Procedure details:    Location:  Arm   Arm location:  L lower arm   Cast type:  Short arm   Splint type:  Sugar tong   Supplies:  Plaster Post-procedure details:    Distal neurologic exam:  Normal   Distal perfusion: distal pulses strong     Procedure completion:  Tolerated well, no immediate complications   Post-procedure imaging: reviewed   Comments:     Assisted ortho tech with splint application, molded by myself.  SABRA  Ortho Injury Treatment  Date/Time: 02/05/2024 7:43 AM  Performed by: Lorette Mayo, MD Authorized by: Lorette Mayo, MD   Consent:    Consent obtained:  Verbal and written   Consent given by:  Patient   Risks discussed:  Fracture, nerve damage, vascular damage, restricted joint movement, recurrent dislocation, stiffness and irreducible dislocation   Alternatives discussed:  No treatment, alternative treatment, immobilization, referral and delayed treatmentInjury location: wrist Location details: left wrist Injury type: fracture Fracture type: distal radius and ulnar styloid Pre-procedure neurovascular assessment: neurovascularly intact Pre-procedure distal perfusion: normal Pre-procedure neurological function: normal Pre-procedure range of motion: reduced  Anesthesia: Local anesthesia used: no  Patient sedated: Yes. Refer to sedation procedure documentation for details of sedation. Manipulation performed: yes Skin traction used: no Skeletal traction used: no Reduction successful: yes X-ray confirmed reduction: yes Immobilization: splint Splint type: sugar  tong Splint Applied by: ED Provider and Ortho Tech Supplies used: plaster and cotton padding Post-procedure neurovascular assessment: post-procedure neurovascularly intact Post-procedure distal perfusion: normal Post-procedure neurological function: normal Post-procedure range of motion: unchanged Post-procedure range of motion comment: splinted so has no significant ROM of wrist, fingers were normal      Medications Ordered in the ED  propofol  (DIPRIVAN ) 10 mg/mL bolus/IV push 77.2 mg (77.2 mg Intravenous Not Given 02/05/24 0210)  ibuprofen  (ADVIL ) tablet 400 mg (400 mg Oral Given 02/04/24 2130)  fentaNYL  (SUBLIMAZE ) injection 25 mcg (25 mcg Intravenous Given 02/05/24 0139)  lactated ringers  bolus 1,000 mL (0 mLs Intravenous Stopped 02/05/24 0328)  ondansetron  (ZOFRAN ) injection 4 mg (4 mg Intravenous Given 02/05/24 0138)  propofol  (DIPRIVAN ) 10 mg/mL bolus/IV push (20 mg Intravenous Given 02/05/24 0156)  propofol  (DIPRIVAN ) 10 mg/mL bolus/IV push (20 mg Intravenous Given 02/05/24 0157)  acetaminophen  (TYLENOL ) tablet 650 mg (650 mg Oral Given 02/05/24 0402)                                    Medical Decision Making Amount and/or Complexity of Data Reviewed Radiology: ordered.  Risk OTC drugs. Prescription drug management.   The patient presented to the emergency department after being referred by an orthopedic PA due to a wrist fracture sustained from a fall earlier in the day. The patient was evaluated for potential reduction under sedation due to the complexity of the fracture and the time elapsed since the injury. The patient has a history of hip replacement surgery two months ago and reported no issues with anesthesia during that procedure. The patient has multiple allergies, including to codeine, gluten, milk, sulfa, and contrast, but not to iodine . The plan includes a reduction of the wrist fracture under conscious sedation using propofol , with fentanyl  administered for pain  management. The patient will be monitored closely for any adverse reactions during and after the procedure, and a splint will be applied post-reduction. The patient will be discharged with instructions to follow up with orthopedics for further evaluation and treatment.  Differential Diagnosis: Differential diagnosis includes but is not limited to: distal radius fracture, scaphoid fracture, wrist sprain, ligamentous injury, and carpal bone fracture.  Pre and postreduction x-rays viewed interpreted by myself and significant interval improvement in alignment although not on that anatomic.  Splinted as above.  Woke up from sedation and was observed for couple hours without any recurrent nausea, vomiting, sedation or vital sign abnormalities.  There is no issues during sedation.  Will follow-up with Dr. Louanne (per patient request) for further  management and follow-up.   Final diagnoses:  Closed fracture of left wrist, initial encounter    ED Discharge Orders          Ordered    oxyCODONE  (ROXICODONE ) 5 MG immediate release tablet  Every 4 hours PRN        02/05/24 0446    Home Health        02/05/24 0448    Face-to-face encounter (required for Medicare/Medicaid patients)       Comments: I Selinda Sias certify that this patient is under my care and that I, or a nurse practitioner or physician's assistant working with me, had a face-to-face encounter that meets the physician face-to-face encounter requirements with this patient on 02/05/2024. The encounter with the patient was in whole, or in part for the following medical condition(s) which is the primary reason for home health care (List medical condition): new wrist fracture, previous hip fracture, multiple falls. Further orders per pcp   02/05/24 0448               Karsen Fellows, Selinda, MD 02/05/24 [phone]

## 2024-03-07 ENCOUNTER — Telehealth: Payer: Self-pay

## 2024-03-07 ENCOUNTER — Other Ambulatory Visit: Payer: Self-pay

## 2024-03-07 ENCOUNTER — Ambulatory Visit: Admitting: Family Medicine

## 2024-03-07 ENCOUNTER — Encounter: Payer: Self-pay | Admitting: Family Medicine

## 2024-03-07 VITALS — BP 126/78 | HR 65 | Temp 98.2°F | Ht 62.0 in | Wt 85.8 lb

## 2024-03-07 DIAGNOSIS — D508 Other iron deficiency anemias: Secondary | ICD-10-CM

## 2024-03-07 DIAGNOSIS — S62102A Fracture of unspecified carpal bone, left wrist, initial encounter for closed fracture: Secondary | ICD-10-CM

## 2024-03-07 DIAGNOSIS — Z1283 Encounter for screening for malignant neoplasm of skin: Secondary | ICD-10-CM | POA: Diagnosis not present

## 2024-03-07 DIAGNOSIS — E673 Hypervitaminosis D: Secondary | ICD-10-CM

## 2024-03-07 DIAGNOSIS — I10 Essential (primary) hypertension: Secondary | ICD-10-CM

## 2024-03-07 DIAGNOSIS — E782 Mixed hyperlipidemia: Secondary | ICD-10-CM

## 2024-03-07 DIAGNOSIS — Z79899 Other long term (current) drug therapy: Secondary | ICD-10-CM | POA: Diagnosis not present

## 2024-03-07 DIAGNOSIS — S62102S Fracture of unspecified carpal bone, left wrist, sequela: Secondary | ICD-10-CM

## 2024-03-07 DIAGNOSIS — M8000XD Age-related osteoporosis with current pathological fracture, unspecified site, subsequent encounter for fracture with routine healing: Secondary | ICD-10-CM

## 2024-03-07 LAB — COMPREHENSIVE METABOLIC PANEL WITH GFR
ALT: 13 U/L (ref 0–35)
AST: 23 U/L (ref 0–37)
Albumin: 4.6 g/dL (ref 3.5–5.2)
Alkaline Phosphatase: 111 U/L (ref 39–117)
BUN: 12 mg/dL (ref 6–23)
CO2: 31 meq/L (ref 19–32)
Calcium: 9.8 mg/dL (ref 8.4–10.5)
Chloride: 104 meq/L (ref 96–112)
Creatinine, Ser: 0.8 mg/dL (ref 0.40–1.20)
GFR: 70.57 mL/min (ref >60.00)
Glucose, Bld: 73 mg/dL (ref 70–99)
Potassium: 3.5 meq/L (ref 3.5–5.1)
Sodium: 141 meq/L (ref 135–145)
Total Bilirubin: 0.6 mg/dL (ref 0.2–1.2)
Total Protein: 6.6 g/dL (ref 6.0–8.3)

## 2024-03-07 LAB — CBC WITH DIFFERENTIAL/PLATELET
Basophils Absolute: 0 K/uL (ref 0.0–0.1)
Basophils Relative: 0.9 % (ref 0.0–3.0)
Eosinophils Absolute: 0.1 K/uL (ref 0.0–0.7)
Eosinophils Relative: 1.8 % (ref 0.0–5.0)
HCT: 34.6 % — ABNORMAL LOW (ref 36.0–46.0)
Hemoglobin: 12 g/dL (ref 12.0–15.0)
Lymphocytes Relative: 21.4 % (ref 12.0–46.0)
Lymphs Abs: 0.8 K/uL (ref 0.7–4.0)
MCHC: 34.7 g/dL (ref 30.0–36.0)
MCV: 96.1 fl (ref 78.0–100.0)
Monocytes Absolute: 0.4 K/uL (ref 0.1–1.0)
Monocytes Relative: 11.3 % (ref 3.0–12.0)
Neutro Abs: 2.4 K/uL (ref 1.4–7.7)
Neutrophils Relative %: 64.6 % (ref 43.0–77.0)
Platelets: 188 K/uL (ref 150.0–400.0)
RBC: 3.6 Mil/uL — ABNORMAL LOW (ref 3.87–5.11)
RDW: 12.4 % (ref 11.5–15.5)
WBC: 3.7 K/uL — ABNORMAL LOW (ref 4.0–10.5)

## 2024-03-07 LAB — TSH: TSH: 2.11 u[IU]/mL (ref 0.35–5.50)

## 2024-03-07 LAB — VITAMIN B12: Vitamin B-12: 288 pg/mL (ref 211–911)

## 2024-03-07 LAB — VITAMIN D 25 HYDROXY (VIT D DEFICIENCY, FRACTURES): VITD: >120 ng/mL

## 2024-03-07 NOTE — Progress Notes (Signed)
 New Patient Visit  Subjective:     Patient ID: Tricia [name], female    DOB: 1945/10/04, 78 y.o.   MRN: [id number]  Chief Complaint  Patient presents with   Establish Care    HPI  Discussed the use of AI scribe software for clinical note transcription with the patient, who gave verbal consent to proceed.  History of Present Illness Mrs. Tricia [name] is a 78 year old female who presents for follow-up after a recent wrist fracture and ongoing issues with chronic fatigue.  Wrist fracture and functional impairment - Sustained a wrist fracture after stepping on a rock in her yard - Currently has a cast on her wrist - Significant impact on ability to perform daily activities  History of hip fracture - Underwent surgery for a hip fracture following a fall in [place]   Chronic fatigue - Persistent fatigue - Allergy testing revealed sensitivities to wheat and mold - Explored alternative medicine approaches, including functional and Chinese medicine - Underwent various alternative diagnostic tests - Regularly takes vitamin D3 and B vitamins, including B12 - Significant psychosocial stress related to husband's developing dementia and conflict with his son, which may contribute to fatigue  Cutaneous findings and hematologic concerns - Concern for low hemoglobin - Dark red spots present on skin  Ear congestion - Experiences stopped-up ears, particularly at night     ROS Per HPI  Outpatient Encounter Medications as of 03/07/2024  Medication Sig   aspirin  EC 325 MG tablet Take 1 tablet (325 mg total) by mouth daily with breakfast.   B Complex Vitamins (VITAMIN B COMPLEX PO) Take 1 capsule by mouth daily.   Calcium Carbonate (CALCIUM 500 PO) Take 2 tablets by mouth daily.   Cholecalciferol (VITAMIN D  PO) Take 1 capsule by mouth at bedtime.   docusate sodium  (COLACE) 100 MG capsule Take 1 capsule (100 mg total) by mouth 2 (two) times daily.    HYDROcodone -acetaminophen  (NORCO/VICODIN) 5-325 MG tablet Take 1-2 tablets by mouth every 4 (four) hours as needed for moderate pain (pain score 4-6).   Omega-3 Fatty Acids (OMEGA 3 PO) Take 500 mg by mouth 2 (two) times daily.   oxyCODONE  (ROXICODONE ) 5 MG immediate release tablet Take 0.5 tablets (2.5 mg total) by mouth every 4 (four) hours as needed for severe pain (pain score 7-10).   zinc gluconate 50 MG tablet Take 50 mg by mouth daily.   No facility-administered encounter medications on file as of 03/07/2024.    Past Medical History:  Diagnosis Date   Proximal humerus fracture 01/20/2019   Tachycardia    Related to Epinephrine used for dental procedure    Past Surgical History:  Procedure Laterality Date   colonscopy     HIP ARTHROPLASTY Right 12/15/2023   Procedure: HEMIARTHROPLASTY (BIPOLAR) HIP, POSTERIOR APPROACH FOR FRACTURE;  Surgeon: Reyne Cordella SQUIBB, MD;  Location: [facility];  Service: Orthopedics;  Laterality: Right;   ORIF HUMERUS FRACTURE Right 01/20/2019   Procedure: OPEN REDUCTION INTERNAL FIXATION (ORIF) PROXIMAL HUMERUS FRACTURE;  Surgeon: Melita Drivers, MD;  Location: [facility];  Service: Orthopedics;  Laterality: Right;    OVARIAN CYST REMOVAL  1973    Family History  Problem Relation Age of Onset   Leukemia Mother    Goiter Mother    Stroke Father    Cancer Father    Heart attack Father    Heart disease Brother    Parkinsonism Brother     Social History   Socioeconomic History  Marital status: Married    Spouse name: Not on file   Number of children: 1   Years of education: Not on file   Highest education level: Not on file  Occupational History   Not on file  Tobacco Use   Smoking status: Former    Current packs/day: 0.00    Types: Cigarettes    Quit date: 38    Years since quitting: 48.9   Smokeless tobacco: Never   Tobacco comments:    Smoked cigarettes when she was younger   Advertising Account Planner   Vaping status: Never Used  Substance and  Sexual Activity   Alcohol use: No   Drug use: No   Sexual activity: Not Currently  Other Topics Concern   Not on file  Social History Narrative   Not on file   Social Drivers of Health   Financial Resource Strain: Not on file  Food Insecurity: No Food Insecurity (12/14/2023)   Hunger Vital Sign    Worried About Running Out of Food in the Last Year: Never true    Ran Out of Food in the Last Year: Never true  Transportation Needs: No Transportation Needs (12/14/2023)   PRAPARE - Administrator, Civil Service (Medical): No    Lack of Transportation (Non-Medical): No  Physical Activity: Not on file  Stress: Not on file  Social Connections: Moderately Integrated (12/14/2023)   Social Connection and Isolation Panel    Frequency of Communication with Friends and Family: More than three times a week    Frequency of Social Gatherings with Friends and Family: More than three times a week    Attends Religious Services: Patient unable to answer    Active Member of Clubs or Organizations: Patient declined    Attends Engineer, Structural: More than 4 times per year    Marital Status: Married  Catering Manager Violence: Not At Risk (12/14/2023)   Humiliation, Afraid, Rape, and Kick questionnaire    Fear of Current or Ex-Partner: No    Emotionally Abused: No    Physically Abused: No    Sexually Abused: No       Objective:    BP 126/78 (BP Location: Left Arm, Patient Position: Sitting)   Pulse 65   Temp 98.2 F (36.8 C) (Temporal)   Ht 5' 2 (1.575 m)   Wt 85 lb 12.8 oz (38.9 kg)   SpO2 97%   BMI 15.69 kg/m    Physical Exam Vitals and nursing note reviewed.  Constitutional:      General: She is not in acute distress.    Appearance: Normal appearance.  HENT:     Head: Normocephalic and atraumatic.     Right Ear: External ear normal.     Left Ear: External ear normal.     Nose: Nose normal.     Mouth/Throat:     Mouth: Mucous membranes are moist.     Pharynx:  Oropharynx is clear.  Eyes:     Extraocular Movements: Extraocular movements intact.     Pupils: Pupils are equal, round, and reactive to light.  Neck:     Vascular: No carotid bruit.  Cardiovascular:     Rate and Rhythm: Normal rate and regular rhythm.     Pulses: Normal pulses.     Heart sounds: Normal heart sounds.  Pulmonary:     Effort: Pulmonary effort is normal. No respiratory distress.     Breath sounds: Normal breath sounds. No wheezing, rhonchi or rales.  Musculoskeletal:     Cervical back: Normal range of motion.     Right lower leg: No edema.     Left lower leg: No edema.     Comments: Left wrist in plaster cast, left arm in sling. LROM to the area  Lymphadenopathy:     Cervical: No cervical adenopathy.  Neurological:     General: No focal deficit present.     Mental Status: She is alert and oriented to person, place, and time.  Psychiatric:        Mood and Affect: Mood normal.        Thought Content: Thought content normal.     Results for orders placed or performed in visit on 03/07/24  CBC with Differential/Platelet  Result Value Ref Range   WBC 3.7 (L) 4.0 - 10.5 K/uL   RBC 3.60 (L) 3.87 - 5.11 Mil/uL   Hemoglobin 12.0 12.0 - 15.0 g/dL   HCT 65.3 (L) 63.9 - 53.9 %   MCV 96.1 78.0 - 100.0 fl   MCHC 34.7 30.0 - 36.0 g/dL   RDW 87.5 88.4 - 84.4 %   Platelets 188.0 150.0 - 400.0 K/uL   Neutrophils Relative % 64.6 43.0 - 77.0 %   Lymphocytes Relative 21.4 12.0 - 46.0 %   Monocytes Relative 11.3 3.0 - 12.0 %   Eosinophils Relative 1.8 0.0 - 5.0 %   Basophils Relative 0.9 0.0 - 3.0 %   Neutro Abs 2.4 1.4 - 7.7 K/uL   Lymphs Abs 0.8 0.7 - 4.0 K/uL   Monocytes Absolute 0.4 0.1 - 1.0 K/uL   Eosinophils Absolute 0.1 0.0 - 0.7 K/uL   Basophils Absolute 0.0 0.0 - 0.1 K/uL  Comprehensive metabolic panel with GFR  Result Value Ref Range   Sodium 141 135 - 145 mEq/L   Potassium 3.5 3.5 - 5.1 mEq/L   Chloride 104 96 - 112 mEq/L   CO2 31 19 - 32 mEq/L   Glucose,  Bld 73 70 - 99 mg/dL   BUN 12 6 - 23 mg/dL   Creatinine, Ser 9.19 0.40 - 1.20 mg/dL   Total Bilirubin 0.6 0.2 - 1.2 mg/dL   Alkaline Phosphatase 111 39 - 117 U/L   AST 23 0 - 37 U/L   ALT 13 0 - 35 U/L   Total Protein 6.6 6.0 - 8.3 g/dL   Albumin 4.6 3.5 - 5.2 g/dL   GFR 29.42 >39.99 mL/min   Calcium 9.8 8.4 - 10.5 mg/dL  VITAMIN D  25 Hydroxy (Vit-D Deficiency, Fractures)  Result Value Ref Range   VITD >120 ng/mL  Vitamin B12  Result Value Ref Range   Vitamin B-12 288 211 - 911 pg/mL  TSH  Result Value Ref Range   TSH 2.11 0.35 - 5.50 uIU/mL        Assessment & Plan:   Assessment and Plan Assessment & Plan Left wrist fracture Fracture managed with casting. Pain controlled with Tylenol . - Plan temporary cast next week for showering. - Use Tylenol  as needed for pain. - F/u with ortho as scheduled  Age-related osteoporosis with current pathological fracture Osteoporosis contributing to fractures.  Other iron deficiency anemia Low hemoglobin with possible skin manifestations. Regular vitamin D3 and B12 supplementation. - Ordered lab tests for hemoglobin, platelets, vitamin D , and B12 levels. - Continue vitamin D3 and B12 supplementation.  Encounter for screening for malignant neoplasm of skin No prior skin checks. Dermatology referral recommended. - Referred to dermatologist for skin screening.  Medication Management -  labs today, will dose adjust medications as needed     Orders Placed This Encounter  Procedures   CBC with Differential/Platelet    Release to patient:   Immediate [1]   Comprehensive metabolic panel with GFR    Release to patient:   Immediate [1]   VITAMIN D  25 Hydroxy (Vit-D Deficiency, Fractures)   Vitamin B12   TSH   Ambulatory referral to Dermatology    Referral Priority:   Routine    Referral Type:   Consultation    Referral Reason:   Specialty Services Required    Requested Specialty:   Dermatology    Number of Visits Requested:   1      No orders of the defined types were placed in this encounter.   Return in about 6 months (around 09/04/2024) for meds OV.  Corean LITTIE Ku, FNP

## 2024-03-07 NOTE — Telephone Encounter (Signed)
 CRITICAL VALUE STICKER  CRITICAL VALUE: Vitamin D  >120  RECEIVER (on-site recipient of call): Sally-Anne Wamble Stubbs-Barnette  DATE & TIME NOTIFIED: 03/07/24 at 11:57  MESSENGER (representative from lab): Rea  MD NOTIFIED: Corean Ku, FNP  TIME OF NOTIFICATION: 11:58  RESPONSE:  Awaiting response from PCP

## 2024-03-07 NOTE — Patient Instructions (Addendum)
 Welcome to [facility]!  Thank you for choosing us  for your Primary Care needs.   We offer in person and video appointments for your convenience. You may call our office to schedule appointments, or you may schedule appointments with me through MyChart.   The best way to get in contact with me is via MyChart message. This will get to me faster than a phone call, unless there is an emergency, then please call 911.  The lab is located downstairs in the Sports Medicine building, we also have xray available there.   We are checking labs today, will be in contact with any results that require further attention.  Follow-up with me in 6 mos for medication management, sooner if needed.

## 2024-03-07 NOTE — Telephone Encounter (Signed)
 Lab recheck scheduled

## 2024-03-07 NOTE — Telephone Encounter (Signed)
 Spoke with patient, lab recheck sent

## 2024-03-09 ENCOUNTER — Ambulatory Visit: Payer: Self-pay | Admitting: Family Medicine

## 2024-04-12 ENCOUNTER — Telehealth: Payer: Self-pay

## 2024-04-12 ENCOUNTER — Other Ambulatory Visit (INDEPENDENT_AMBULATORY_CARE_PROVIDER_SITE_OTHER)

## 2024-04-12 ENCOUNTER — Ambulatory Visit

## 2024-04-12 VITALS — BP 102/70 | HR 73 | Ht 62.0 in | Wt 84.4 lb

## 2024-04-12 DIAGNOSIS — E673 Hypervitaminosis D: Secondary | ICD-10-CM

## 2024-04-12 DIAGNOSIS — Z0000 Encounter for general adult medical examination without abnormal findings: Secondary | ICD-10-CM | POA: Diagnosis not present

## 2024-04-12 LAB — VITAMIN D 25 HYDROXY (VIT D DEFICIENCY, FRACTURES): VITD: 104.84 ng/mL (ref 30.00–100.00)

## 2024-04-12 NOTE — Telephone Encounter (Signed)
 CRITICAL VALUE STICKER  CRITICAL VALUE: vitamin d  104.84  RECEIVER (on-site recipient of call): Miosotis Wetsel  DATE & TIME NOTIFIED: 04/12/24 at 2:17 pm  MESSENGER (representative from lab): Saa

## 2024-04-12 NOTE — Progress Notes (Signed)
 "  Chief Complaint  Patient presents with   Medicare Wellness     Subjective:   Tricia [name] is a 78 y.o. female who presents for a Medicare Annual Wellness Visit.  Visit info / Clinical Intake: Medicare Wellness Visit Type:: Subsequent Annual Wellness Visit Persons participating in visit and providing information:: patient Medicare Wellness Visit Mode:: In-person (required for WTM) Interpreter Needed?: No Pre-visit prep was completed: yes AWV questionnaire completed by patient prior to visit?: no Living arrangements:: lives with spouse/significant other Patient's Overall Health Status Rating: good Typical amount of pain: some (left wrist/right hip) Does pain affect daily life?: (!) yes Are you currently prescribed opioids?: no  Dietary Habits and Nutritional Risks How many meals a day?: 3 Eats fruit and vegetables daily?: yes Most meals are obtained by: preparing own meals In the last 2 weeks, have you had any of the following?: none Diabetic:: no  Functional Status Activities of Daily Living (to include ambulation/medication): Independent Ambulation: Independent with device- listed below Home Assistive Devices/Equipment: Eyeglasses; Brace (specify type); Cane Medication Administration: Independent Home Management (perform basic housework or laundry): Independent Manage your own finances?: yes Primary transportation is: driving Concerns about vision?: no *vision screening is required for WTM* Concerns about hearing?: no  Fall Screening Falls in the past year?: 1 Number of falls in past year: 1 (2) Was there an injury with Fall?: 1 (right hip fracture; left wrist fracture) Fall Risk Category Calculator: 3 Patient Fall Risk Level: High Fall Risk  Fall Risk Patient at Risk for Falls Due to: Impaired balance/gait Fall risk Follow up: Falls evaluation completed; Falls prevention discussed  Home and Transportation Safety: All rugs have non-skid backing?: N/A,  no rugs All stairs or steps have railings?: yes (outside) Grab bars in the bathtub or shower?: (!) no Have non-skid surface in bathtub or shower?: yes Good home lighting?: yes Regular seat belt use?: yes Hospital stays in the last year:: (!) yes How many hospital stays:: 7 Reason: right hip surgery  Cognitive Assessment Difficulty concentrating, remembering, or making decisions? : no Will 6CIT or Mini Cog be Completed: yes What year is it?: 0 points What month is it?: 0 points Give patient an address phrase to remember (5 components): [address] [place] About what time is it?: 0 points Count backwards from 20 to 1: 2 points (2) Say the months of the year in reverse: 0 points Repeat the address phrase from earlier: 2 points ([address]) 6 CIT Score: 4 points  Advance Directives (For Healthcare) Does Patient Have a Medical Advance Directive?: Yes Does patient want to make changes to medical advance directive?: Yes (Inpatient - patient requests chaplain consult to change a medical advance directive) Type of Advance Directive: Healthcare Power of [place]; Living will Copy of Healthcare Power of Attorney in Chart?: No - copy requested Copy of Living Will in Chart?: No - copy requested Would patient like information on creating a medical advance directive?: No - Patient declined  Reviewed/Updated  Reviewed/Updated: Reviewed All (Medical, Surgical, Family, Medications, Allergies, Care Teams, Patient Goals)    Allergies (verified) Codeine, Gluten meal, Milk-related compounds, Other, Sulfa antibiotics, Iodinated contrast media, and Iodine    Current Medications (verified) Outpatient Encounter Medications as of 04/12/2024  Medication Sig   acetaminophen  (TYLENOL ) 500 MG tablet Take 500 mg by mouth every 6 (six) hours as needed for moderate pain (pain score 4-6).   aspirin  EC 325 MG tablet Take 1 tablet (325 mg total) by mouth daily with  breakfast.   B Complex Vitamins  (VITAMIN B COMPLEX PO) Take 1 capsule by mouth daily.   Calcium Carbonate (CALCIUM 500 PO) Take 2 tablets by mouth daily.   Cholecalciferol (VITAMIN D  PO) Take 1 capsule by mouth at bedtime.   docusate sodium  (COLACE) 100 MG capsule Take 1 capsule (100 mg total) by mouth 2 (two) times daily.   Omega-3 Fatty Acids (OMEGA 3 PO) Take 500 mg by mouth 2 (two) times daily.   zinc gluconate 50 MG tablet Take 50 mg by mouth daily.   HYDROcodone -acetaminophen  (NORCO/VICODIN) 5-325 MG tablet Take 1-2 tablets by mouth every 4 (four) hours as needed for moderate pain (pain score 4-6). (Patient not taking: Reported on 04/12/2024)   oxyCODONE  (ROXICODONE ) 5 MG immediate release tablet Take 0.5 tablets (2.5 mg total) by mouth every 4 (four) hours as needed for severe pain (pain score 7-10). (Patient not taking: Reported on 04/12/2024)   No facility-administered encounter medications on file as of 04/12/2024.    History: Past Medical History:  Diagnosis Date   Proximal humerus fracture 01/20/2019   Tachycardia    Related to Epinephrine used for dental procedure   Past Surgical History:  Procedure Laterality Date   colonscopy     HIP ARTHROPLASTY Right 12/15/2023   Procedure: HEMIARTHROPLASTY (BIPOLAR) HIP, POSTERIOR APPROACH FOR FRACTURE;  Surgeon: Reyne Cordella SQUIBB, MD;  Location: [facility];  Service: Orthopedics;  Laterality: Right;   ORIF HUMERUS FRACTURE Right 01/20/2019   Procedure: OPEN REDUCTION INTERNAL FIXATION (ORIF) PROXIMAL HUMERUS FRACTURE;  Surgeon: Melita Drivers, MD;  Location: [facility];  Service: Orthopedics;  Laterality: Right;    OVARIAN CYST REMOVAL  1973   Family History  Problem Relation Age of Onset   Leukemia Mother    Goiter Mother    Stroke Father    Cancer Father    Heart attack Father    Heart disease Brother    Parkinsonism Brother    Social History   Occupational History   Not on file  Tobacco Use   Smoking status: Former    Current packs/day: 0.00    Average  packs/day: 0.3 packs/day    Types: Cigarettes    Quit date: 1977    Years since quitting: 49.0   Smokeless tobacco: Never   Tobacco comments:    Smoked cigarettes when she was younger   Advertising Account Planner   Vaping status: Never Used  Substance and Sexual Activity   Alcohol use: No   Drug use: No   Sexual activity: Not Currently   Tobacco Counseling Counseling given: Yes Tobacco comments: Smoked cigarettes when she was younger   SDOH Screenings   Food Insecurity: No Food Insecurity (04/12/2024)  Housing: Unknown (04/12/2024)  Transportation Needs: No Transportation Needs (04/12/2024)  Utilities: Not At Risk (04/12/2024)  Depression (PHQ2-9): Low Risk (04/12/2024)  Physical Activity: Inactive (04/12/2024)  Social Connections: Moderately Integrated (04/12/2024)  Stress: No Stress Concern Present (04/12/2024)  Tobacco Use: Medium Risk (04/12/2024)  Health Literacy: Adequate Health Literacy (04/12/2024)   See flowsheets for full screening details  Depression Screen PHQ 2 & 9 Depression Scale- Over the past 2 weeks, how often have you been bothered by any of the following problems? Little interest or pleasure in doing things: 0 Feeling down, depressed, or hopeless (PHQ Adolescent also includes...irritable): 0 PHQ-2 Total Score: 0 Trouble falling or staying asleep, or sleeping too much: 0 Feeling tired or having little energy: 0 Poor appetite or overeating (PHQ Adolescent also includes...weight loss): 0 Feeling  bad about yourself - or that you are a failure or have let yourself or your family down: 0 Trouble concentrating on things, such as reading the newspaper or watching television (PHQ Adolescent also includes...like school work): 0 Moving or speaking so slowly that other people could have noticed. Or the opposite - being so fidgety or restless that you have been moving around a lot more than usual: 0 Thoughts that you would be better off dead, or of hurting yourself in some way:  0 PHQ-9 Total Score: 0 If you checked off any problems, how difficult have these problems made it for you to do your work, take care of things at home, or get along with other people?: Not difficult at all  Depression Treatment Depression Interventions/Treatment : EYV7-0 Score <4 Follow-up Not Indicated     Goals Addressed               This Visit's Progress     Patient Stated (pt-stated)        Patient stated she plans to continue walking and trying not to fall             Objective:    Today's Vitals   04/12/24 1108  BP: 102/70  Pulse: 73  SpO2: 98%  Weight: 84 lb 6.4 oz (38.3 kg)  Height: 5' 2 (1.575 m)   Body mass index is 15.44 kg/m.  Hearing/Vision screen Hearing Screening - Comments:: Denies hearing difficulties   Vision Screening - Comments:: Wears rx glasses - up to date with routine eye exams with an Optometrist from out of town.  Given info to [facility]  Immunizations and Health Maintenance Health Maintenance  Topic Date Due   COVID-19 Vaccine (1) Never done   DTaP/Tdap/Td (1 - Tdap) Never done   Zoster Vaccines- Shingrix (1 of 2) Never done   Pneumococcal Vaccine: 50+ Years (1 of 1 - PCV) Never done   Medicare Annual Wellness (AWV)  04/12/2025   Bone Density Scan  Completed   Meningococcal B Vaccine  Aged Out   Influenza Vaccine  Discontinued   Hepatitis C Screening  Discontinued   Fecal DNA (Cologuard)  Discontinued        Assessment/Plan:  This is a routine wellness examination for Kennedie.  I have recommended that this patient have a immunization for Influenza, Tdap, Pneumonia, and Shingles but she declines at this time. I have discussed the risks and benefits of this procedure with her. The patient verbalizes understanding.  Patient Care Team: Alvia Corean CROME, FNP as PCP - General (Family Medicine) Estelle Service, MD as Consulting Physician (Obstetrics and Gynecology) Luis Purchase, MD as Consulting Physician  (Gastroenterology)  I have personally reviewed and noted the following in the patients chart:   Medical and social history Use of alcohol, tobacco or illicit drugs  Current medications and supplements including opioid prescriptions. Functional ability and status Nutritional status Physical activity Advanced directives List of other physicians Hospitalizations, surgeries, and ER visits in previous 12 months Vitals Screenings to include cognitive, depression, and falls Referrals and appointments  No orders of the defined types were placed in this encounter.  In addition, I have reviewed and discussed with patient certain preventive protocols, quality metrics, and best practice recommendations. A written personalized care plan for preventive services as well as general preventive health recommendations were provided to patient.   Verdie CHRISTELLA Saba, CMA   04/12/2024   Return in 1 year (on 04/12/2025).  After Visit Summary: (In Person-Declined) Patient  declined AVS at this time.  Nurse Notes: scheduled a CPE w/PCP for 06/2024 "

## 2024-04-12 NOTE — Patient Instructions (Signed)
 Mrs. Hypolite,  Thank you for taking the time for your Medicare Wellness Visit. I appreciate your continued commitment to your health goals. Please review the care plan we discussed, and feel free to reach out if I can assist you further.  Please note that Annual Wellness Visits do not include a physical exam. Some assessments may be limited, especially if the visit was conducted virtually. If needed, we may recommend an in-person follow-up with your provider.  Ongoing Care Seeing your primary care provider every 3 to 6 months helps us  monitor your health and provide consistent, personalized care.   Referrals If a referral was made during today's visit and you haven't received any updates within two weeks, please contact the referred provider directly to check on the status.  Recommended Screenings:  Health Maintenance  Topic Date Due   COVID-19 Vaccine (1) Never done   DTaP/Tdap/Td vaccine (1 - Tdap) Never done   Zoster (Shingles) Vaccine (1 of 2) Never done   Pneumococcal Vaccine for age over 34 (1 of 1 - PCV) Never done   Medicare Annual Wellness Visit  04/12/2025   Osteoporosis screening with Bone Density Scan  Completed   Meningitis B Vaccine  Aged Out   Flu Shot  Discontinued   Hepatitis C Screening  Discontinued   Cologuard (Stool DNA test)  Discontinued       04/12/2024   11:08 AM  Advanced Directives  Does Patient Have a Medical Advance Directive? Yes  Type of Estate Agent of [place];Living will  Does patient want to make changes to medical advance directive? Yes (Inpatient - patient requests chaplain consult to change a medical advance directive)  Copy of Healthcare Power of Attorney in Chart? No - copy requested    Vision: Annual vision screenings are recommended for early detection of glaucoma, cataracts, and diabetic retinopathy. These exams can also reveal signs of chronic conditions such as diabetes and high blood pressure.  Dental: Annual  dental screenings help detect early signs of oral cancer, gum disease, and other conditions linked to overall health, including heart disease and diabetes.

## 2024-04-20 ENCOUNTER — Ambulatory Visit: Payer: Self-pay

## 2024-04-21 ENCOUNTER — Ambulatory Visit: Admitting: Family Medicine

## 2024-04-21 ENCOUNTER — Encounter: Payer: Self-pay | Admitting: Family Medicine

## 2024-04-21 VITALS — BP 116/66 | HR 71 | Temp 98.5°F | Ht 62.0 in | Wt 86.8 lb

## 2024-04-21 DIAGNOSIS — R410 Disorientation, unspecified: Secondary | ICD-10-CM | POA: Diagnosis not present

## 2024-04-21 DIAGNOSIS — E673 Hypervitaminosis D: Secondary | ICD-10-CM

## 2024-04-21 DIAGNOSIS — M8000XD Age-related osteoporosis with current pathological fracture, unspecified site, subsequent encounter for fracture with routine healing: Secondary | ICD-10-CM | POA: Diagnosis not present

## 2024-04-21 NOTE — Patient Instructions (Addendum)
 Recommend taking plain calcium (without vitamin D )  STOP the Bone Up supplementation for now.   I have ordered a bone density test for you today. Someone will be reaching out to get you scheduled for this.   We will recheck labs in about a month to re evaluate your vitamin D .

## 2024-04-21 NOTE — Progress Notes (Signed)
 "  Acute Office Visit  Subjective:     Patient ID: Tricia [name], female    DOB: 15-Jan-1946, 79 y.o.   MRN: [id number]  No chief complaint on file.   HPI  Discussed the use of AI scribe software for clinical note transcription with the patient, who gave verbal consent to proceed.  History of Present Illness Mrs. Tricia [name] is a 79 year old female with osteoporosis who presents for evaluation of elevated vitamin D  levels and bone health management. Presents alone today  Vitamin d  supplementation and hypervitaminosis d - Elevated vitamin D  levels since November - Takes six daily tablets of 'Bone Up' supplement containing vitamin D3 and K2 - Takes additional calcium supplement at night, uncertain if it contains vitamin D  - Cautious to avoid supplements with extra vitamin D   Osteoporosis and fracture history - Diagnosed osteoporosis - Sustained two recent fractures - Last bone density scan in 2019; prior scan approximately 19 years earlier - Concerned about bone health and risk of further fractures - No recent intravenous osteoporosis treatments such as Evenity or Reclast  Musculoskeletal pain - Muscular pain at the top of the leg, worsened by prolonged sitting - Associates pain with recent falls and surgeries - Uses magnesium  malate for symptom relief  Calcium intake and dietary restrictions - 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  Constitutional:      General: She is not in acute distress.     Comments: underweight  HENT:     Head: Normocephalic and atraumatic.     Right Ear: External ear normal.     Left Ear: External ear normal.     Nose: Nose normal.     Mouth/Throat:     Mouth: Mucous membranes are moist.     Pharynx: Oropharynx is clear.  Eyes:     Extraocular Movements: Extraocular movements intact.     Pupils: Pupils are equal, round, and reactive to light.  Cardiovascular:     Rate and Rhythm: Normal rate and regular rhythm.     Pulses: Normal pulses.     Heart sounds: Normal heart sounds.  Pulmonary:     Effort: Pulmonary effort is normal. No respiratory distress.     Breath sounds: Normal breath sounds. No wheezing, rhonchi or rales.  Musculoskeletal:        General: Normal range of motion.     Cervical back: Normal range of motion.     Right lower leg: No edema.     Left lower leg: No edema.  Lymphadenopathy:     Cervical: No cervical adenopathy.  Neurological:     General: No focal deficit present.     Mental Status: She is oriented to person, place, and time. She is confused.     Comments: Mild confusion, difficulty with word finding, does not remember meeting me at our last appt on 03/07/24  Psychiatric:  Mood and Affect: Mood normal.        Thought Content: Thought content normal.     No results found for any visits on 04/21/24.      Assessment & Plan:   Assessment and Plan Assessment & Plan Age-related osteoporosis without current pathological fracture Osteoporosis with recent fractures. Previous bone density scan in 2019 showed poor bone density. Discussed potential treatments including IV infusions like Evenity or Reclast. - Ordered bone density scan. - Will discuss potential IV infusions for bone health after bone density results. - chronic, currently healing L wrist fracture  Hypervitaminosis D Elevated vitamin D  levels likely due to supplementation with Bone Up. Advised to stop Bone Up to reduce vitamin D  levels. Discussed  alternative calcium supplements without vitamin D . - Stopped Bone Up supplement. - Checked vitamin D  levels in one month. - Use plain calcium supplement without vitamin D .  Confusion - Reports some trouble finding words - Does not remember meeting me at her last visit on 03/07/2024 - friend drove her here today, reports that she does have support at home - is aware of confusion, states today is worse than usual, but she does not feel that this is progressive     Orders Placed This Encounter  Procedures   DG Bone Density    Standing Status:   Future    Expiration Date:   04/21/2025    Reason for Exam (SYMPTOM  OR DIAGNOSIS REQUIRED):   osteoporosis with pathologic fracture    Preferred imaging location?:   [place]     No orders of the defined types were placed in this encounter.   Return in about 4 weeks (around 05/19/2024) for labs, vitamin D .  Tricia LITTIE Ku, FNP  "

## 2024-05-19 ENCOUNTER — Ambulatory Visit: Admitting: Family Medicine

## 2024-05-24 ENCOUNTER — Ambulatory Visit: Admitting: Family Medicine

## 2024-06-30 ENCOUNTER — Encounter: Admitting: Family Medicine

## 2024-08-19 ENCOUNTER — Inpatient Hospital Stay: Admission: RE | Admit: 2024-08-19 | Source: Ambulatory Visit

## 2024-10-19 ENCOUNTER — Ambulatory Visit: Admitting: Physician Assistant

## 2025-04-18 ENCOUNTER — Ambulatory Visit
